# Patient Record
Sex: Male | Born: 1935 | Race: White | Hispanic: No | Smoking: Never smoker
Health system: Southern US, Community
[De-identification: ages and names within clinical notes are randomized; demographics above are authoritative.]

## PROBLEM LIST (undated history)

## (undated) DIAGNOSIS — G629 Polyneuropathy, unspecified: Secondary | ICD-10-CM

## (undated) DIAGNOSIS — IMO0002 Reserved for concepts with insufficient information to code with codable children: Secondary | ICD-10-CM

## (undated) DIAGNOSIS — E7800 Pure hypercholesterolemia, unspecified: Secondary | ICD-10-CM

## (undated) DIAGNOSIS — K219 Gastro-esophageal reflux disease without esophagitis: Secondary | ICD-10-CM

## (undated) DIAGNOSIS — K222 Esophageal obstruction: Secondary | ICD-10-CM

## (undated) DIAGNOSIS — N400 Enlarged prostate without lower urinary tract symptoms: Secondary | ICD-10-CM

## (undated) DIAGNOSIS — Z8719 Personal history of other diseases of the digestive system: Secondary | ICD-10-CM

## (undated) DIAGNOSIS — R42 Dizziness and giddiness: Secondary | ICD-10-CM

## (undated) DIAGNOSIS — C61 Malignant neoplasm of prostate: Secondary | ICD-10-CM

## (undated) DIAGNOSIS — M1990 Unspecified osteoarthritis, unspecified site: Secondary | ICD-10-CM

## (undated) DIAGNOSIS — M810 Age-related osteoporosis without current pathological fracture: Secondary | ICD-10-CM

## (undated) DIAGNOSIS — E559 Vitamin D deficiency, unspecified: Secondary | ICD-10-CM

## (undated) DIAGNOSIS — I482 Chronic atrial fibrillation, unspecified: Secondary | ICD-10-CM

## (undated) HISTORY — DX: Dizziness and giddiness: R42

## (undated) HISTORY — DX: Unspecified osteoarthritis, unspecified site: M19.90

## (undated) HISTORY — PX: OTHER SURGICAL HISTORY: SHX169

## (undated) HISTORY — PX: KNEE ARTHROCENTESIS: SUR44

## (undated) HISTORY — DX: Pure hypercholesterolemia, unspecified: E78.00

## (undated) HISTORY — DX: Reserved for concepts with insufficient information to code with codable children: IMO0002

## (undated) HISTORY — DX: Age-related osteoporosis without current pathological fracture: M81.0

## (undated) HISTORY — DX: Polyneuropathy, unspecified: G62.9

## (undated) HISTORY — DX: Benign prostatic hyperplasia without lower urinary tract symptoms: N40.0

## (undated) HISTORY — PX: TOTAL HIP ARTHROPLASTY: SHX124

## (undated) HISTORY — PX: INGUINAL HERNIA REPAIR: SUR1180

## (undated) HISTORY — DX: Vitamin D deficiency, unspecified: E55.9

## (undated) HISTORY — PX: CARDIAC CATHETERIZATION: SHX172

---

## 1997-12-25 ENCOUNTER — Inpatient Hospital Stay (HOSPITAL_COMMUNITY): Admission: RE | Admit: 1997-12-25 | Discharge: 1997-12-27 | Payer: Self-pay | Admitting: Orthopedic Surgery

## 1997-12-27 ENCOUNTER — Inpatient Hospital Stay (HOSPITAL_COMMUNITY)
Admission: RE | Admit: 1997-12-27 | Discharge: 1998-01-01 | Payer: Self-pay | Admitting: Physical Medicine and Rehabilitation

## 1998-11-26 ENCOUNTER — Ambulatory Visit (HOSPITAL_COMMUNITY): Admission: RE | Admit: 1998-11-26 | Discharge: 1998-11-26 | Payer: Self-pay | Admitting: Cardiology

## 1998-12-06 ENCOUNTER — Ambulatory Visit (HOSPITAL_BASED_OUTPATIENT_CLINIC_OR_DEPARTMENT_OTHER): Admission: RE | Admit: 1998-12-06 | Discharge: 1998-12-06 | Payer: Self-pay | Admitting: Otolaryngology

## 1999-03-30 ENCOUNTER — Ambulatory Visit (HOSPITAL_COMMUNITY): Admission: RE | Admit: 1999-03-30 | Discharge: 1999-03-30 | Payer: Self-pay | Admitting: *Deleted

## 2000-04-17 ENCOUNTER — Encounter: Admission: RE | Admit: 2000-04-17 | Discharge: 2000-04-17 | Payer: Self-pay | Admitting: *Deleted

## 2000-08-07 ENCOUNTER — Ambulatory Visit (HOSPITAL_COMMUNITY): Admission: RE | Admit: 2000-08-07 | Discharge: 2000-08-07 | Payer: Self-pay | Admitting: *Deleted

## 2000-08-07 ENCOUNTER — Encounter: Payer: Self-pay | Admitting: *Deleted

## 2001-04-09 ENCOUNTER — Encounter: Payer: Self-pay | Admitting: Orthopedic Surgery

## 2001-04-12 ENCOUNTER — Encounter: Payer: Self-pay | Admitting: Orthopedic Surgery

## 2001-04-12 ENCOUNTER — Inpatient Hospital Stay (HOSPITAL_COMMUNITY): Admission: RE | Admit: 2001-04-12 | Discharge: 2001-04-16 | Payer: Self-pay | Admitting: Orthopedic Surgery

## 2005-07-20 ENCOUNTER — Emergency Department (HOSPITAL_COMMUNITY): Admission: EM | Admit: 2005-07-20 | Discharge: 2005-07-20 | Payer: Self-pay | Admitting: Emergency Medicine

## 2005-11-19 ENCOUNTER — Ambulatory Visit (HOSPITAL_COMMUNITY): Admission: RE | Admit: 2005-11-19 | Discharge: 2005-11-19 | Payer: Self-pay | Admitting: *Deleted

## 2006-10-23 ENCOUNTER — Encounter: Admission: RE | Admit: 2006-10-23 | Discharge: 2006-10-23 | Payer: Self-pay | Admitting: Orthopaedic Surgery

## 2006-10-27 ENCOUNTER — Ambulatory Visit (HOSPITAL_BASED_OUTPATIENT_CLINIC_OR_DEPARTMENT_OTHER): Admission: RE | Admit: 2006-10-27 | Discharge: 2006-10-27 | Payer: Self-pay | Admitting: Orthopaedic Surgery

## 2008-05-05 HISTORY — PX: CHOLECYSTECTOMY: SHX55

## 2009-03-05 ENCOUNTER — Ambulatory Visit (HOSPITAL_COMMUNITY): Admission: RE | Admit: 2009-03-05 | Discharge: 2009-03-06 | Payer: Self-pay | Admitting: Urology

## 2009-03-05 ENCOUNTER — Encounter: Payer: Self-pay | Admitting: Urology

## 2009-12-09 ENCOUNTER — Inpatient Hospital Stay (HOSPITAL_COMMUNITY): Admission: EM | Admit: 2009-12-09 | Discharge: 2009-12-15 | Payer: Self-pay | Admitting: Emergency Medicine

## 2009-12-10 ENCOUNTER — Encounter (INDEPENDENT_AMBULATORY_CARE_PROVIDER_SITE_OTHER): Payer: Self-pay | Admitting: Cardiology

## 2009-12-12 ENCOUNTER — Encounter (INDEPENDENT_AMBULATORY_CARE_PROVIDER_SITE_OTHER): Payer: Self-pay | Admitting: Cardiology

## 2010-07-19 LAB — CBC
Hemoglobin: 11.3 g/dL — ABNORMAL LOW (ref 13.0–17.0)
Hemoglobin: 13.6 g/dL (ref 13.0–17.0)
Hemoglobin: 14.7 g/dL (ref 13.0–17.0)
MCH: 32.6 pg (ref 26.0–34.0)
MCH: 32.7 pg (ref 26.0–34.0)
MCHC: 33.2 g/dL (ref 30.0–36.0)
MCHC: 33.6 g/dL (ref 30.0–36.0)
MCHC: 33.8 g/dL (ref 30.0–36.0)
MCV: 97.7 fL (ref 78.0–100.0)
MCV: 98.1 fL (ref 78.0–100.0)
MCV: 98.8 fL (ref 78.0–100.0)
Platelets: 155 10*3/uL (ref 150–400)
Platelets: 188 10*3/uL (ref 150–400)
Platelets: 213 10*3/uL (ref 150–400)
RBC: 3.45 MIL/uL — ABNORMAL LOW (ref 4.22–5.81)
RBC: 3.71 MIL/uL — ABNORMAL LOW (ref 4.22–5.81)
RBC: 4.16 MIL/uL — ABNORMAL LOW (ref 4.22–5.81)
RBC: 4.33 MIL/uL (ref 4.22–5.81)
RDW: 14.1 % (ref 11.5–15.5)
RDW: 14.4 % (ref 11.5–15.5)
WBC: 11.2 10*3/uL — ABNORMAL HIGH (ref 4.0–10.5)
WBC: 16.1 10*3/uL — ABNORMAL HIGH (ref 4.0–10.5)
WBC: 19.4 10*3/uL — ABNORMAL HIGH (ref 4.0–10.5)
WBC: 23.8 10*3/uL — ABNORMAL HIGH (ref 4.0–10.5)

## 2010-07-19 LAB — POCT CARDIAC MARKERS
CKMB, poc: 2.3 ng/mL (ref 1.0–8.0)
Myoglobin, poc: 159 ng/mL (ref 12–200)
Troponin i, poc: <0.05 ng/mL (ref 0.00–0.09)

## 2010-07-19 LAB — DIFFERENTIAL
Basophils Absolute: 0 10*3/uL (ref 0.0–0.1)
Basophils Absolute: 0 10*3/uL (ref 0.0–0.1)
Basophils Relative: 0 % (ref 0–1)
Basophils Relative: 0 % (ref 0–1)
Basophils Relative: 0 % (ref 0–1)
Eosinophils Absolute: 0 10*3/uL (ref 0.0–0.7)
Eosinophils Absolute: 0.1 10*3/uL (ref 0.0–0.7)
Eosinophils Absolute: 0.1 10*3/uL (ref 0.0–0.7)
Eosinophils Relative: 0 % (ref 0–5)
Eosinophils Relative: 1 % (ref 0–5)
Eosinophils Relative: 1 % (ref 0–5)
Lymphocytes Relative: 12 % (ref 12–46)
Lymphocytes Relative: 13 % (ref 12–46)
Lymphocytes Relative: 7 % — ABNORMAL LOW (ref 12–46)
Lymphs Abs: 1.4 10*3/uL (ref 0.7–4.0)
Lymphs Abs: 1.7 10*3/uL (ref 0.7–4.0)
Monocytes Absolute: 1.4 10*3/uL — ABNORMAL HIGH (ref 0.1–1.0)
Monocytes Absolute: 1.5 10*3/uL — ABNORMAL HIGH (ref 0.1–1.0)
Monocytes Relative: 8 % (ref 3–12)
Monocytes Relative: 9 % (ref 3–12)
Neutro Abs: 16.4 10*3/uL — ABNORMAL HIGH (ref 1.7–7.7)
Neutro Abs: 20 10*3/uL — ABNORMAL HIGH (ref 1.7–7.7)
Neutro Abs: 8.7 10*3/uL — ABNORMAL HIGH (ref 1.7–7.7)
Neutrophils Relative %: 84 % — ABNORMAL HIGH (ref 43–77)
Neutrophils Relative %: 84 % — ABNORMAL HIGH (ref 43–77)

## 2010-07-19 LAB — BASIC METABOLIC PANEL
Chloride: 101 mEq/L (ref 96–112)
GFR calc non Af Amer: >60 mL/min (ref >60)
Glucose, Bld: 144 mg/dL — ABNORMAL HIGH (ref 70–99)
Potassium: 3.6 mEq/L (ref 3.5–5.1)
Sodium: 139 mEq/L (ref 135–145)

## 2010-07-19 LAB — COMPREHENSIVE METABOLIC PANEL
ALT: 34 U/L (ref 0–53)
ALT: 36 U/L (ref 0–53)
AST: 40 U/L — ABNORMAL HIGH (ref 0–37)
Albumin: 2.3 g/dL — ABNORMAL LOW (ref 3.5–5.2)
Albumin: 2.7 g/dL — ABNORMAL LOW (ref 3.5–5.2)
Alkaline Phosphatase: 66 U/L (ref 39–117)
Alkaline Phosphatase: 87 U/L (ref 39–117)
BUN: 13 mg/dL (ref 6–23)
BUN: 16 mg/dL (ref 6–23)
CO2: 27 mEq/L (ref 19–32)
Calcium: 8.2 mg/dL — ABNORMAL LOW (ref 8.4–10.5)
Calcium: 8.9 mg/dL (ref 8.4–10.5)
Chloride: 100 mEq/L (ref 96–112)
Chloride: 106 mEq/L (ref 96–112)
Creatinine, Ser: 1.13 mg/dL (ref 0.4–1.5)
Creatinine, Ser: 1.29 mg/dL (ref 0.4–1.5)
GFR calc Af Amer: >60 mL/min (ref >60)
GFR calc Af Amer: >60 mL/min (ref >60)
GFR calc non Af Amer: 53 mL/min — ABNORMAL LOW (ref >60)
GFR calc non Af Amer: >60 mL/min (ref >60)
Glucose, Bld: 107 mg/dL — ABNORMAL HIGH (ref 70–99)
Potassium: 3.3 mEq/L — ABNORMAL LOW (ref 3.5–5.1)
Potassium: 3.4 mEq/L — ABNORMAL LOW (ref 3.5–5.1)
Sodium: 133 mEq/L — ABNORMAL LOW (ref 135–145)
Sodium: 139 mEq/L (ref 135–145)
Sodium: 142 mEq/L (ref 135–145)
Total Bilirubin: 0.5 mg/dL (ref 0.3–1.2)
Total Bilirubin: 1.9 mg/dL — ABNORMAL HIGH (ref 0.3–1.2)
Total Protein: 7 g/dL (ref 6.0–8.3)

## 2010-07-19 LAB — CARDIAC PANEL(CRET KIN+CKTOT+MB+TROPI)
CK, MB: 4.2 ng/mL — ABNORMAL HIGH (ref 0.3–4.0)
Troponin I: 0.03 ng/mL (ref 0.00–0.06)
Troponin I: 0.03 ng/mL (ref 0.00–0.06)

## 2010-07-19 LAB — PROTIME-INR
INR: 1.48 (ref 0.00–1.49)
INR: 1.89 — ABNORMAL HIGH (ref 0.00–1.49)
INR: 2.56 — ABNORMAL HIGH (ref 0.00–1.49)
Prothrombin Time: 16.8 seconds — ABNORMAL HIGH (ref 11.6–15.2)
Prothrombin Time: 34.5 seconds — ABNORMAL HIGH (ref 11.6–15.2)

## 2010-07-19 LAB — HEPATIC FUNCTION PANEL
Albumin: 3.4 g/dL — ABNORMAL LOW (ref 3.5–5.2)
Bilirubin, Direct: 0.1 mg/dL (ref 0.0–0.3)
Total Bilirubin: 0.6 mg/dL (ref 0.3–1.2)

## 2010-07-19 LAB — CK TOTAL AND CKMB (NOT AT ARMC): CK, MB: 4.5 ng/mL — ABNORMAL HIGH (ref 0.3–4.0)

## 2010-07-19 LAB — HEMOGLOBIN A1C
Hgb A1c MFr Bld: 6.2 % — ABNORMAL HIGH (ref <5.7)
Mean Plasma Glucose: 131 mg/dL — ABNORMAL HIGH (ref <117)

## 2010-07-19 LAB — LIPASE, BLOOD: Lipase: 24 U/L (ref 11–59)

## 2010-08-07 LAB — APTT: aPTT: 26 seconds (ref 24–37)

## 2010-08-07 LAB — PROTIME-INR: INR: 1.13 (ref 0.00–1.49)

## 2010-08-08 LAB — CBC
HCT: 43.2 % (ref 39.0–52.0)
Hemoglobin: 14.7 g/dL (ref 13.0–17.0)
MCHC: 34 g/dL (ref 30.0–36.0)
RBC: 4.33 MIL/uL (ref 4.22–5.81)

## 2010-09-17 NOTE — Op Note (Signed)
[name], George [name]              ACCOUNT NO.:  [id number]   MEDICAL RECORD NO.:  [id number]          PATIENT TYPE:  AMB   LOCATION:  DSC                          FACILITY:  [facility]   PHYSICIAN:  Vanita Panda. Magnus Ivan, M.D.DATE OF BIRTH:  October 08, 1923   DATE OF PROCEDURE:  10/27/2006  DATE OF DISCHARGE:                               OPERATIVE REPORT   PREOPERATIVE DIAGNOSIS:  Recurrent right elbow olecranon bursitis with  prominent olecranon bone spurring.   POSTOPERATIVE DIAGNOSIS:  Recurrent right elbow olecranon bursitis with  prominent olecranon bone spurring.   PROCEDURE:  Right elbow olecranon bursectomy and excision of prominent  bone spur.   SURGEON:  Doneen Poisson, MD   ANESTHESIA:  1. Right arm Bier block.  2. IV sedation.   BLOOD LOSS:  Minimal.   COMPLICATIONS:  None.   INDICATIONS:  Briefly [name] is an 77 year old gentleman with  recurrent bursitis involving his right elbow.  He has had this drained  in the past and he still had a recurrence.  He has a prominent bone spur  in this area and he wished to proceed with operative intervention  because he did not want to continue to have this aspirated and it recur.  The risks, benefits surgery were explained and well understood.  He  agreed proceed with surgery.   PROCEDURE DESCRIPTION:  After informed consent obtained, appropriate  right arm was marked.  He was brought the operating room, placed supine  on operating table.  A tourniquet placed on his upper arm and Bier block  was obtained by anesthesia.  His arm was prepped and draped DuraPrep and  sterile drapes.  Prior to make an incision time-out was called to  identify the correct patient correct extremity.  I then pinched on the  arm and he did not feel this.  Incision made directly over the olecranon  bursa and bursal fluid was removed easily.  I then excised the bursa in  its entirety.  I did not dissect over to with the area of the ulnar  nerve.  I did remove prominent bone spur using a rongeur.  Once this was  done, the wound was copiously irrigated.  I closed the deep tissue with  interrupted 2-0 Vicryl suture followed by interrupted 3-0 nylon on the  skin.  Xeroform followed by well-padded sterile dressing was applied.  Prior to applying the dressing I did infiltrate the wound with 0.25%  Marcaine.  Again the sterile  dressing was applied.  The tourniquet was let down at 30 minutes of  tourniquet time total, the fingers did pink nicely.  There no  complications noted.  The patient was taken recovery room stable  condition.  Follow-up in office in 2 weeks and to remove the dressing in  2 to 3 days.      Vanita Panda. Magnus Ivan, M.D.  Electronically Signed     CYB/MEDQ  D:  10/27/2006  T:  10/28/2006  Job:  [id number]

## 2010-09-20 NOTE — H&P (Signed)
[facility]  Patient:    George [name], George [name] Visit Number: [id number] MRN: [id number]          Service Type: Attending:  Ollen Gross, M.D. Dictated by:   Ralene Bathe, P.A. Adm. Date:  04/12/01                           History and Physical  DATE OF BIRTH:  2023/12/08  CHIEF COMPLAINT:  Right hip pain.  HISTORY OF PRESENT ILLNESS:  Mr. Latin is a 77 year old male who is well-known to our practice.  He had previously undergone left total hip arthroplasty two to three years ago.  He has progressed well with that, however, has had increasing discomfort into his right hip.  He is having difficulty ambulating and is having rest pain as well as start up pain.  He wishes to proceed with right total hip arthroplasty at this point.  Physical examination shows significant decrease in range of motion, markedly antalgic gait, and about 3/4 inch right leg shortening when compared to left, with x-rays demonstrating significant degenerative changes with large inferior osteophytes and joint space narrowing.  At this time he wishes to proceed with total hip arthroplasty as indicated.  PAST MEDICAL HISTORY: 1. History of intermittent atrial fibrillation on chronic Coumadin.  He is    stopping five days preoperative. 2. History of gastrointestinal bleed.  PAST SURGICAL HISTORY: 1. Lumbar surgery. 2. Right knee arthroscopy. 3. Left total hip arthroplasty in 1999.  ALLERGIES:  ASPIRIN intolerance and NSAID intolerance due to GI complications.  MEDICATIONS: 1. Zantac 150 mg b.i.d. 2. Lipitor 10 mg every-other-day 3. Coumadin 5 mg q.d. 4. Tylenol p.r.n.  FAMILY HISTORY:  Significant for father with cancer.  SOCIAL HISTORY:  He does not drink nor smoke.  He lives alone.  He did stay in rehabilitation unit postoperatively after his last hip.  PRIMARY CARE PHYSICIAN:  Dr. Lilia Pro, M.D.  Dr. Johnsie Kindred wrote and gave medical clearance for the  procedure.  CARDIOLOGIST:  Aram Candela. Aleen Campi, M.D.  REVIEW OF SYSTEMS:  GENERAL:  The patient denies any recent fevers or chills, night sweats, bleeding tendencies.  CNS:  No blurred or double vision, seizure, or paralysis.  RESPIRATORY:  No shortness of breath, productive cough, hemoptysis.  CARDIOVASCULAR:  No chest pain, angina, orthopnea.  His last atrial fibrillation episode was around three months ago.  GI:  No nausea or vomiting, constipation, melena, or bloody stools.  GU:  No dysuria, hematuria.  MUSCULOSKELETAL:  As pertinent in present illness.  PHYSICAL EXAMINATION:  VITAL SIGNS:  Pulse 72, respirations 12, and blood pressure 152/72.  GENERAL:  Well-developed, well-nourished, 77 year old male who walks with antalgic gait.  HEENT:  Normocephalic.  Extraocular muscles are intact.  NECK:  Supple with no lymphadenopathy or carotid bruits.  LUNGS:  Clear to auscultation.  No rales or rhonchi.  HEART:  Regular rate and rhythm today on exam.  No murmurs appreciated. ABDOMEN:  Soft and nontender.  EXTREMITIES:  As in history of present illness.  Pulses neurovascularly intact in bilateral lower extremities and no pitting edema noted.  IMPRESSION: 1. End-stage osteoarthritis of right hip. 2. History of intermittent atrial fibrillation. 3. History of gastrointestinal bleed, remote.  PLAN:  He will stop his Coumadin five days preoperatively in preparation his surgery.  Dr. Lilia Pro, M.D. is his medical physician and will call her and alert her about his admission.  Will call her if needed.  Will  get a rehab consult on his as well postoperatively. Dictated by:   Ralene Bathe, P.A. Attending:  Ollen Gross, M.D. DD:  04/06/01 TD:  04/06/01 Job: [id number]

## 2010-09-20 NOTE — Discharge Summary (Signed)
[facility]  Patient:    George [name], George [name] Visit Number: [id number] MRN: [id number]          Service Type: SUR Location: 4W 0482 01 Attending Physician:  Loanne Drilling Dictated by:   Dorie Rank, P.A. Admit Date:  04/12/2001 Discharge Date: 04/16/2001                             Discharge Summary  ADMISSION DIAGNOSES: 1. Osteoarthritis to the right hip. 2. History of intermittent atrial fibrillation. 3. History of gastrointestinal bleed, remote.  DISCHARGE DIAGNOSES: 1. History of osteoarthritis, right hip, status post right total hip    arthroplasty. 2. Postoperative hemorrhagic anemia, stable. 3. History of intermittent atrial fibrillation. 4. History of remote gastrointestinal bleed.  PROCEDURE:  On April 12, 2001, the patient underwent a right total hip arthroplasty.  Surgeon was Dr. Ollen Gross, assistant Haze Rushing, P.A.-C. Anesthesia spinal with no complications.  CONSULTATION:  Physical medicine and rehabilitation.  HISTORY OF PRESENT ILLNESS:  George [name] is a pleasant, 77 year old male well-known to our practice who had previously undergone a left total hip arthroplasty two to three years ago.  He has progressed well with that, however, he has had increasing discomfort to his right hip.  He was having difficulty with ambulation and was noted in the office to be having pain at rest as well.  On exam, he had significant decrease in range of motion with marked antalgic gait with about 3/4-inch right leg shortening when compared to the left.  Radiographs in the office demonstrated significant degenerative changes with large inferior osteophytes and joint space narrowing.  It was felt that he would benefit from undergoing a total hip arthroplasty.  The risks and benefits, as well as the procedure, were discussed with the patient and he agreed to proceed.  HOSPITAL COURSE:  The patient had the above-stated surgery on April 12, 2001, without difficulty.  While in the operating room, a Hemovac drain was placed into the incision.  This was discontinued on postop day #1 without difficulty.  The wound was dressed in a sterile fashion while in the operating room.  It was clean, dry and intact on postop day #1.  On postop day #2, it was changed and daily thereafter.  It was found to be free of any erythema or drainage.  The patient resumed his Coumadin therapy for DVT prophylaxis as well as his chronic atrial fibrillation.  This was dosed and monitored by the pharmacy throughout his hospital stay.  Appropriate IV antibiotics were used postoperatively for infection control.  While in the operating room, a Foley catheter was placed into the bladder.  This was discontinued.  On postop day #2, he was voiding without difficulty throughout the remainder of his hospital stay.  Postoperatively, H&H were checked daily for three days and found to be stable.  BMET was checked on postop day #1 and found to be stable.  He was placed on a partial weightbearing status to the right lower extremity.  He progressed well with physical therapy and occupational therapy.  He was able to maintain this weightbearing status.  His home medications of Zantac and Lipitor were resumed.  He tolerated p.o. intake postoperatively.  He did have bowel movements prior to discharge.  He utilized knee high TED hose as well for DVT prophylaxis.  Rehabilitation consult was obtained for a possible transfer to rehabilitation, however, he progressed so well with physical  therapy, it was felt he would be stable for discharge home.  Initially, the patient utilized PCA and morphine.  However, on postop day #2, he was utilizing p.o. pain medications throughout the remainder of his hospital stay. On April 16, 2001, he was felt to be medically and orthopedically stable for discharge.  LABORATORY DATA AND X-RAY FINDINGS:  Preop H&H on April 09, 2001, was  14.6 and 42.8 respectively.  On April 14, 2001, hemoglobin 10.9 and hematocrit 32.1.  On April 15, 2001, hemoglobin 11.4, hematocrit 33.5.  Coagulation studies within normal limits on April 12, 2001.  On April 14, 2001, PT 15.6, INR 1.3.  On April 16, 2001, PT 18.5, INR 1.8.  UA on April 09, 2001, was negative other than trace ketones.  Blood type on December 6 and December 9, was O positive.  On April 09, 2001, preop chest x-ray showed no acute disease.  Two-view hip x-ray on the right showed 0 degenerative arthritis of the right hip. Cardiology with no tracings available in the chart at the time of this dictation.  CONDITION ON DISCHARGE:  Improved.  SPECIAL INSTRUCTIONS:  The patient was to have home health PT with Genevieve Norlander as well as Coumadin therapy.  He was to have his protime on Monday with [facility] who was to fax this to Dr. Doreen Beam office who will manage his Coumadin with Genevieve Norlander doing the draws.  Shower on postop day #5.  ACTIVITY:  He will have 50% partial weightbearing to the right lower extremity.  Total joint precautions.  DIET:  Regular diet.  WOUND CARE:  Daily dressing changes.  DISCHARGE MEDICATIONS: 1. Coumadin, per pharmacy. 2. Percocet. 3. Robaxin. 4. Resume home medications. 5. Over-the-counter stool softeners and Dulcolax as needed for constipation.  FOLLOWUP:  Follow up in two weeks from surgery with Dr. Lequita Halt, call for appointment. Dictated by:   Dorie Rank, P.A. Attending Physician:  Loanne Drilling DD:  05/12/01 TD:  05/12/01 Job: [id number]

## 2010-09-20 NOTE — Op Note (Signed)
George [name], George [name]              ACCOUNT NO.:  [id number]   MEDICAL RECORD NO.:  [id number]          PATIENT TYPE:  AMB   LOCATION:  ENDO                         FACILITY:  [facility]   PHYSICIAN:  Georgiana Spinner, M.D.    DATE OF BIRTH:  1924/05/03   DATE OF PROCEDURE:  11/19/2005  DATE OF DISCHARGE:                                 OPERATIVE REPORT   PROCEDURE:  Colonoscopy.   ENDOSCOPIST:  Georgiana Spinner, M.D.   INDICATIONS:  Colon cancer screening.   ANESTHESIA:  Fentanyl 50 mcg, Versed 4 mg.   PROCEDURE:  With the patient mildly sedated in the left lateral decubitus  position, the Olympus videoscopic colonoscope was inserted into the rectum  and passed under direct vision to the cecum, identified by ileocecal valve  and appendiceal orifice, both of which were photographed.  From this point,  the colonoscope was slowly withdrawn, taking circumferential views of  colonic mucosa, stopping to photograph diverticulosis seen in the sigmoid  colon, until we reached the rectum, which appeared normal on direct and  showed hemorrhoids on retroflexed view.  The endoscope was straightened and  withdrawn.  The patient's vital signs and pulse oximetry remained stable.  The patient tolerated the procedure well without apparent complications.   FINDINGS:  Internal hemorrhoids and diverticulosis of the sigmoid colon.  Otherwise, an unremarkable colonoscopic examination to cecum.   PLAN:  Consider repeat examination in 5-10 years.           ______________________________  Georgiana Spinner, M.D.     GMO/MEDQ  D:  11/19/2005  T:  11/19/2005  Job:  [id number]

## 2010-09-20 NOTE — Procedures (Signed)
[facility]  Patient:    George [name], George [name]                     MRN: [id number] Proc. Date: 08/07/00 Adm. Date:  [id number] Attending:  Sabino Gasser                           Procedure Report  PROCEDURE PERFORMED:  Endoscopy with dilation.  ENDOSCOPIST:  Sabino Gasser, M.D.  INDICATIONS FOR PROCEDURE:  Dysphagia.  ANESTHESIA:  Demerol 50 mg, Versed 3 mg.  DESCRIPTION OF PROCEDURE:  With the patient mildly sedated in the left lateral decubitus position, the Olympus video endoscope was inserted in the mouth and passed under direct vision through the esophagus, which appeared normal except for a distal stricture into a hiatal hernia sac, stomach, fundus, body, antrum and duodenal bulb visualized as was the second portion of the duodenum all of which appeared normal.  Photographs were taken.  From this point, the endoscope was slowly withdrawn taking circumferential views of the entire duodenal mucosa until the endoscope had been pulled back into the stomach and placed on retroflexion to view the stomach from below and a hiatal hernia was seen and photographed.  The endoscope was then straightened and a guide wire was passed.  The endoscope was removed taking circumferential views of the remaining gastric and esophageal mucosa.  Subsequently, over the guide wire with fluoroscopic control, Savary 14, 16, and 18 dilators were passed easily. ____________ The guide wire was removed.  The endoscope was reinserted and there was some bleeding seen as to be expected.  The endoscope was then withdrawn.  The patients vital signs and pulse oximeter remained stable.  The patient tolerated the procedure well without apparent complications.  FINDINGS:  Stricture of the distal esophagus above a hiatal hernia.  Await clinical  response to therapy.  Have patient follow up with me as an outpatient.  Clear liquids today, advance diet tomorrow.DD:  08/07/00 TD:  08/07/00 Job:  [id number]

## 2010-09-20 NOTE — Op Note (Signed)
[facility]  Patient:    JAYKO, VOORHEES Visit Number: [id number] MRN: [id number]          Service Type: SUR Location: 4W 6213 01 Attending Physician:  Loanne Drilling Dictated by:   Ollen Gross, M.D. Proc. Date: 04/12/01 Admit Date:  04/12/2001                             Operative Report  PREOPERATIVE DIAGNOSIS:  Osteoarthritis, right hip.  POSTOPERATIVE DIAGNOSIS:  Osteoarthritis, right hip.  PROCEDURE:  Right total hip arthroplasty.  SURGEON:  Ollen Gross, M.D.  ASSISTANT:  Marcie Bal. Troncale, P.A.-C.  ANESTHESIA:  Spinal.  ESTIMATED BLOOD LOSS:  300 cc.  DRAIN:  Hemovac x 1.  COMPLICATIONS:  None.  CONDITION:  Stable to recovery.  BRIEF CLINICAL NOTE:  Mr. Bufkin is a 77 year old male with severe osteoarthritis of the right hip with pain refractory to nonoperative management. He presents now for right total hip arthroplasty.  PROCEDURE IN DETAIL:  After successful administration of spinal anesthetic, the patient was placed in a left lateral decubitus position with the right side up and held with the hip positioner. Right lower extremity is isolated from its perineum-plastic drapes, and prepped and draped in the usual sterile fashion. Standard _______ incision was made. Skin cut with #10 blade through the subcutaneous tissue to the level of fascia lata which was incised in line with the skin incision. Short external rotators were isolated off the femur after the sciatic nerve was palpated and protected. Capsulectomy was performed, hip dislocated, and the center of the femoral hear marked. Trial prosthesis was placed such that the center of the trial head corresponded to the level of the center of his native femoral head. Osteotomy line was then marked on the femoral neck and osteotomy made with an oscillating saw.  Femur was retracted anteriorly, the anterior capsule was removed, and the acetabular exposure obtained. He  had a lot of osteophytes to remove and in reaming was initiated with a 51, coursing increments of 2 at the 55, and at the 56 mm Pinnacle acetabular shell was placed. It was transfixed with two dome screws. Trial 32-mm neutral +4 liner was placed.  Femoral preparation was initiated, first with the canal finder, and then the canal was irrigated. Broaching was started with size 1 coursing up to a size 3. The size 3 high offset stem trial was placed with a 32+0 head. Hip was reduced and he had outstanding stability, full extension, full external rotation, 70 degrees flexion, 40 degrees adduction, 90 degrees internal rotation, 90 degrees of flexion, and 90 degrees of internal rotation. In order to equalize his leg length better _______ plating, it was decided to use a 32+5 head. This trial was placed with the same stability parameters and leg lengths were more equalized.  Trial components were removed and a permanent apex hole eliminator was placed into the acetabular shell. Permanent 32-mm neutral +4 liner was placed into the shell. The canal was irrigated. The cement restrictor was placed at the appropriate level and then the canal prepared with pulsatile lavage. Cement was mixed and once ready for implantation was injected into the canal and pressurized. The size 3 Endurance Luster high offset stem was placed in approximately 20 degrees of anteversion. Once the cement was fully hardened, a permanent 32+5 head was placed, hip reduced with the same stability parameters. Wound was copiously irrigated with antibiotic solution and short  external rotators were reattached to the femur through drill holes. The fascia lata was closed over the Hemovac drain with interrupted #1 Vicryl, subcutaneous closed in interrupted #1 and interrupted 2-0 Vicryl, and subcuticular running 4-0 Monocryl. The incision was cleaned and dried and Steri-Strips and a bulky sterile dressing applied. Drain was hooked  to suction. The patient was awakened, transported to recovery in stable condition. Dictated by:   Ollen Gross, M.D. Attending Physician:  Loanne Drilling DD:  04/12/01 TD:  04/12/01 Job: [id number]

## 2011-02-19 LAB — PROTIME-INR: INR: 1

## 2011-02-19 LAB — POCT HEMOGLOBIN-HEMACUE: Hemoglobin: 14.5

## 2011-02-19 LAB — APTT: aPTT: 27

## 2011-05-09 DIAGNOSIS — I4891 Unspecified atrial fibrillation: Secondary | ICD-10-CM | POA: Diagnosis not present

## 2011-05-09 DIAGNOSIS — E785 Hyperlipidemia, unspecified: Secondary | ICD-10-CM | POA: Diagnosis not present

## 2011-05-09 DIAGNOSIS — Z7901 Long term (current) use of anticoagulants: Secondary | ICD-10-CM | POA: Diagnosis not present

## 2011-05-09 DIAGNOSIS — Z8546 Personal history of malignant neoplasm of prostate: Secondary | ICD-10-CM | POA: Diagnosis not present

## 2011-05-14 DIAGNOSIS — M76899 Other specified enthesopathies of unspecified lower limb, excluding foot: Secondary | ICD-10-CM | POA: Diagnosis not present

## 2011-05-14 DIAGNOSIS — M1710 Unilateral primary osteoarthritis, unspecified knee: Secondary | ICD-10-CM | POA: Diagnosis not present

## 2011-05-22 DIAGNOSIS — L821 Other seborrheic keratosis: Secondary | ICD-10-CM | POA: Diagnosis not present

## 2011-05-22 DIAGNOSIS — L570 Actinic keratosis: Secondary | ICD-10-CM | POA: Diagnosis not present

## 2011-05-22 DIAGNOSIS — L738 Other specified follicular disorders: Secondary | ICD-10-CM | POA: Diagnosis not present

## 2011-05-22 DIAGNOSIS — L219 Seborrheic dermatitis, unspecified: Secondary | ICD-10-CM | POA: Diagnosis not present

## 2011-06-06 DIAGNOSIS — Z8546 Personal history of malignant neoplasm of prostate: Secondary | ICD-10-CM | POA: Diagnosis not present

## 2011-06-06 DIAGNOSIS — I4891 Unspecified atrial fibrillation: Secondary | ICD-10-CM | POA: Diagnosis not present

## 2011-06-06 DIAGNOSIS — E785 Hyperlipidemia, unspecified: Secondary | ICD-10-CM | POA: Diagnosis not present

## 2011-06-06 DIAGNOSIS — Z7901 Long term (current) use of anticoagulants: Secondary | ICD-10-CM | POA: Diagnosis not present

## 2011-07-03 DIAGNOSIS — M76899 Other specified enthesopathies of unspecified lower limb, excluding foot: Secondary | ICD-10-CM | POA: Diagnosis not present

## 2011-07-07 DIAGNOSIS — E785 Hyperlipidemia, unspecified: Secondary | ICD-10-CM | POA: Diagnosis not present

## 2011-07-07 DIAGNOSIS — I4891 Unspecified atrial fibrillation: Secondary | ICD-10-CM | POA: Diagnosis not present

## 2011-07-07 DIAGNOSIS — Z7901 Long term (current) use of anticoagulants: Secondary | ICD-10-CM | POA: Diagnosis not present

## 2011-07-07 DIAGNOSIS — Z8546 Personal history of malignant neoplasm of prostate: Secondary | ICD-10-CM | POA: Diagnosis not present

## 2011-08-06 DIAGNOSIS — I4891 Unspecified atrial fibrillation: Secondary | ICD-10-CM | POA: Diagnosis not present

## 2011-08-06 DIAGNOSIS — E785 Hyperlipidemia, unspecified: Secondary | ICD-10-CM | POA: Diagnosis not present

## 2011-08-06 DIAGNOSIS — Z8546 Personal history of malignant neoplasm of prostate: Secondary | ICD-10-CM | POA: Diagnosis not present

## 2011-08-06 DIAGNOSIS — Z7901 Long term (current) use of anticoagulants: Secondary | ICD-10-CM | POA: Diagnosis not present

## 2011-08-19 DIAGNOSIS — M1710 Unilateral primary osteoarthritis, unspecified knee: Secondary | ICD-10-CM | POA: Diagnosis not present

## 2011-08-20 DIAGNOSIS — Z8546 Personal history of malignant neoplasm of prostate: Secondary | ICD-10-CM | POA: Diagnosis not present

## 2011-08-20 DIAGNOSIS — E785 Hyperlipidemia, unspecified: Secondary | ICD-10-CM | POA: Diagnosis not present

## 2011-08-20 DIAGNOSIS — Z7901 Long term (current) use of anticoagulants: Secondary | ICD-10-CM | POA: Diagnosis not present

## 2011-08-20 DIAGNOSIS — I4891 Unspecified atrial fibrillation: Secondary | ICD-10-CM | POA: Diagnosis not present

## 2011-09-09 DIAGNOSIS — R5383 Other fatigue: Secondary | ICD-10-CM | POA: Diagnosis not present

## 2011-09-09 DIAGNOSIS — I4891 Unspecified atrial fibrillation: Secondary | ICD-10-CM | POA: Diagnosis not present

## 2011-09-09 DIAGNOSIS — Z0000 Encounter for general adult medical examination without abnormal findings: Secondary | ICD-10-CM | POA: Diagnosis not present

## 2011-09-09 DIAGNOSIS — M159 Polyosteoarthritis, unspecified: Secondary | ICD-10-CM | POA: Diagnosis not present

## 2011-09-09 DIAGNOSIS — E119 Type 2 diabetes mellitus without complications: Secondary | ICD-10-CM | POA: Diagnosis not present

## 2011-09-09 DIAGNOSIS — E782 Mixed hyperlipidemia: Secondary | ICD-10-CM | POA: Diagnosis not present

## 2011-09-09 DIAGNOSIS — R5381 Other malaise: Secondary | ICD-10-CM | POA: Diagnosis not present

## 2011-09-16 DIAGNOSIS — G609 Hereditary and idiopathic neuropathy, unspecified: Secondary | ICD-10-CM | POA: Diagnosis not present

## 2011-09-16 DIAGNOSIS — J45901 Unspecified asthma with (acute) exacerbation: Secondary | ICD-10-CM | POA: Diagnosis not present

## 2011-09-16 DIAGNOSIS — H908 Mixed conductive and sensorineural hearing loss, unspecified: Secondary | ICD-10-CM | POA: Diagnosis not present

## 2011-09-16 DIAGNOSIS — E782 Mixed hyperlipidemia: Secondary | ICD-10-CM | POA: Diagnosis not present

## 2011-09-16 DIAGNOSIS — I4891 Unspecified atrial fibrillation: Secondary | ICD-10-CM | POA: Diagnosis not present

## 2011-09-16 DIAGNOSIS — M159 Polyosteoarthritis, unspecified: Secondary | ICD-10-CM | POA: Diagnosis not present

## 2011-09-16 DIAGNOSIS — J45909 Unspecified asthma, uncomplicated: Secondary | ICD-10-CM | POA: Diagnosis not present

## 2011-09-17 DIAGNOSIS — Z8546 Personal history of malignant neoplasm of prostate: Secondary | ICD-10-CM | POA: Diagnosis not present

## 2011-09-17 DIAGNOSIS — E785 Hyperlipidemia, unspecified: Secondary | ICD-10-CM | POA: Diagnosis not present

## 2011-09-17 DIAGNOSIS — I4891 Unspecified atrial fibrillation: Secondary | ICD-10-CM | POA: Diagnosis not present

## 2011-09-17 DIAGNOSIS — Z7901 Long term (current) use of anticoagulants: Secondary | ICD-10-CM | POA: Diagnosis not present

## 2011-09-19 DIAGNOSIS — E785 Hyperlipidemia, unspecified: Secondary | ICD-10-CM | POA: Diagnosis not present

## 2011-09-19 DIAGNOSIS — Z8546 Personal history of malignant neoplasm of prostate: Secondary | ICD-10-CM | POA: Diagnosis not present

## 2011-09-19 DIAGNOSIS — I4891 Unspecified atrial fibrillation: Secondary | ICD-10-CM | POA: Diagnosis not present

## 2011-09-19 DIAGNOSIS — Z7901 Long term (current) use of anticoagulants: Secondary | ICD-10-CM | POA: Diagnosis not present

## 2011-10-06 DIAGNOSIS — R972 Elevated prostate specific antigen [PSA]: Secondary | ICD-10-CM | POA: Diagnosis not present

## 2011-10-06 DIAGNOSIS — C61 Malignant neoplasm of prostate: Secondary | ICD-10-CM | POA: Diagnosis not present

## 2011-10-13 DIAGNOSIS — E785 Hyperlipidemia, unspecified: Secondary | ICD-10-CM | POA: Diagnosis not present

## 2011-10-13 DIAGNOSIS — I4891 Unspecified atrial fibrillation: Secondary | ICD-10-CM | POA: Diagnosis not present

## 2011-10-13 DIAGNOSIS — Z8546 Personal history of malignant neoplasm of prostate: Secondary | ICD-10-CM | POA: Diagnosis not present

## 2011-10-13 DIAGNOSIS — Z7901 Long term (current) use of anticoagulants: Secondary | ICD-10-CM | POA: Diagnosis not present

## 2011-10-20 DIAGNOSIS — I4891 Unspecified atrial fibrillation: Secondary | ICD-10-CM | POA: Diagnosis not present

## 2011-10-20 DIAGNOSIS — Z7901 Long term (current) use of anticoagulants: Secondary | ICD-10-CM | POA: Diagnosis not present

## 2011-10-20 DIAGNOSIS — E785 Hyperlipidemia, unspecified: Secondary | ICD-10-CM | POA: Diagnosis not present

## 2011-10-20 DIAGNOSIS — Z8546 Personal history of malignant neoplasm of prostate: Secondary | ICD-10-CM | POA: Diagnosis not present

## 2011-11-17 DIAGNOSIS — Z7901 Long term (current) use of anticoagulants: Secondary | ICD-10-CM | POA: Diagnosis not present

## 2011-11-17 DIAGNOSIS — I4891 Unspecified atrial fibrillation: Secondary | ICD-10-CM | POA: Diagnosis not present

## 2011-11-17 DIAGNOSIS — Z8546 Personal history of malignant neoplasm of prostate: Secondary | ICD-10-CM | POA: Diagnosis not present

## 2011-11-17 DIAGNOSIS — E785 Hyperlipidemia, unspecified: Secondary | ICD-10-CM | POA: Diagnosis not present

## 2011-11-21 DIAGNOSIS — M1710 Unilateral primary osteoarthritis, unspecified knee: Secondary | ICD-10-CM | POA: Diagnosis not present

## 2011-12-01 DIAGNOSIS — Z7901 Long term (current) use of anticoagulants: Secondary | ICD-10-CM | POA: Diagnosis not present

## 2011-12-01 DIAGNOSIS — E785 Hyperlipidemia, unspecified: Secondary | ICD-10-CM | POA: Diagnosis not present

## 2011-12-01 DIAGNOSIS — I4891 Unspecified atrial fibrillation: Secondary | ICD-10-CM | POA: Diagnosis not present

## 2011-12-01 DIAGNOSIS — Z8546 Personal history of malignant neoplasm of prostate: Secondary | ICD-10-CM | POA: Diagnosis not present

## 2011-12-10 DIAGNOSIS — H10409 Unspecified chronic conjunctivitis, unspecified eye: Secondary | ICD-10-CM | POA: Diagnosis not present

## 2011-12-10 DIAGNOSIS — H04129 Dry eye syndrome of unspecified lacrimal gland: Secondary | ICD-10-CM | POA: Diagnosis not present

## 2011-12-10 DIAGNOSIS — H40059 Ocular hypertension, unspecified eye: Secondary | ICD-10-CM | POA: Diagnosis not present

## 2011-12-10 DIAGNOSIS — Z961 Presence of intraocular lens: Secondary | ICD-10-CM | POA: Diagnosis not present

## 2011-12-29 DIAGNOSIS — E785 Hyperlipidemia, unspecified: Secondary | ICD-10-CM | POA: Diagnosis not present

## 2011-12-29 DIAGNOSIS — Z8546 Personal history of malignant neoplasm of prostate: Secondary | ICD-10-CM | POA: Diagnosis not present

## 2011-12-29 DIAGNOSIS — Z7901 Long term (current) use of anticoagulants: Secondary | ICD-10-CM | POA: Diagnosis not present

## 2011-12-29 DIAGNOSIS — I4891 Unspecified atrial fibrillation: Secondary | ICD-10-CM | POA: Diagnosis not present

## 2012-01-02 DIAGNOSIS — M1710 Unilateral primary osteoarthritis, unspecified knee: Secondary | ICD-10-CM | POA: Diagnosis not present

## 2012-01-09 DIAGNOSIS — M1710 Unilateral primary osteoarthritis, unspecified knee: Secondary | ICD-10-CM | POA: Diagnosis not present

## 2012-01-09 DIAGNOSIS — IMO0002 Reserved for concepts with insufficient information to code with codable children: Secondary | ICD-10-CM | POA: Diagnosis not present

## 2012-01-16 DIAGNOSIS — M1710 Unilateral primary osteoarthritis, unspecified knee: Secondary | ICD-10-CM | POA: Diagnosis not present

## 2012-01-26 DIAGNOSIS — E785 Hyperlipidemia, unspecified: Secondary | ICD-10-CM | POA: Diagnosis not present

## 2012-01-26 DIAGNOSIS — Z8546 Personal history of malignant neoplasm of prostate: Secondary | ICD-10-CM | POA: Diagnosis not present

## 2012-01-26 DIAGNOSIS — Z7901 Long term (current) use of anticoagulants: Secondary | ICD-10-CM | POA: Diagnosis not present

## 2012-01-26 DIAGNOSIS — I4891 Unspecified atrial fibrillation: Secondary | ICD-10-CM | POA: Diagnosis not present

## 2012-02-16 DIAGNOSIS — L821 Other seborrheic keratosis: Secondary | ICD-10-CM | POA: Diagnosis not present

## 2012-02-16 DIAGNOSIS — L570 Actinic keratosis: Secondary | ICD-10-CM | POA: Diagnosis not present

## 2012-02-16 DIAGNOSIS — Z85828 Personal history of other malignant neoplasm of skin: Secondary | ICD-10-CM | POA: Diagnosis not present

## 2012-02-16 DIAGNOSIS — C44319 Basal cell carcinoma of skin of other parts of face: Secondary | ICD-10-CM | POA: Diagnosis not present

## 2012-02-23 DIAGNOSIS — Z8546 Personal history of malignant neoplasm of prostate: Secondary | ICD-10-CM | POA: Diagnosis not present

## 2012-02-23 DIAGNOSIS — E785 Hyperlipidemia, unspecified: Secondary | ICD-10-CM | POA: Diagnosis not present

## 2012-02-23 DIAGNOSIS — Z7901 Long term (current) use of anticoagulants: Secondary | ICD-10-CM | POA: Diagnosis not present

## 2012-02-23 DIAGNOSIS — I4891 Unspecified atrial fibrillation: Secondary | ICD-10-CM | POA: Diagnosis not present

## 2012-03-02 DIAGNOSIS — M1710 Unilateral primary osteoarthritis, unspecified knee: Secondary | ICD-10-CM | POA: Diagnosis not present

## 2012-03-02 DIAGNOSIS — M169 Osteoarthritis of hip, unspecified: Secondary | ICD-10-CM | POA: Diagnosis not present

## 2012-03-08 DIAGNOSIS — Z7901 Long term (current) use of anticoagulants: Secondary | ICD-10-CM | POA: Diagnosis not present

## 2012-03-08 DIAGNOSIS — I4891 Unspecified atrial fibrillation: Secondary | ICD-10-CM | POA: Diagnosis not present

## 2012-03-08 DIAGNOSIS — E785 Hyperlipidemia, unspecified: Secondary | ICD-10-CM | POA: Diagnosis not present

## 2012-03-08 DIAGNOSIS — Z8546 Personal history of malignant neoplasm of prostate: Secondary | ICD-10-CM | POA: Diagnosis not present

## 2012-03-09 DIAGNOSIS — E782 Mixed hyperlipidemia: Secondary | ICD-10-CM | POA: Diagnosis not present

## 2012-03-09 DIAGNOSIS — M159 Polyosteoarthritis, unspecified: Secondary | ICD-10-CM | POA: Diagnosis not present

## 2012-03-09 DIAGNOSIS — R5383 Other fatigue: Secondary | ICD-10-CM | POA: Diagnosis not present

## 2012-03-09 DIAGNOSIS — R5381 Other malaise: Secondary | ICD-10-CM | POA: Diagnosis not present

## 2012-03-09 DIAGNOSIS — G609 Hereditary and idiopathic neuropathy, unspecified: Secondary | ICD-10-CM | POA: Diagnosis not present

## 2012-03-10 DIAGNOSIS — M1710 Unilateral primary osteoarthritis, unspecified knee: Secondary | ICD-10-CM | POA: Diagnosis not present

## 2012-03-16 DIAGNOSIS — M810 Age-related osteoporosis without current pathological fracture: Secondary | ICD-10-CM | POA: Diagnosis not present

## 2012-03-16 DIAGNOSIS — I4891 Unspecified atrial fibrillation: Secondary | ICD-10-CM | POA: Diagnosis not present

## 2012-03-16 DIAGNOSIS — G609 Hereditary and idiopathic neuropathy, unspecified: Secondary | ICD-10-CM | POA: Diagnosis not present

## 2012-03-16 DIAGNOSIS — E782 Mixed hyperlipidemia: Secondary | ICD-10-CM | POA: Diagnosis not present

## 2012-03-22 DIAGNOSIS — Z7901 Long term (current) use of anticoagulants: Secondary | ICD-10-CM | POA: Diagnosis not present

## 2012-03-22 DIAGNOSIS — Z8546 Personal history of malignant neoplasm of prostate: Secondary | ICD-10-CM | POA: Diagnosis not present

## 2012-03-22 DIAGNOSIS — E785 Hyperlipidemia, unspecified: Secondary | ICD-10-CM | POA: Diagnosis not present

## 2012-03-22 DIAGNOSIS — I4891 Unspecified atrial fibrillation: Secondary | ICD-10-CM | POA: Diagnosis not present

## 2012-03-22 DIAGNOSIS — K219 Gastro-esophageal reflux disease without esophagitis: Secondary | ICD-10-CM | POA: Diagnosis not present

## 2012-03-29 DIAGNOSIS — Z7901 Long term (current) use of anticoagulants: Secondary | ICD-10-CM | POA: Diagnosis not present

## 2012-03-29 DIAGNOSIS — I4891 Unspecified atrial fibrillation: Secondary | ICD-10-CM | POA: Diagnosis not present

## 2012-03-29 DIAGNOSIS — Z8546 Personal history of malignant neoplasm of prostate: Secondary | ICD-10-CM | POA: Diagnosis not present

## 2012-03-29 DIAGNOSIS — E785 Hyperlipidemia, unspecified: Secondary | ICD-10-CM | POA: Diagnosis not present

## 2012-04-05 DIAGNOSIS — I4891 Unspecified atrial fibrillation: Secondary | ICD-10-CM | POA: Diagnosis not present

## 2012-04-05 DIAGNOSIS — E785 Hyperlipidemia, unspecified: Secondary | ICD-10-CM | POA: Diagnosis not present

## 2012-04-05 DIAGNOSIS — Z7901 Long term (current) use of anticoagulants: Secondary | ICD-10-CM | POA: Diagnosis not present

## 2012-04-05 DIAGNOSIS — Z8546 Personal history of malignant neoplasm of prostate: Secondary | ICD-10-CM | POA: Diagnosis not present

## 2012-04-12 DIAGNOSIS — C61 Malignant neoplasm of prostate: Secondary | ICD-10-CM | POA: Diagnosis not present

## 2012-04-16 DIAGNOSIS — N401 Enlarged prostate with lower urinary tract symptoms: Secondary | ICD-10-CM | POA: Diagnosis not present

## 2012-04-16 DIAGNOSIS — C61 Malignant neoplasm of prostate: Secondary | ICD-10-CM | POA: Diagnosis not present

## 2012-04-16 DIAGNOSIS — N320 Bladder-neck obstruction: Secondary | ICD-10-CM | POA: Diagnosis not present

## 2012-04-29 DIAGNOSIS — J069 Acute upper respiratory infection, unspecified: Secondary | ICD-10-CM | POA: Diagnosis not present

## 2012-05-20 ENCOUNTER — Other Ambulatory Visit: Payer: Self-pay

## 2012-05-20 DIAGNOSIS — D692 Other nonthrombocytopenic purpura: Secondary | ICD-10-CM | POA: Diagnosis not present

## 2012-05-20 DIAGNOSIS — Z85828 Personal history of other malignant neoplasm of skin: Secondary | ICD-10-CM | POA: Diagnosis not present

## 2012-05-20 DIAGNOSIS — L821 Other seborrheic keratosis: Secondary | ICD-10-CM | POA: Diagnosis not present

## 2012-05-20 DIAGNOSIS — L570 Actinic keratosis: Secondary | ICD-10-CM | POA: Diagnosis not present

## 2012-05-20 DIAGNOSIS — D485 Neoplasm of uncertain behavior of skin: Secondary | ICD-10-CM | POA: Diagnosis not present

## 2012-06-11 DIAGNOSIS — M1710 Unilateral primary osteoarthritis, unspecified knee: Secondary | ICD-10-CM | POA: Diagnosis not present

## 2012-08-06 DIAGNOSIS — M1710 Unilateral primary osteoarthritis, unspecified knee: Secondary | ICD-10-CM | POA: Diagnosis not present

## 2012-08-13 DIAGNOSIS — M1710 Unilateral primary osteoarthritis, unspecified knee: Secondary | ICD-10-CM | POA: Diagnosis not present

## 2012-08-19 DIAGNOSIS — M1710 Unilateral primary osteoarthritis, unspecified knee: Secondary | ICD-10-CM | POA: Diagnosis not present

## 2012-08-27 DIAGNOSIS — M47812 Spondylosis without myelopathy or radiculopathy, cervical region: Secondary | ICD-10-CM | POA: Diagnosis not present

## 2012-08-27 DIAGNOSIS — H903 Sensorineural hearing loss, bilateral: Secondary | ICD-10-CM | POA: Diagnosis not present

## 2012-09-13 DIAGNOSIS — I4891 Unspecified atrial fibrillation: Secondary | ICD-10-CM | POA: Diagnosis not present

## 2012-09-13 DIAGNOSIS — M810 Age-related osteoporosis without current pathological fracture: Secondary | ICD-10-CM | POA: Diagnosis not present

## 2012-09-13 DIAGNOSIS — M109 Gout, unspecified: Secondary | ICD-10-CM | POA: Diagnosis not present

## 2012-09-13 DIAGNOSIS — G609 Hereditary and idiopathic neuropathy, unspecified: Secondary | ICD-10-CM | POA: Diagnosis not present

## 2012-09-13 DIAGNOSIS — E782 Mixed hyperlipidemia: Secondary | ICD-10-CM | POA: Diagnosis not present

## 2012-09-20 DIAGNOSIS — M810 Age-related osteoporosis without current pathological fracture: Secondary | ICD-10-CM | POA: Diagnosis not present

## 2012-09-20 DIAGNOSIS — Z23 Encounter for immunization: Secondary | ICD-10-CM | POA: Diagnosis not present

## 2012-09-20 DIAGNOSIS — E782 Mixed hyperlipidemia: Secondary | ICD-10-CM | POA: Diagnosis not present

## 2012-09-20 DIAGNOSIS — M109 Gout, unspecified: Secondary | ICD-10-CM | POA: Diagnosis not present

## 2012-09-20 DIAGNOSIS — H908 Mixed conductive and sensorineural hearing loss, unspecified: Secondary | ICD-10-CM | POA: Diagnosis not present

## 2012-09-20 DIAGNOSIS — M159 Polyosteoarthritis, unspecified: Secondary | ICD-10-CM | POA: Diagnosis not present

## 2012-09-20 DIAGNOSIS — I4891 Unspecified atrial fibrillation: Secondary | ICD-10-CM | POA: Diagnosis not present

## 2012-09-23 ENCOUNTER — Other Ambulatory Visit: Payer: Self-pay

## 2012-09-23 DIAGNOSIS — D0420 Carcinoma in situ of skin of unspecified ear and external auricular canal: Secondary | ICD-10-CM | POA: Diagnosis not present

## 2012-09-23 DIAGNOSIS — Z85828 Personal history of other malignant neoplasm of skin: Secondary | ICD-10-CM | POA: Diagnosis not present

## 2012-09-23 DIAGNOSIS — L570 Actinic keratosis: Secondary | ICD-10-CM | POA: Diagnosis not present

## 2012-09-23 DIAGNOSIS — L219 Seborrheic dermatitis, unspecified: Secondary | ICD-10-CM | POA: Diagnosis not present

## 2012-09-23 DIAGNOSIS — L821 Other seborrheic keratosis: Secondary | ICD-10-CM | POA: Diagnosis not present

## 2012-09-23 DIAGNOSIS — D0439 Carcinoma in situ of skin of other parts of face: Secondary | ICD-10-CM | POA: Diagnosis not present

## 2012-09-24 DIAGNOSIS — E785 Hyperlipidemia, unspecified: Secondary | ICD-10-CM | POA: Diagnosis not present

## 2012-09-24 DIAGNOSIS — Z7901 Long term (current) use of anticoagulants: Secondary | ICD-10-CM | POA: Diagnosis not present

## 2012-09-24 DIAGNOSIS — Z8546 Personal history of malignant neoplasm of prostate: Secondary | ICD-10-CM | POA: Diagnosis not present

## 2012-09-24 DIAGNOSIS — K219 Gastro-esophageal reflux disease without esophagitis: Secondary | ICD-10-CM | POA: Diagnosis not present

## 2012-09-24 DIAGNOSIS — I4891 Unspecified atrial fibrillation: Secondary | ICD-10-CM | POA: Diagnosis not present

## 2012-10-13 DIAGNOSIS — C61 Malignant neoplasm of prostate: Secondary | ICD-10-CM | POA: Diagnosis not present

## 2012-10-20 DIAGNOSIS — C61 Malignant neoplasm of prostate: Secondary | ICD-10-CM | POA: Diagnosis not present

## 2012-10-20 DIAGNOSIS — N320 Bladder-neck obstruction: Secondary | ICD-10-CM | POA: Diagnosis not present

## 2012-10-20 DIAGNOSIS — N401 Enlarged prostate with lower urinary tract symptoms: Secondary | ICD-10-CM | POA: Diagnosis not present

## 2012-11-01 DIAGNOSIS — M159 Polyosteoarthritis, unspecified: Secondary | ICD-10-CM | POA: Diagnosis not present

## 2012-11-01 DIAGNOSIS — M25469 Effusion, unspecified knee: Secondary | ICD-10-CM | POA: Diagnosis not present

## 2012-11-02 DIAGNOSIS — M159 Polyosteoarthritis, unspecified: Secondary | ICD-10-CM | POA: Diagnosis not present

## 2012-11-02 DIAGNOSIS — M1A00X0 Idiopathic chronic gout, unspecified site, without tophus (tophi): Secondary | ICD-10-CM | POA: Diagnosis not present

## 2012-11-02 DIAGNOSIS — M25569 Pain in unspecified knee: Secondary | ICD-10-CM | POA: Diagnosis not present

## 2012-11-03 DIAGNOSIS — M19049 Primary osteoarthritis, unspecified hand: Secondary | ICD-10-CM | POA: Diagnosis not present

## 2012-11-17 DIAGNOSIS — M6530 Trigger finger, unspecified finger: Secondary | ICD-10-CM | POA: Diagnosis not present

## 2012-11-17 DIAGNOSIS — M19049 Primary osteoarthritis, unspecified hand: Secondary | ICD-10-CM | POA: Diagnosis not present

## 2013-03-11 DIAGNOSIS — M1710 Unilateral primary osteoarthritis, unspecified knee: Secondary | ICD-10-CM | POA: Diagnosis not present

## 2013-03-28 DIAGNOSIS — E782 Mixed hyperlipidemia: Secondary | ICD-10-CM | POA: Diagnosis not present

## 2013-03-28 DIAGNOSIS — M810 Age-related osteoporosis without current pathological fracture: Secondary | ICD-10-CM | POA: Diagnosis not present

## 2013-03-28 DIAGNOSIS — M109 Gout, unspecified: Secondary | ICD-10-CM | POA: Diagnosis not present

## 2013-03-28 DIAGNOSIS — G609 Hereditary and idiopathic neuropathy, unspecified: Secondary | ICD-10-CM | POA: Diagnosis not present

## 2013-04-13 DIAGNOSIS — M79609 Pain in unspecified limb: Secondary | ICD-10-CM | POA: Diagnosis not present

## 2013-04-14 DIAGNOSIS — M1710 Unilateral primary osteoarthritis, unspecified knee: Secondary | ICD-10-CM | POA: Diagnosis not present

## 2013-04-18 DIAGNOSIS — E782 Mixed hyperlipidemia: Secondary | ICD-10-CM | POA: Diagnosis not present

## 2013-04-18 DIAGNOSIS — I4891 Unspecified atrial fibrillation: Secondary | ICD-10-CM | POA: Diagnosis not present

## 2013-04-18 DIAGNOSIS — R1310 Dysphagia, unspecified: Secondary | ICD-10-CM | POA: Diagnosis not present

## 2013-04-18 DIAGNOSIS — M159 Polyosteoarthritis, unspecified: Secondary | ICD-10-CM | POA: Diagnosis not present

## 2013-04-19 ENCOUNTER — Other Ambulatory Visit: Payer: Self-pay | Admitting: Internal Medicine

## 2013-04-19 DIAGNOSIS — R1310 Dysphagia, unspecified: Secondary | ICD-10-CM

## 2013-04-21 ENCOUNTER — Ambulatory Visit
Admission: RE | Admit: 2013-04-21 | Discharge: 2013-04-21 | Disposition: A | Discharge status: Home / Self Care | Payer: Medicare Other | Source: Ambulatory Visit | Attending: Internal Medicine | Admitting: Internal Medicine

## 2013-04-21 DIAGNOSIS — R1310 Dysphagia, unspecified: Secondary | ICD-10-CM

## 2013-04-22 DIAGNOSIS — M1710 Unilateral primary osteoarthritis, unspecified knee: Secondary | ICD-10-CM | POA: Diagnosis not present

## 2013-04-25 DIAGNOSIS — M1710 Unilateral primary osteoarthritis, unspecified knee: Secondary | ICD-10-CM | POA: Diagnosis not present

## 2013-05-24 DIAGNOSIS — M1710 Unilateral primary osteoarthritis, unspecified knee: Secondary | ICD-10-CM | POA: Diagnosis not present

## 2013-06-13 DIAGNOSIS — J40 Bronchitis, not specified as acute or chronic: Secondary | ICD-10-CM | POA: Diagnosis not present

## 2013-06-13 DIAGNOSIS — J069 Acute upper respiratory infection, unspecified: Secondary | ICD-10-CM | POA: Diagnosis not present

## 2013-06-13 DIAGNOSIS — I4891 Unspecified atrial fibrillation: Secondary | ICD-10-CM | POA: Diagnosis not present

## 2013-06-13 DIAGNOSIS — J988 Other specified respiratory disorders: Secondary | ICD-10-CM | POA: Diagnosis not present

## 2013-06-15 ENCOUNTER — Other Ambulatory Visit: Payer: Self-pay

## 2013-06-15 DIAGNOSIS — L570 Actinic keratosis: Secondary | ICD-10-CM | POA: Diagnosis not present

## 2013-06-15 DIAGNOSIS — L738 Other specified follicular disorders: Secondary | ICD-10-CM | POA: Diagnosis not present

## 2013-06-15 DIAGNOSIS — C44111 Basal cell carcinoma of skin of unspecified eyelid, including canthus: Secondary | ICD-10-CM | POA: Diagnosis not present

## 2013-06-15 DIAGNOSIS — B351 Tinea unguium: Secondary | ICD-10-CM | POA: Diagnosis not present

## 2013-06-15 DIAGNOSIS — L578 Other skin changes due to chronic exposure to nonionizing radiation: Secondary | ICD-10-CM | POA: Diagnosis not present

## 2013-06-15 DIAGNOSIS — L821 Other seborrheic keratosis: Secondary | ICD-10-CM | POA: Diagnosis not present

## 2013-06-15 DIAGNOSIS — Z85828 Personal history of other malignant neoplasm of skin: Secondary | ICD-10-CM | POA: Diagnosis not present

## 2013-06-27 DIAGNOSIS — T17908A Unspecified foreign body in respiratory tract, part unspecified causing other injury, initial encounter: Secondary | ICD-10-CM | POA: Diagnosis not present

## 2013-06-27 DIAGNOSIS — K222 Esophageal obstruction: Secondary | ICD-10-CM | POA: Diagnosis not present

## 2013-06-27 DIAGNOSIS — R1310 Dysphagia, unspecified: Secondary | ICD-10-CM | POA: Diagnosis not present

## 2013-06-27 DIAGNOSIS — I4891 Unspecified atrial fibrillation: Secondary | ICD-10-CM | POA: Diagnosis not present

## 2013-07-06 DIAGNOSIS — M1710 Unilateral primary osteoarthritis, unspecified knee: Secondary | ICD-10-CM | POA: Diagnosis not present

## 2013-07-18 DIAGNOSIS — R1310 Dysphagia, unspecified: Secondary | ICD-10-CM | POA: Diagnosis not present

## 2013-07-18 DIAGNOSIS — Z85828 Personal history of other malignant neoplasm of skin: Secondary | ICD-10-CM | POA: Diagnosis not present

## 2013-07-18 DIAGNOSIS — C44111 Basal cell carcinoma of skin of unspecified eyelid, including canthus: Secondary | ICD-10-CM | POA: Diagnosis not present

## 2013-07-19 ENCOUNTER — Encounter (HOSPITAL_COMMUNITY): Payer: Self-pay | Admitting: Pharmacy Technician

## 2013-07-26 ENCOUNTER — Encounter (HOSPITAL_COMMUNITY): Payer: Self-pay | Admitting: *Deleted

## 2013-08-02 ENCOUNTER — Ambulatory Visit (HOSPITAL_COMMUNITY): Payer: Medicare Other

## 2013-08-08 ENCOUNTER — Other Ambulatory Visit: Payer: Self-pay | Admitting: Gastroenterology

## 2013-08-08 NOTE — Addendum Note (Signed)
Addended by: Abu Heavin on: 08/08/2013 10:35 AM   Modules accepted: Orders  

## 2013-08-09 ENCOUNTER — Other Ambulatory Visit: Payer: Self-pay | Admitting: Gastroenterology

## 2013-08-10 ENCOUNTER — Encounter (HOSPITAL_COMMUNITY)
Admission: RE | Disposition: A | Discharge status: Home / Self Care | Payer: Self-pay | Source: Ambulatory Visit | Attending: Gastroenterology

## 2013-08-10 ENCOUNTER — Ambulatory Visit (HOSPITAL_COMMUNITY)
Admission: RE | Admit: 2013-08-10 | Discharge: 2013-08-10 | Disposition: A | Discharge status: Home / Self Care | Payer: Medicare Other | Source: Ambulatory Visit | Attending: Gastroenterology | Admitting: Gastroenterology

## 2013-08-10 ENCOUNTER — Encounter (HOSPITAL_COMMUNITY): Payer: Medicare Other | Admitting: Certified Registered Nurse Anesthetist

## 2013-08-10 ENCOUNTER — Encounter (HOSPITAL_COMMUNITY): Payer: Self-pay | Admitting: Certified Registered Nurse Anesthetist

## 2013-08-10 ENCOUNTER — Ambulatory Visit (HOSPITAL_COMMUNITY): Payer: Medicare Other | Admitting: Certified Registered Nurse Anesthetist

## 2013-08-10 DIAGNOSIS — K224 Dyskinesia of esophagus: Secondary | ICD-10-CM | POA: Diagnosis not present

## 2013-08-10 DIAGNOSIS — R1310 Dysphagia, unspecified: Secondary | ICD-10-CM | POA: Insufficient documentation

## 2013-08-10 DIAGNOSIS — Q409 Congenital malformation of upper alimentary tract, unspecified: Secondary | ICD-10-CM | POA: Insufficient documentation

## 2013-08-10 DIAGNOSIS — R002 Palpitations: Secondary | ICD-10-CM | POA: Diagnosis not present

## 2013-08-10 DIAGNOSIS — K222 Esophageal obstruction: Secondary | ICD-10-CM | POA: Diagnosis not present

## 2013-08-10 DIAGNOSIS — K219 Gastro-esophageal reflux disease without esophagitis: Secondary | ICD-10-CM | POA: Insufficient documentation

## 2013-08-10 DIAGNOSIS — K449 Diaphragmatic hernia without obstruction or gangrene: Secondary | ICD-10-CM | POA: Insufficient documentation

## 2013-08-10 HISTORY — DX: Malignant neoplasm of prostate: C61

## 2013-08-10 HISTORY — PX: BALLOON DILATION: SHX5330

## 2013-08-10 HISTORY — PX: ESOPHAGOGASTRODUODENOSCOPY (EGD) WITH PROPOFOL: SHX5813

## 2013-08-10 HISTORY — PX: BOTOX INJECTION: SHX5754

## 2013-08-10 HISTORY — DX: Gastro-esophageal reflux disease without esophagitis: K21.9

## 2013-08-10 SURGERY — ESOPHAGOGASTRODUODENOSCOPY (EGD) WITH PROPOFOL
Anesthesia: Monitor Anesthesia Care

## 2013-08-10 MED ORDER — LACTATED RINGERS IV SOLN
INTRAVENOUS | Status: DC | PRN
  Administered 2013-08-10: 09:00:00 via INTRAVENOUS

## 2013-08-10 MED ORDER — PHENYLEPHRINE HCL 10 MG/ML IJ SOLN
INTRAMUSCULAR | Status: DC | PRN
  Administered 2013-08-10 (×2): 10 ug via INTRAVENOUS

## 2013-08-10 MED ORDER — BUTAMBEN-TETRACAINE-BENZOCAINE 2-2-14 % EX AERO
INHALATION_SPRAY | CUTANEOUS | Status: DC | PRN
  Administered 2013-08-10: 2 via TOPICAL

## 2013-08-10 MED ORDER — FENTANYL CITRATE 0.05 MG/ML IJ SOLN: 25.0000 ug | INTRAMUSCULAR | Status: DC | PRN

## 2013-08-10 MED ORDER — FENTANYL CITRATE 0.05 MG/ML IJ SOLN
INTRAMUSCULAR | Status: DC | PRN
  Administered 2013-08-10: 25 ug via INTRAVENOUS

## 2013-08-10 MED ORDER — PROPOFOL 10 MG/ML IV BOLUS
INTRAVENOUS | Status: AC
  Filled 2013-08-10: qty 20

## 2013-08-10 MED ORDER — SODIUM CHLORIDE 0.9 % IJ SOLN
100.0000 [IU] | Freq: Once | INTRAMUSCULAR | Status: DC
  Filled 2013-08-10: qty 100

## 2013-08-10 MED ORDER — LACTATED RINGERS IV SOLN: INTRAVENOUS | Status: DC

## 2013-08-10 MED ORDER — FENTANYL CITRATE 0.05 MG/ML IJ SOLN
INTRAMUSCULAR | Status: AC
  Filled 2013-08-10: qty 2

## 2013-08-10 MED ORDER — PROPOFOL 10 MG/ML IV BOLUS
INTRAVENOUS | Status: DC | PRN
  Administered 2013-08-10: 20 mg via INTRAVENOUS
  Administered 2013-08-10: 40 mg via INTRAVENOUS
  Administered 2013-08-10: 20 mg via INTRAVENOUS
  Administered 2013-08-10 (×2): 40 mg via INTRAVENOUS

## 2013-08-10 MED ORDER — APIXABAN 5 MG PO TABS: 5.0000 mg | ORAL_TABLET | Freq: Two times a day (BID) | ORAL | Status: AC

## 2013-08-10 MED ORDER — SODIUM CHLORIDE 0.9 % IV SOLN: INTRAVENOUS | Status: DC

## 2013-08-10 MED ORDER — PHENYLEPHRINE 40 MCG/ML (10ML) SYRINGE FOR IV PUSH (FOR BLOOD PRESSURE SUPPORT)
PREFILLED_SYRINGE | INTRAVENOUS | Status: AC
  Filled 2013-08-10: qty 10

## 2013-08-10 SURGICAL SUPPLY — 14 items

## 2013-08-10 NOTE — Anesthesia Preprocedure Evaluation (Addendum)
Anesthesia Evaluation  Patient identified by MRN, date of birth, ID band Patient awake    Reviewed: Allergy & Precautions, H&P , NPO status , Patient's Chart, lab work & pertinent test results  Airway Mallampati: II TM Distance: >3 FB Neck ROM: full    Dental no notable dental hx. (+) Teeth Intact, Dental Advisory Given   Pulmonary neg pulmonary ROS,  breath sounds clear to auscultation  Pulmonary exam normal       Cardiovascular Exercise Tolerance: Good negative cardio ROS  Rhythm:regular Rate:Normal  palpitations   Neuro/Psych negative neurological ROS  negative psych ROS   GI/Hepatic negative GI ROS, Neg liver ROS, GERD-  Medicated and Controlled,  Endo/Other  negative endocrine ROS  Renal/GU negative Renal ROS  negative genitourinary   Musculoskeletal   Abdominal   Peds  Hematology negative hematology ROS (+)   Anesthesia Other Findings   Reproductive/Obstetrics negative OB ROS                          Anesthesia Physical Anesthesia Plan  ASA: III  Anesthesia Plan: MAC   Post-op Pain Management:    Induction:   Airway Management Planned:   Additional Equipment:   Intra-op Plan:   Post-operative Plan:   Informed Consent: I have reviewed the patients History and Physical, chart, labs and discussed the procedure including the risks, benefits and alternatives for the proposed anesthesia with the patient or authorized representative who has indicated his/her understanding and acceptance.   Dental Advisory Given  Plan Discussed with: CRNA and Surgeon  Anesthesia Plan Comments:         Anesthesia Quick Evaluation

## 2013-08-10 NOTE — Anesthesia Postprocedure Evaluation (Signed)
  Anesthesia Post-op Note  Patient: George [name]  Procedure(s) Performed: Procedure(s) (LRB): ESOPHAGOGASTRODUODENOSCOPY (EGD) WITH PROPOFOL (N/A) BALLOON DILATION (N/A) BOTOX INJECTION (N/A)  Patient Location: PACU  Anesthesia Type: MAC  Level of Consciousness: awake and alert   Airway and Oxygen Therapy: Patient Spontanous Breathing  Post-op Pain: mild  Post-op Assessment: Post-op Vital signs reviewed, Patient's Cardiovascular Status Stable, Respiratory Function Stable, Patent Airway and No signs of Nausea or vomiting  Last Vitals:  Filed Vitals:   08/10/13 1040  BP: 144/78  Pulse:   Temp:   Resp: 20    Post-op Vital Signs: stable   Complications: No apparent anesthesia complications

## 2013-08-10 NOTE — Discharge Instructions (Signed)
Endoscopy Care After Please read the instructions outlined below and refer to this sheet in the next few weeks. These discharge instructions provide you with general information on caring for yourself after you leave the hospital. Your doctor may also give you specific instructions. While your treatment has been planned according to the most current medical practices available, unavoidable complications occasionally occur. If you have any problems or questions after discharge, please call Dr. Paulita Fujita [facility]) at [phone].  HOME CARE INSTRUCTIONS Activity  You may resume your regular activity but move at a slower pace for the next 24 hours.   Take frequent rest periods for the next 24 hours.   Walking will help expel (get rid of) the air and reduce the bloated feeling in your abdomen.   No driving for 24 hours (because of the anesthesia (medicine) used during the test).   You may shower.   Do not sign any important legal documents or operate any machinery for 24 hours (because of the anesthesia used during the test).  Nutrition  Drink plenty of fluids.   Soft diet today.  Begin with a light meal and progress to your normal diet.   Avoid alcoholic beverages for 24 hours or as instructed by your caregiver.  Medications Start back Eliquis on Friday, April 10th 2015. You may resume your normal medications unless your caregiver tells you otherwise. What you can expect today  You may experience abdominal discomfort such as a feeling of fullness or "gas" pains.   You may experience a sore throat for 2 to 3 days. This is normal. Gargling with salt water may help this.    SEEK IMMEDIATE MEDICAL CARE IF:  You have excessive nausea (feeling sick to your stomach) and/or vomiting.   You have severe abdominal pain and distention (swelling).   You have trouble swallowing.   You have a temperature over 100 F (37.8 C).   You have rectal bleeding or vomiting of blood.   Document Released: 12/04/2003 Document Revised: 01/01/2011 Document Reviewed: 06/16/2007 [facility] Patient Information 2012 [place].

## 2013-08-10 NOTE — H&P (Signed)
Patient interval history reviewed.  Patient examined again.  There has been no change from documented H/P dated 07/18/13 (scanned into chart from our office) except as documented above.  Assessment:  1.  Dysphagia.  Suspect presbyesophagus versus achalasia.  Multiple other differential considerations can't be entirely excluded.  Plan:  1.  Endoscopy with possible botox injection or possible esophageal dilatation. 2.  Risks (bleeding, infection, bowel perforation that could require surgery, sedation-related changes in cardiopulmonary systems), benefits (identification and possible treatment of source of symptoms, exclusion of certain causes of symptoms), and alternatives (watchful waiting, radiographic imaging studies, empiric medical treatment) of upper endoscopy with possible botox, possible esophageal dilatation (EGD +/- botox, dil) were explained to patient/family in detail and patient wishes to proceed.

## 2013-08-10 NOTE — Transfer of Care (Signed)
Immediate Anesthesia Transfer of Care Note  Patient: George [name]  Procedure(s) Performed: Procedure(s) with comments: ESOPHAGOGASTRODUODENOSCOPY (EGD) WITH PROPOFOL (N/A) BALLOON DILATION (N/A) BOTOX INJECTION (N/A) - + or -      balloon/botox  Patient Location: PACU and Endoscopy Unit  Anesthesia Type:MAC  Level of Consciousness: awake, oriented, lethargic and responds to stimulation  Airway & Oxygen Therapy: Patient Spontanous Breathing and Patient connected to nasal cannula oxygen  Post-op Assessment: Report given to PACU RN, Post -op Vital signs reviewed and stable and Patient moving all extremities  Post vital signs: Reviewed and stable  Complications: No apparent anesthesia complications

## 2013-08-10 NOTE — Op Note (Signed)
[facility] [place], [address]   ENDOSCOPY PROCEDURE REPORT  PATIENT: George [name], George [name].  MR#: [id number] BIRTHDATE: 22-Jun-1923 , 89  yrs. old GENDER: Male ENDOSCOPIST: Arta Silence, MD REFERRED BY:  Merrilee Seashore, M.D. PROCEDURE DATE:  08/10/2013 PROCEDURE:  Savary dilation of esophagus ASA CLASS:     Class III INDICATIONS:  dysphagia, abnormal barium esophagram. MEDICATIONS: MAC sedation, administered by CRNA TOPICAL ANESTHETIC: Cetacaine Spray  DESCRIPTION OF PROCEDURE: After the risks benefits and alternatives of the procedure were thoroughly explained, informed consent was obtained.  The pediatric and adult diagnostic endoscopes were sequentially introduced through the mouth and advanced to the second portion of the duodenum. Without limitations.  The instrument was slowly withdrawn as the mucosa was fully examined.     Findings:  Prominent cricopharyngeal bar.  Tortuous esophagus. Medium-sized hiatal hernia.  Schatzki's ring seen, estimated diameter about 43mm, dilated to 16.5 mm with TTS balloon dilator with some expected bloody show post dilatation.   Remainder of exam to the second portion of the duodenum was normal.             The scope was then withdrawn from the patient and the procedure completed.  ENDOSCOPIC IMPRESSION:     As above. Esophageal ring highly likely accounts for patient's intermittent dysphagia.  RECOMMENDATIONS:     1.  Watch for potential complications of procedure. 2.  Soft diet today. 3.  Chew food slowly and carefully indefinitely. 4.  Restart Eliquis on Friday, 08/12/13. 5.  Await clinical response to esophageal dilatation. 6.  Follow-up with [facility] on as-needed basis.  eSigned:  Arta Silence, MD 08/10/2013 9:59 AM   CC:

## 2013-08-11 ENCOUNTER — Encounter (HOSPITAL_COMMUNITY): Payer: Self-pay | Admitting: Gastroenterology

## 2013-09-13 DIAGNOSIS — M25469 Effusion, unspecified knee: Secondary | ICD-10-CM | POA: Diagnosis not present

## 2013-09-13 DIAGNOSIS — M1710 Unilateral primary osteoarthritis, unspecified knee: Secondary | ICD-10-CM | POA: Diagnosis not present

## 2013-09-13 DIAGNOSIS — M25569 Pain in unspecified knee: Secondary | ICD-10-CM | POA: Diagnosis not present

## 2013-09-16 DIAGNOSIS — M25469 Effusion, unspecified knee: Secondary | ICD-10-CM | POA: Diagnosis not present

## 2013-09-16 DIAGNOSIS — M1710 Unilateral primary osteoarthritis, unspecified knee: Secondary | ICD-10-CM | POA: Diagnosis not present

## 2013-09-16 DIAGNOSIS — M25569 Pain in unspecified knee: Secondary | ICD-10-CM | POA: Diagnosis not present

## 2013-09-20 DIAGNOSIS — I4891 Unspecified atrial fibrillation: Secondary | ICD-10-CM | POA: Diagnosis not present

## 2013-10-10 DIAGNOSIS — M1710 Unilateral primary osteoarthritis, unspecified knee: Secondary | ICD-10-CM | POA: Diagnosis not present

## 2013-10-17 DIAGNOSIS — C61 Malignant neoplasm of prostate: Secondary | ICD-10-CM | POA: Diagnosis not present

## 2013-10-24 DIAGNOSIS — N401 Enlarged prostate with lower urinary tract symptoms: Secondary | ICD-10-CM | POA: Diagnosis not present

## 2013-10-24 DIAGNOSIS — C61 Malignant neoplasm of prostate: Secondary | ICD-10-CM | POA: Diagnosis not present

## 2013-10-26 DIAGNOSIS — M1710 Unilateral primary osteoarthritis, unspecified knee: Secondary | ICD-10-CM | POA: Diagnosis not present

## 2013-11-02 DIAGNOSIS — M1710 Unilateral primary osteoarthritis, unspecified knee: Secondary | ICD-10-CM | POA: Diagnosis not present

## 2013-11-09 DIAGNOSIS — M1710 Unilateral primary osteoarthritis, unspecified knee: Secondary | ICD-10-CM | POA: Diagnosis not present

## 2013-11-14 DIAGNOSIS — M109 Gout, unspecified: Secondary | ICD-10-CM | POA: Diagnosis not present

## 2013-11-14 DIAGNOSIS — E782 Mixed hyperlipidemia: Secondary | ICD-10-CM | POA: Diagnosis not present

## 2013-11-14 DIAGNOSIS — Z1331 Encounter for screening for depression: Secondary | ICD-10-CM | POA: Diagnosis not present

## 2013-11-14 DIAGNOSIS — I4891 Unspecified atrial fibrillation: Secondary | ICD-10-CM | POA: Diagnosis not present

## 2013-11-14 DIAGNOSIS — Z0000 Encounter for general adult medical examination without abnormal findings: Secondary | ICD-10-CM | POA: Diagnosis not present

## 2013-11-16 DIAGNOSIS — M1710 Unilateral primary osteoarthritis, unspecified knee: Secondary | ICD-10-CM | POA: Diagnosis not present

## 2013-11-21 DIAGNOSIS — M159 Polyosteoarthritis, unspecified: Secondary | ICD-10-CM | POA: Diagnosis not present

## 2013-11-21 DIAGNOSIS — E782 Mixed hyperlipidemia: Secondary | ICD-10-CM | POA: Diagnosis not present

## 2013-11-21 DIAGNOSIS — M109 Gout, unspecified: Secondary | ICD-10-CM | POA: Diagnosis not present

## 2013-11-21 DIAGNOSIS — I4891 Unspecified atrial fibrillation: Secondary | ICD-10-CM | POA: Diagnosis not present

## 2013-12-06 DIAGNOSIS — M1710 Unilateral primary osteoarthritis, unspecified knee: Secondary | ICD-10-CM | POA: Diagnosis not present

## 2014-02-06 DIAGNOSIS — J22 Unspecified acute lower respiratory infection: Secondary | ICD-10-CM | POA: Diagnosis not present

## 2014-02-06 DIAGNOSIS — R5383 Other fatigue: Secondary | ICD-10-CM | POA: Diagnosis not present

## 2014-02-06 DIAGNOSIS — R0781 Pleurodynia: Secondary | ICD-10-CM | POA: Diagnosis not present

## 2014-02-06 DIAGNOSIS — R05 Cough: Secondary | ICD-10-CM | POA: Diagnosis not present

## 2014-02-08 DIAGNOSIS — M1711 Unilateral primary osteoarthritis, right knee: Secondary | ICD-10-CM | POA: Diagnosis not present

## 2014-02-08 DIAGNOSIS — M1712 Unilateral primary osteoarthritis, left knee: Secondary | ICD-10-CM | POA: Diagnosis not present

## 2014-02-14 DIAGNOSIS — R0781 Pleurodynia: Secondary | ICD-10-CM | POA: Diagnosis not present

## 2014-02-14 DIAGNOSIS — J22 Unspecified acute lower respiratory infection: Secondary | ICD-10-CM | POA: Diagnosis not present

## 2014-02-14 DIAGNOSIS — M25469 Effusion, unspecified knee: Secondary | ICD-10-CM | POA: Diagnosis not present

## 2014-03-22 DIAGNOSIS — Z23 Encounter for immunization: Secondary | ICD-10-CM | POA: Diagnosis not present

## 2014-04-27 DIAGNOSIS — M1712 Unilateral primary osteoarthritis, left knee: Secondary | ICD-10-CM | POA: Diagnosis not present

## 2014-04-27 DIAGNOSIS — M1711 Unilateral primary osteoarthritis, right knee: Secondary | ICD-10-CM | POA: Diagnosis not present

## 2014-05-15 DIAGNOSIS — E782 Mixed hyperlipidemia: Secondary | ICD-10-CM | POA: Diagnosis not present

## 2014-05-15 DIAGNOSIS — I4891 Unspecified atrial fibrillation: Secondary | ICD-10-CM | POA: Diagnosis not present

## 2014-05-22 DIAGNOSIS — E782 Mixed hyperlipidemia: Secondary | ICD-10-CM | POA: Diagnosis not present

## 2014-05-22 DIAGNOSIS — G609 Hereditary and idiopathic neuropathy, unspecified: Secondary | ICD-10-CM | POA: Diagnosis not present

## 2014-05-22 DIAGNOSIS — I481 Persistent atrial fibrillation: Secondary | ICD-10-CM | POA: Diagnosis not present

## 2014-05-22 DIAGNOSIS — M150 Primary generalized (osteo)arthritis: Secondary | ICD-10-CM | POA: Diagnosis not present

## 2014-05-24 DIAGNOSIS — H01025 Squamous blepharitis left lower eyelid: Secondary | ICD-10-CM | POA: Diagnosis not present

## 2014-05-24 DIAGNOSIS — Z961 Presence of intraocular lens: Secondary | ICD-10-CM | POA: Diagnosis not present

## 2014-05-24 DIAGNOSIS — H35372 Puckering of macula, left eye: Secondary | ICD-10-CM | POA: Diagnosis not present

## 2014-05-24 DIAGNOSIS — H01021 Squamous blepharitis right upper eyelid: Secondary | ICD-10-CM | POA: Diagnosis not present

## 2014-05-24 DIAGNOSIS — H01022 Squamous blepharitis right lower eyelid: Secondary | ICD-10-CM | POA: Diagnosis not present

## 2014-05-24 DIAGNOSIS — H40053 Ocular hypertension, bilateral: Secondary | ICD-10-CM | POA: Diagnosis not present

## 2014-05-24 DIAGNOSIS — H01024 Squamous blepharitis left upper eyelid: Secondary | ICD-10-CM | POA: Diagnosis not present

## 2014-05-30 ENCOUNTER — Other Ambulatory Visit: Payer: Self-pay

## 2014-05-30 DIAGNOSIS — L814 Other melanin hyperpigmentation: Secondary | ICD-10-CM | POA: Diagnosis not present

## 2014-05-30 DIAGNOSIS — L570 Actinic keratosis: Secondary | ICD-10-CM | POA: Diagnosis not present

## 2014-05-30 DIAGNOSIS — L853 Xerosis cutis: Secondary | ICD-10-CM | POA: Diagnosis not present

## 2014-05-30 DIAGNOSIS — Z85828 Personal history of other malignant neoplasm of skin: Secondary | ICD-10-CM | POA: Diagnosis not present

## 2014-05-30 DIAGNOSIS — L821 Other seborrheic keratosis: Secondary | ICD-10-CM | POA: Diagnosis not present

## 2014-05-30 DIAGNOSIS — D485 Neoplasm of uncertain behavior of skin: Secondary | ICD-10-CM | POA: Diagnosis not present

## 2014-05-30 DIAGNOSIS — C4401 Basal cell carcinoma of skin of lip: Secondary | ICD-10-CM | POA: Diagnosis not present

## 2014-06-15 DIAGNOSIS — M1712 Unilateral primary osteoarthritis, left knee: Secondary | ICD-10-CM | POA: Diagnosis not present

## 2014-06-15 DIAGNOSIS — M1711 Unilateral primary osteoarthritis, right knee: Secondary | ICD-10-CM | POA: Diagnosis not present

## 2014-07-12 DIAGNOSIS — Z85828 Personal history of other malignant neoplasm of skin: Secondary | ICD-10-CM | POA: Diagnosis not present

## 2014-07-12 DIAGNOSIS — C4401 Basal cell carcinoma of skin of lip: Secondary | ICD-10-CM | POA: Diagnosis not present

## 2014-09-06 DIAGNOSIS — M1711 Unilateral primary osteoarthritis, right knee: Secondary | ICD-10-CM | POA: Diagnosis not present

## 2014-09-06 DIAGNOSIS — M1712 Unilateral primary osteoarthritis, left knee: Secondary | ICD-10-CM | POA: Diagnosis not present

## 2014-09-18 DIAGNOSIS — Z7901 Long term (current) use of anticoagulants: Secondary | ICD-10-CM | POA: Diagnosis not present

## 2014-09-18 DIAGNOSIS — K219 Gastro-esophageal reflux disease without esophagitis: Secondary | ICD-10-CM | POA: Diagnosis not present

## 2014-09-18 DIAGNOSIS — Z8546 Personal history of malignant neoplasm of prostate: Secondary | ICD-10-CM | POA: Diagnosis not present

## 2014-09-18 DIAGNOSIS — E785 Hyperlipidemia, unspecified: Secondary | ICD-10-CM | POA: Diagnosis not present

## 2014-09-18 DIAGNOSIS — I482 Chronic atrial fibrillation: Secondary | ICD-10-CM | POA: Diagnosis not present

## 2014-09-28 DIAGNOSIS — M1711 Unilateral primary osteoarthritis, right knee: Secondary | ICD-10-CM | POA: Diagnosis not present

## 2014-09-28 DIAGNOSIS — M1712 Unilateral primary osteoarthritis, left knee: Secondary | ICD-10-CM | POA: Diagnosis not present

## 2014-10-05 DIAGNOSIS — M170 Bilateral primary osteoarthritis of knee: Secondary | ICD-10-CM | POA: Diagnosis not present

## 2014-10-18 DIAGNOSIS — M170 Bilateral primary osteoarthritis of knee: Secondary | ICD-10-CM | POA: Diagnosis not present

## 2014-10-27 DIAGNOSIS — C61 Malignant neoplasm of prostate: Secondary | ICD-10-CM | POA: Diagnosis not present

## 2014-10-27 DIAGNOSIS — N402 Nodular prostate without lower urinary tract symptoms: Secondary | ICD-10-CM | POA: Diagnosis not present

## 2014-10-27 DIAGNOSIS — N320 Bladder-neck obstruction: Secondary | ICD-10-CM | POA: Diagnosis not present

## 2014-11-28 DIAGNOSIS — Z0000 Encounter for general adult medical examination without abnormal findings: Secondary | ICD-10-CM | POA: Diagnosis not present

## 2014-11-28 DIAGNOSIS — E782 Mixed hyperlipidemia: Secondary | ICD-10-CM | POA: Diagnosis not present

## 2014-12-04 DIAGNOSIS — M1712 Unilateral primary osteoarthritis, left knee: Secondary | ICD-10-CM | POA: Diagnosis not present

## 2014-12-04 DIAGNOSIS — M1711 Unilateral primary osteoarthritis, right knee: Secondary | ICD-10-CM | POA: Diagnosis not present

## 2014-12-04 DIAGNOSIS — M170 Bilateral primary osteoarthritis of knee: Secondary | ICD-10-CM | POA: Diagnosis not present

## 2014-12-05 DIAGNOSIS — I481 Persistent atrial fibrillation: Secondary | ICD-10-CM | POA: Diagnosis not present

## 2014-12-05 DIAGNOSIS — E782 Mixed hyperlipidemia: Secondary | ICD-10-CM | POA: Diagnosis not present

## 2014-12-05 DIAGNOSIS — G609 Hereditary and idiopathic neuropathy, unspecified: Secondary | ICD-10-CM | POA: Diagnosis not present

## 2014-12-05 DIAGNOSIS — M1A0790 Idiopathic chronic gout, unspecified ankle and foot, without tophus (tophi): Secondary | ICD-10-CM | POA: Diagnosis not present

## 2014-12-14 ENCOUNTER — Emergency Department (HOSPITAL_COMMUNITY): Payer: Medicare Other

## 2014-12-14 ENCOUNTER — Encounter (HOSPITAL_COMMUNITY): Payer: Self-pay | Admitting: Emergency Medicine

## 2014-12-14 ENCOUNTER — Observation Stay (HOSPITAL_COMMUNITY)
Admission: EM | Admit: 2014-12-14 | Discharge: 2014-12-15 | Disposition: A | Discharge status: Home / Self Care | Payer: Medicare Other | Attending: Internal Medicine | Admitting: Internal Medicine

## 2014-12-14 DIAGNOSIS — G629 Polyneuropathy, unspecified: Secondary | ICD-10-CM | POA: Diagnosis not present

## 2014-12-14 DIAGNOSIS — Z8546 Personal history of malignant neoplasm of prostate: Secondary | ICD-10-CM | POA: Diagnosis not present

## 2014-12-14 DIAGNOSIS — R079 Chest pain, unspecified: Secondary | ICD-10-CM | POA: Diagnosis not present

## 2014-12-14 DIAGNOSIS — R0602 Shortness of breath: Secondary | ICD-10-CM | POA: Diagnosis not present

## 2014-12-14 DIAGNOSIS — I480 Paroxysmal atrial fibrillation: Secondary | ICD-10-CM | POA: Diagnosis not present

## 2014-12-14 DIAGNOSIS — Z79899 Other long term (current) drug therapy: Secondary | ICD-10-CM | POA: Diagnosis not present

## 2014-12-14 DIAGNOSIS — E785 Hyperlipidemia, unspecified: Secondary | ICD-10-CM | POA: Diagnosis not present

## 2014-12-14 DIAGNOSIS — K219 Gastro-esophageal reflux disease without esophagitis: Secondary | ICD-10-CM | POA: Diagnosis not present

## 2014-12-14 DIAGNOSIS — K222 Esophageal obstruction: Secondary | ICD-10-CM | POA: Diagnosis not present

## 2014-12-14 DIAGNOSIS — M1990 Unspecified osteoarthritis, unspecified site: Secondary | ICD-10-CM | POA: Insufficient documentation

## 2014-12-14 DIAGNOSIS — M810 Age-related osteoporosis without current pathological fracture: Secondary | ICD-10-CM | POA: Diagnosis not present

## 2014-12-14 DIAGNOSIS — Z8719 Personal history of other diseases of the digestive system: Secondary | ICD-10-CM

## 2014-12-14 DIAGNOSIS — I482 Chronic atrial fibrillation, unspecified: Secondary | ICD-10-CM | POA: Diagnosis present

## 2014-12-14 DIAGNOSIS — E559 Vitamin D deficiency, unspecified: Secondary | ICD-10-CM | POA: Insufficient documentation

## 2014-12-14 DIAGNOSIS — E780 Pure hypercholesterolemia: Secondary | ICD-10-CM | POA: Insufficient documentation

## 2014-12-14 DIAGNOSIS — N400 Enlarged prostate without lower urinary tract symptoms: Secondary | ICD-10-CM | POA: Diagnosis not present

## 2014-12-14 HISTORY — DX: Personal history of other diseases of the digestive system: Z87.19

## 2014-12-14 HISTORY — DX: Chronic atrial fibrillation, unspecified: I48.20

## 2014-12-14 HISTORY — DX: Esophageal obstruction: K22.2

## 2014-12-14 LAB — BASIC METABOLIC PANEL
Anion gap: 8 (ref 5–15)
BUN: 20 mg/dL (ref 6–20)
CHLORIDE: 109 mmol/L (ref 101–111)
CO2: 22 mmol/L (ref 22–32)
Calcium: 8.7 mg/dL — ABNORMAL LOW (ref 8.9–10.3)
Creatinine, Ser: 0.9 mg/dL (ref 0.61–1.24)
GFR calc non Af Amer: >60 mL/min (ref >60)
Glucose, Bld: 110 mg/dL — ABNORMAL HIGH (ref 65–99)
POTASSIUM: 3.9 mmol/L (ref 3.5–5.1)
Sodium: 139 mmol/L (ref 135–145)

## 2014-12-14 LAB — CBC
HCT: 40.8 % (ref 39.0–52.0)
Hemoglobin: 13.6 g/dL (ref 13.0–17.0)
MCH: 33.1 pg (ref 26.0–34.0)
MCHC: 33.3 g/dL (ref 30.0–36.0)
MCV: 99.3 fL (ref 78.0–100.0)
Platelets: 154 10*3/uL (ref 150–400)
RBC: 4.11 MIL/uL — AB (ref 4.22–5.81)
RDW: 15.2 % (ref 11.5–15.5)
WBC: 8.5 10*3/uL (ref 4.0–10.5)

## 2014-12-14 LAB — I-STAT TROPONIN, ED: Troponin i, poc: 0.03 ng/mL (ref 0.00–0.08)

## 2014-12-14 LAB — TROPONIN I

## 2014-12-14 MED ORDER — APIXABAN 5 MG PO TABS
5.0000 mg | ORAL_TABLET | Freq: Two times a day (BID) | ORAL | Status: DC
  Administered 2014-12-14 – 2014-12-15 (×3): 5 mg via ORAL
  Filled 2014-12-14 (×4): qty 1

## 2014-12-14 MED ORDER — PANTOPRAZOLE SODIUM 40 MG PO TBEC
40.0000 mg | DELAYED_RELEASE_TABLET | Freq: Every day | ORAL | Status: DC
Start: 2014-12-14 — End: 2014-12-15
  Administered 2014-12-14 – 2014-12-15 (×2): 40 mg via ORAL
  Filled 2014-12-14 (×2): qty 1

## 2014-12-14 MED ORDER — METOPROLOL TARTRATE 25 MG PO TABS
25.0000 mg | ORAL_TABLET | Freq: Two times a day (BID) | ORAL | Status: DC
  Administered 2014-12-14 (×2): 25 mg via ORAL
  Filled 2014-12-14 (×2): qty 1

## 2014-12-14 MED ORDER — ACETAMINOPHEN 325 MG PO TABS: 650.0000 mg | ORAL_TABLET | ORAL | Status: DC | PRN

## 2014-12-14 MED ORDER — ASPIRIN EC 81 MG PO TBEC
81.0000 mg | DELAYED_RELEASE_TABLET | Freq: Every day | ORAL | Status: DC
  Administered 2014-12-15: 81 mg via ORAL
  Filled 2014-12-14: qty 1

## 2014-12-14 MED ORDER — MORPHINE SULFATE 2 MG/ML IJ SOLN
2.0000 mg | INTRAMUSCULAR | Status: DC | PRN
Start: 2014-12-14 — End: 2014-12-15

## 2014-12-14 MED ORDER — ONDANSETRON HCL 4 MG/2ML IJ SOLN: 4.0000 mg | Freq: Four times a day (QID) | INTRAMUSCULAR | Status: DC | PRN

## 2014-12-14 MED ORDER — ROSUVASTATIN CALCIUM 10 MG PO TABS
10.0000 mg | ORAL_TABLET | ORAL | Status: DC
  Administered 2014-12-14: 10 mg via ORAL
  Filled 2014-12-14: qty 1

## 2014-12-14 MED ORDER — NITROGLYCERIN 0.4 MG SL SUBL: 0.4000 mg | SUBLINGUAL_TABLET | SUBLINGUAL | Status: DC | PRN

## 2014-12-14 MED ORDER — GI COCKTAIL ~~LOC~~
30.0000 mL | Freq: Four times a day (QID) | ORAL | Status: DC | PRN
  Administered 2014-12-14 (×2): 30 mL via ORAL
  Filled 2014-12-14 (×2): qty 30

## 2014-12-14 NOTE — H&P (Signed)
[facility] History and Physical                                                                                    George [name], is a 81 y.o. male  MRN: [id number]   DOB - 05/28/1923  Admit Date - 12/14/2014  Outpatient Primary MD for the patient is Merrilee Seashore, MD  Referring MD: Rogene Houston / ER  With History of -  Past Medical History  Diagnosis Date  . Dysrhythmia     palpitations  . GERD (gastroesophageal reflux disease)   . Arthritis   . Neuropathy     feet numb all the time  . Shortness of breath     exertion  . Dysphagia   . Hypercholesteremia   . Osteoarthritis   . A-fib   . Osteoporosis   . BPH (benign prostatic hypertrophy)   . Peripheral neuropathy   . Vitamin D deficiency   . Bronchitis   . Respiratory infection   . Knee effusion     left  . Trigger finger of both hands   . DJD (degenerative joint disease)   . Arthritis   . Vertigo   . Cancer 5-6- yrs ago    prostate, watching psa levels, no tx done  . Prostate cancer       Past Surgical History  Procedure Laterality Date  . Joint replacement Bilateral yrs ago    hips  . Cholecystectomy  2010  . Fluid removed from knees      done every month or so by dr Wynelle Link  . Skin cancer removed from face  1 week ago    area healing well, some areas reved in past also  . Esophagogastroduodenoscopy (egd) with propofol N/A 08/10/2013    Procedure: ESOPHAGOGASTRODUODENOSCOPY (EGD) WITH PROPOFOL;  Surgeon: Arta Silence, MD;  Location: [facility]COPY;  Service: Endoscopy;  Laterality: N/A;  . Balloon dilation N/A 08/10/2013    Procedure: BALLOON DILATION;  Surgeon: Arta Silence, MD;  Location: [facility];  Service: Endoscopy;  Laterality: N/A;  . Botox injection N/A 08/10/2013    Procedure: BOTOX INJECTION;  Surgeon: Arta Silence, MD;  Location: [facility];  Service: Endoscopy;  Laterality: N/A;  + or -      balloon/botox  . Cardiac catheterization    . Inguinal hernia repair      in for    Chief Complaint  Patient presents with  . Chest Pain     HPI This is a 81 year old male patient with history of chronic atrial fibrillation on eliquis, known esophageal ring status post dilatation 2015, peripheral neuropathy with gait disturbance, remote history of GI bleeding, left hip arthroplasty who presented with bilateral anterior chest pain that awakened him from sleep. Patient reports that at 3 AM he was awakened with bilateral upper chest discomfort that was somewhat similar to indigestion and gas but not typical of his previous GI symptoms that were related to his known esophageal ring. He reports the chest pain did not radiate and was associated with shortness of breath but no other symptoms. The pain lasted for more than 15-20 minutes so he contacted EMS. The pain seemed worse when patient  was supine and better after sitting upright. Pain resolved after he was given aspirin and one sublingual nitroglycerin. He does report that since his esophageal dilatation in April 2015 that he has experienced some sensation of food sticking in his throat again but not as severe as prior to the dilatation. He reports that his activity is limited by his underlying neuropathy and gait disturbance and has not noticed any dyspnea on exertion or exertional chest pain. Patient had an abnormal stress test greater than 10 years ago prompting a cardiac catheterization which was negative for obstructive coronary disease. Patient underwent cholecystectomy in 2011 for gangrenous cholecystitis.  In the ER patient was afebrile and hemodynamically stable with telemetry revealing atrial fibrillation, room air saturations 97%. EKG without any acute ischemic changes. Troponin was negative times one collection. Laboratory data unremarkable except for mildly elevated glucose of 110 and slightly decreased calcium of 8.7. Chest x-ray unremarkable   Review of Systems   In addition to the HPI above,  No Fever-chills, myalgias  or other constitutional symptoms No Headache, changes with Vision or hearing, new weakness, tingling, numbness in any extremity, No problems swallowing food or Liquids, indigestion/reflux No Cough or Shortness of Breath, palpitations, orthopnea or DOE No Abdominal pain, N/V; no melena or hematochezia, no dark tarry stools, Bowel movements are regular, No dysuria, hematuria or flank pain No new skin rashes, lesions, masses or bruises, No new joints pains-aches No recent weight gain or loss No polyuria, polydypsia or polyphagia,  *A full 10 point Review of Systems was done, except as stated above, all other Review of Systems were negative.  Social History Social History  Substance Use Topics  . Smoking status: Never Smoker   . Smokeless tobacco: Never Used  . Alcohol Use: No    Resides at: Private residence  Lives with: Alone and still drives  Ambulatory status: With a cane   Family History Family History  Problem Relation Age of Onset  . Cancer - Other Father      Prior to Admission medications   Medication Sig Start Date End Date Taking? Authorizing Provider  apixaban (ELIQUIS) 5 MG TABS tablet Take 1 tablet (5 mg total) by mouth 2 (two) times daily. 08/12/13  Yes Arta Silence, MD  rosuvastatin (CRESTOR) 10 MG tablet Take 10 mg by mouth every other day.    Yes Historical Provider, MD    Allergies  Allergen Reactions  . Aspirin     bleeding    Physical Exam  Vitals  Blood pressure 131/80, pulse 66, temperature 97.8 F (36.6 C), temperature source Oral, resp. rate 17, SpO2 97 %.   General:  In no acute distress, appears healthy and well nourished  Psych:  Normal affect, Denies Suicidal or Homicidal ideations, Awake Alert, Oriented X 3. Speech and thought patterns are clear and appropriate, no apparent short term memory deficits  Neuro:   No focal neurological deficits, CN II through XII intact, Strength 5/5 all 4 extremities, Sensation intact all 4  extremities.  ENT:  Ears and Eyes appear Normal, Conjunctivae clear, PER. Moist oral mucosa without erythema or exudates.  Neck:  Supple, No lymphadenopathy appreciated  Respiratory:  Symmetrical chest wall movement, Good air movement bilaterally, CTAB. Room Air  Cardiac:  RRR, No Murmurs, no LE edema noted, no JVD, No carotid bruits, peripheral pulses palpable at 2+  Abdomen:  Positive bowel sounds, Soft, Non tender, Non distended,  No masses appreciated, no obvious hepatosplenomegaly  Skin:  No Cyanosis, Normal Skin Turgor,  No Skin Rash or Bruise.  Extremities: Symmetrical without obvious trauma or injury,  no effusions.  Data Review  CBC  Recent Labs Lab 12/14/14 0922  WBC 8.5  HGB 13.6  HCT 40.8  PLT 154  MCV 99.3  MCH 33.1  MCHC 33.3  RDW 15.2    Chemistries   Recent Labs Lab 12/14/14 0922  NA 139  K 3.9  CL 109  CO2 22  GLUCOSE 110*  BUN 20  CREATININE 0.90  CALCIUM 8.7*    CrCl cannot be calculated (Unknown ideal weight.).  No results for input(s): TSH, T4TOTAL, T3FREE, THYROIDAB in the last 72 hours.  Invalid input(s): FREET3  Coagulation profile No results for input(s): INR, PROTIME in the last 168 hours.  No results for input(s): DDIMER in the last 72 hours.  Cardiac Enzymes No results for input(s): CKMB, TROPONINI, MYOGLOBIN in the last 168 hours.  Invalid input(s): CK  Invalid input(s): POCBNP  Urinalysis No results found for: COLORURINE, APPEARANCEUR, LABSPEC, PHURINE, GLUCOSEU, HGBUR, BILIRUBINUR, KETONESUR, PROTEINUR, UROBILINOGEN, NITRITE, LEUKOCYTESUR  Imaging results:   Dg Chest 2 View  12/14/2014   CLINICAL DATA:  Chest pain and shortness of breath.  EXAM: CHEST  2 VIEW  COMPARISON:  12/09/2009 and 10/23/2006  FINDINGS: The heart size and mediastinal contours are within normal limits. Both lungs are clear. The visualized skeletal structures are unremarkable.  IMPRESSION: No active cardiopulmonary disease.   Electronically  Signed   By: Lorriane Shire M.D.   On: 12/14/2014 09:36     EKG: (Independently reviewed) atrial fibrillation with left axis deviation, rate controlled 73 bpm, QTC 444 ms, no acute ischemic changes   Assessment & Plan  Principal Problem:   Chest pain -Admit to telemetry -Heart score of 5-chest pain not typical since was worse when supine appears somewhat related to GI -Primary cardiologist Dr. Wynonia Lawman -Cycle troponin -Echocardiogram -If above workup negative consider having patient follow up with Dr. Wynonia Lawman to determine if outpatient stress test is indicated -If enzymes positive for patient continues with episodic chest pain will need formal cardiology consultation for possible cardiac catheterization versus stress test -When necessary sublingual nitroglycerin -Currently chest pain resolved -Low-dose aspirin (see below) -Begin beta blocker -monitor for bradycardia  Active Problems:   Esophageal ring -Not on chronic proton pump inhibitor or H2 blockers -Above pain could be related to recurrent esophageal issues -If cardiac workup negative needs referral back to Dr. Paulita Fujita    Chronic atrial fibrillation -Currently rate controlled -Continue eliquis    HLD  -Continue Crestor    History of GI bleed -Denies symptoms of upper or lower GI bleeding -Reports GI bleed precipitated by aspirin in the past therefore we'll utilize aspirin 81 mg with daily Protonix    Peripheral neuropathy w/gait disturbance -Does not require any medications for chronic pain syndrome related to neuropathy -Does report difficulty walking secondary to hip pain and the neuropathy and utilizes a cane at baseline    DVT Prophylaxis: Eliquis  Family Communication:   No family at bedside  Code Status:  Full code  Condition:  Stable  Discharge disposition: Anticipate discharge to home within 24 hours if ischemic workup negative  Time spent in minutes : 60      ELLIS,ALLISON L. ANP on 12/14/2014 at  11:14 AM  Between 7am to 7pm - Pager - [phone]  After 7pm go to [email or web address] - password [email or web address]  And look for the night coverage person covering me after hours  [facility]

## 2014-12-14 NOTE — ED Notes (Signed)
Admitting NP in to assess 

## 2014-12-14 NOTE — Consult Note (Signed)
Cardiology Consult Note  Admit date: 12/14/2014 Name: George [name] 81 y.o.  male DOB:  1923/06/18 MRN:  [id number]  Today's date:  12/14/2014  Referring Physician:    [facility]  Primary Physician:    Dr. Ashby Dawes  Reason for Consultation:    Chest pain  IMPRESSIONS: 1.  Chest pain occurring at rest which is atypical for myocardial ischemia. 2.  Chronic atrial fibrillation currently rate controlled 3.  Long-term use of anticoagulates without complication 4.  History of esophageal stricture with dilation 5.  BPH and prostate cancer 6.  Severe peripheral neuropathy with gait disturbance  RECOMMENDATION: At this point the pain was atypical for myocardial ischemia.  I would cycle cardiac enzymes and if they're negative I think he can go home if he has no further pain.  He is almost 81 years old and unless he has recurrent chest pain at all really see a role for stress testing here.  His EKG is unremarkable.  He may benefit from a GI evaluation as this could have been esophageal spasm.  HISTORY: This 81 year old male is seen for evaluation of chest pain.  The patient is a widower and currently lives in alone at home.  He gets along fairly well but is limited with severe arthritis as well as low back pain and peripheral neuropathy.  He has a gait disturbance with an unsteady gait.  He has chronic atrial fibrillation which is rate controlled and he is asymptomatic without prior history of angina.  He was awakened with midsternal pain that he describes as congestion that was higher than what he normally has.  He got up and walked around and noticed some resolution of the symptoms.  He had no shortness of breath or sweating with the discomfort.  He leaned back in a chair after he had walked around and noted some recurrence of the pain after lying back down and called EMS.  He was transported here with an unremarkable EKG.  His initial troponin was 0.03 but the remainder have been  normal.  He denies PND, orthopnea, syncope, or claudication.  Past Medical History  Diagnosis Date  . GERD (gastroesophageal reflux disease)   . Peripheral neuropathy   . Hypercholesteremia   . Osteoarthritis   . Osteoporosis   . BPH (benign prostatic hypertrophy)   . Peripheral neuropathy   . Vitamin D deficiency   . Trigger finger of both hands   . Vertigo   . Prostate cancer     treated with observation  . Esophageal ring   . History of GI bleed   . Chronic atrial fibrillation     [id number] score 3       Past Surgical History  Procedure Laterality Date  . Total hip arthroplasty Bilateral yrs ago    hips  . Cholecystectomy  2010  . Skin cancer removed from face  1 week ago    area healing well, some areas reved in past also  . Esophagogastroduodenoscopy (egd) with propofol N/A 08/10/2013    Procedure: ESOPHAGOGASTRODUODENOSCOPY (EGD) WITH PROPOFOL;  Surgeon: Arta Silence, MD;  Location: [facility];  Service: Endoscopy;  Laterality: N/A;  . Balloon dilation N/A 08/10/2013    Procedure: BALLOON DILATION;  Surgeon: Arta Silence, MD;  Location: [facility];  Service: Endoscopy;  Laterality: N/A;  . Botox injection N/A 08/10/2013    Procedure: BOTOX INJECTION;  Surgeon: Arta Silence, MD;  Location: [facility];  Service: Endoscopy;  Laterality: N/A;  + or -  balloon/botox  . Cardiac catheterization    . Inguinal hernia repair    . Knee arthrocentesis      Lumbar laminectomy   Elbow surgery  Allergies:  is allergic to aspirin.   Medications: Prior to Admission medications   Medication Sig Start Date End Date Taking? Authorizing Provider  apixaban (ELIQUIS) 5 MG TABS tablet Take 1 tablet (5 mg total) by mouth 2 (two) times daily. 08/12/13  Yes Arta Silence, MD  rosuvastatin (CRESTOR) 10 MG tablet Take 10 mg by mouth every other day.    Yes Historical Provider, MD   Family History: Family Status  Relation Status Death Age  . Mother Deceased 41    Cancer  .  Father Deceased 38    Dementia  . Brother Alive   . Brother Alive   . Sister Deceased     cancer  . Sister Alive    Social History:   reports that he has never smoked. He has never used smokeless tobacco. He reports that he does not drink alcohol or use illicit drugs.   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  Review of Systems: He has had prior cataract surgery, normally no shortness of breath or chest pain.  He has mild  BPH-type symptoms.  Has prostate cancer.  Prior history of lumbar disc disease.  Severe peripheral neuropathy with gait disturbance in past.  He also has significant arthritis of his knees and requires repeated aspirations of fluid and injections.  Other than as noted above the remainder of the review of systems is unremarkable.  Physical Exam: BP 131/80 mmHg  Pulse 66  Temp(Src) 97.8 F (36.6 C) (Oral)  Resp 17  SpO2 97%  General appearance: Pleasant elderly male in no acute distress.  Younger than stated age Head: Normocephalic, without obvious abnormality, atraumatic, Balding male hair pattern Eyes: conjunctivae/corneas clear. PERRL, EOM's intact. Fundi: Not examined  Neck: no adenopathy, no carotid bruit, no JVD and supple, symmetrical, trachea midline Lungs: clear to auscultation bilaterally Heart: Irregular rate and rhythm, normal S1 and S2, no S3 or S4, no murmur Abdomen: soft, non-tender; bowel sounds normal; no masses,  no organomegaly Rectal: deferred Extremities: extremities normal, atraumatic, no cyanosis or edema Pulses: 2+ and symmetric Skin: Skin color, texture, turgor normal. No rashes or lesions Lymph nodes: Cervical, supraclavicular, and axillary nodes normal. Neurologic: Grossly normal  Labs: CBC  Recent Labs  12/14/14 0922  WBC 8.5  RBC 4.11*  HGB 13.6  HCT 40.8  PLT 154  MCV 99.3  MCH 33.1  MCHC 33.3  RDW 15.2   CMP   Recent Labs  12/14/14 0922  NA 139  K 3.9  CL 109  CO2 22  GLUCOSE  110*  BUN 20  CREATININE 0.90  CALCIUM 8.7*  GFRNONAA >60  GFRAA >60   Troponin (Point of Care Test)  Recent Labs  12/14/14 0943  TROPIPOC 0.03     Radiology: No active cardiopulmonary disease  EKG: Atrial fibrillation with controlled response, left axis deviation, RSR prime in V1  Signed:  W. Doristine Church MD [facility]   Cardiology Consultant  12/14/2014, 12:37 PM

## 2014-12-14 NOTE — ED Notes (Addendum)
Patient presents to ED via [facility] with c/o chest pain that started at 2am.  Patient states had a hard pain and when pain went away, he felt the pressure.  States he got up and walked around and pain/pressure went away.  Patient states pain would come back when he laid down.  Patient denies any pain/pressure at this time.  EMS gave ASA 324mg  po and NTG SL x1 en route.

## 2014-12-14 NOTE — ED Provider Notes (Signed)
CSN: [id number]     Arrival date & time 12/14/14  0848 History   First MD Initiated Contact with Patient 12/14/14 [phone]     Chief Complaint  Patient presents with  . Chest Pain     (Consider location/radiation/quality/duration/timing/severity/associated sxs/prior Treatment) Patient is a 81 y.o. male presenting with chest pain. The history is provided by the patient.  Chest Pain Associated symptoms: shortness of breath   Associated symptoms: no abdominal pain, no back pain, no fever, no headache, no nausea and not vomiting    patient states that he had onset of bilateral upper chest pain at 3 in the morning. It would be worse when he would lay down and better when he would stand up but wasn't resolved until 8:00 in the morning when EMS gave him aspirin and 1 sublingual nitroglycerin. Associated with some mild shortness of breath but no severe shortness of breath. Patient is now pain-free. Patient has long-standing history of atrial fibrillation does not have any history of known coronary artery disease. Had cardiac cath 10 years ago. Followed by Dr. Wynonia Lawman from cardiology. Primary care doctors is [employer]. Patient is on blood thinners for the atrial fibrillation. The pain when present was 8 out of 10 at its worse. Pain was nonradiating.  Past Medical History  Diagnosis Date  . Dysrhythmia     palpitations  . GERD (gastroesophageal reflux disease)   . Arthritis   . Neuropathy     feet numb all the time  . Shortness of breath     exertion  . Dysphagia   . Hypercholesteremia   . Osteoarthritis   . A-fib   . Osteoporosis   . BPH (benign prostatic hypertrophy)   . Peripheral neuropathy   . Vitamin D deficiency   . Bronchitis   . Respiratory infection   . Knee effusion     left  . Trigger finger of both hands   . DJD (degenerative joint disease)   . Arthritis   . Vertigo   . Cancer 5-6- yrs ago    prostate, watching psa levels, no tx done  . Prostate cancer     Past Surgical History  Procedure Laterality Date  . Joint replacement Bilateral yrs ago    hips  . Cholecystectomy  2010  . Fluid removed from knees      done every month or so by dr Wynelle Link  . Skin cancer removed from face  1 week ago    area healing well, some areas reved in past also  . Esophagogastroduodenoscopy (egd) with propofol N/A 08/10/2013    Procedure: ESOPHAGOGASTRODUODENOSCOPY (EGD) WITH PROPOFOL;  Surgeon: Arta Silence, MD;  Location: [facility];  Service: Endoscopy;  Laterality: N/A;  . Balloon dilation N/A 08/10/2013    Procedure: BALLOON DILATION;  Surgeon: Arta Silence, MD;  Location: [facility];  Service: Endoscopy;  Laterality: N/A;  . Botox injection N/A 08/10/2013    Procedure: BOTOX INJECTION;  Surgeon: Arta Silence, MD;  Location: [facility];  Service: Endoscopy;  Laterality: N/A;  + or -      balloon/botox  . Cardiac catheterization    . Inguinal hernia repair     Family History  Problem Relation Age of Onset  . Cancer - Other Father    Social History  Substance Use Topics  . Smoking status: Never Smoker   . Smokeless tobacco: Never Used  . Alcohol Use: No    Review of Systems  Constitutional: Negative for fever.  HENT: Negative  for congestion.   Eyes: Negative for visual disturbance.  Respiratory: Positive for shortness of breath.   Cardiovascular: Positive for chest pain. Negative for leg swelling.  Gastrointestinal: Negative for nausea, vomiting and abdominal pain.  Genitourinary: Negative for dysuria.  Musculoskeletal: Negative for back pain.  Skin: Negative for rash.  Neurological: Negative for headaches.  Hematological: Does not bruise/bleed easily.  Psychiatric/Behavioral: Negative for confusion.      Allergies  Aspirin  Home Medications   Prior to Admission medications   Medication Sig Start Date End Date Taking? Authorizing Provider  apixaban (ELIQUIS) 5 MG TABS tablet Take 1 tablet (5 mg total) by mouth 2 (two) times  daily. 08/12/13  Yes Arta Silence, MD  rosuvastatin (CRESTOR) 10 MG tablet Take 10 mg by mouth every other day.    Yes Historical Provider, MD   BP 128/87 mmHg  Pulse 72  Temp(Src) 97.8 F (36.6 C) (Oral)  Resp 19  SpO2 97% Physical Exam  Constitutional: He is oriented to person, place, and time. He appears well-developed and well-nourished. No distress.  HENT:  Head: Normocephalic and atraumatic.  Mouth/Throat: Oropharynx is clear and moist.  Eyes: Conjunctivae and EOM are normal. Pupils are equal, round, and reactive to light.  Neck: Normal range of motion. Neck supple.  Cardiovascular: Normal rate, regular rhythm and normal heart sounds.   No murmur heard. Pulmonary/Chest: Effort normal and breath sounds normal. No respiratory distress.  Abdominal: Soft. Bowel sounds are normal. There is no tenderness.  Musculoskeletal: Normal range of motion. He exhibits no edema.  Neurological: He is alert and oriented to person, place, and time. No cranial nerve deficit. He exhibits normal muscle tone. Coordination normal.  Skin: Skin is warm. No rash noted.  Nursing note and vitals reviewed.   ED Course  Procedures (including critical care time) Labs Review Labs Reviewed  CBC - Abnormal; Notable for the following:    RBC 4.11 (*)    All other components within normal limits  BASIC METABOLIC PANEL - Abnormal; Notable for the following:    Glucose, Bld 110 (*)    Calcium 8.7 (*)    All other components within normal limits  I-STAT TROPOININ, ED   Results for orders placed or performed during the hospital encounter of 12/14/14  CBC  Result Value Ref Range   WBC 8.5 4.0 - 10.5 K/uL   RBC 4.11 (L) 4.22 - 5.81 MIL/uL   Hemoglobin 13.6 13.0 - 17.0 g/dL   HCT 40.8 39.0 - 52.0 %   MCV 99.3 78.0 - 100.0 fL   MCH 33.1 26.0 - 34.0 pg   MCHC 33.3 30.0 - 36.0 g/dL   RDW 15.2 11.5 - 15.5 %   Platelets 154 150 - 400 K/uL  Basic metabolic panel  Result Value Ref Range   Sodium 139 135 -  145 mmol/L   Potassium 3.9 3.5 - 5.1 mmol/L   Chloride 109 101 - 111 mmol/L   CO2 22 22 - 32 mmol/L   Glucose, Bld 110 (H) 65 - 99 mg/dL   BUN 20 6 - 20 mg/dL   Creatinine, Ser 0.90 0.61 - 1.24 mg/dL   Calcium 8.7 (L) 8.9 - 10.3 mg/dL   GFR calc non Af Amer >60 >60 mL/min   GFR calc Af Amer >60 >60 mL/min   Anion gap 8 5 - 15  I-stat troponin, ED (not at [facility])  Result Value Ref Range   Troponin i, poc 0.03 0.00 - 0.08 ng/mL  Comment 3             Imaging Review Dg Chest 2 View  12/14/2014   CLINICAL DATA:  Chest pain and shortness of breath.  EXAM: CHEST  2 VIEW  COMPARISON:  12/09/2009 and 10/23/2006  FINDINGS: The heart size and mediastinal contours are within normal limits. Both lungs are clear. The visualized skeletal structures are unremarkable.  IMPRESSION: No active cardiopulmonary disease.   Electronically Signed   By: Lorriane Shire M.D.   On: 12/14/2014 09:36     EKG Interpretation   Date/Time:  Thursday December 14 2014 08:49:54 EDT Ventricular Rate:  73 PR Interval:    QRS Duration: 107 QT Interval:  403 QTC Calculation: 444 R Axis:   -59 Text Interpretation:  Atrial fibrillation Left axis deviation RSR' in V1  or V2, right VCD or RVH No significant change since last tracing Confirmed  by Blanche Scovell  MD, Pietra Zuluaga [phone]) on 12/14/2014 8:57:05 AM      MDM   Final diagnoses:  Chest pain, unspecified chest pain type    Patient with onset of chest pain bilateral chest at 3 in the morning. Resolved about 8 in the morning once EMS was there an aspirin and sublingual nitroglycerin. Chest pains now resolved. Patient with a history of atrial fibrillation but no cardiac history. Had a cardiac cath which was approximately 10 years ago. Cardiologist is Dr. Wynonia Lawman. Primary care doctors [facility].  Patient's initial workup is negative EKG shows rate-controlled atrial fibrillation, first troponin negative, chest x-ray negative, labs without significant  abnormalities.  Patient will require admission for rule out.    Fredia Sorrow, MD 12/14/14 1047

## 2014-12-15 ENCOUNTER — Observation Stay (HOSPITAL_BASED_OUTPATIENT_CLINIC_OR_DEPARTMENT_OTHER): Payer: Medicare Other

## 2014-12-15 DIAGNOSIS — I482 Chronic atrial fibrillation: Secondary | ICD-10-CM | POA: Diagnosis not present

## 2014-12-15 DIAGNOSIS — R079 Chest pain, unspecified: Secondary | ICD-10-CM

## 2014-12-15 DIAGNOSIS — K222 Esophageal obstruction: Secondary | ICD-10-CM

## 2014-12-15 DIAGNOSIS — K219 Gastro-esophageal reflux disease without esophagitis: Secondary | ICD-10-CM

## 2014-12-15 DIAGNOSIS — R0789 Other chest pain: Secondary | ICD-10-CM | POA: Diagnosis not present

## 2014-12-15 DIAGNOSIS — Z8719 Personal history of other diseases of the digestive system: Secondary | ICD-10-CM

## 2014-12-15 LAB — TROPONIN I
Troponin I: <0.03 ng/mL (ref <0.031)
Troponin I: <0.03 ng/mL (ref <0.031)

## 2014-12-15 MED ORDER — NITROGLYCERIN 0.4 MG SL SUBL: 0.4000 mg | SUBLINGUAL_TABLET | SUBLINGUAL | Status: AC | PRN

## 2014-12-15 MED ORDER — PANTOPRAZOLE SODIUM 40 MG PO TBEC: 40.0000 mg | DELAYED_RELEASE_TABLET | Freq: Every day | ORAL | Status: AC

## 2014-12-15 NOTE — Discharge Summary (Signed)
Physician Discharge Summary   Patient ID: [name] MRN: [id number] DOB/AGE: 1923/09/01 81 y.o.  Admit date: 12/14/2014 Discharge date: 12/15/2014  Primary Care Physician:  Merrilee Seashore, MD  Discharge Diagnoses:    . Chronic atrial fibrillation . atypical Chest pain . HLD (hyperlipidemia)  Consults:  Cardiology, Dr. Wynonia Lawman   Recommendations for Outpatient Follow-up:  Per cardiology, likely atypical chest pain and recommended outpatient GI workup. Patient has previously followed with Dr. Paulita Fujita in 2015. Patient reports that he will need new referral for GI, recommended to follow-up with PCP.  TESTS THAT NEED FOLLOW-UP BMET, CBC   DIET heart healthy diet    Allergies:   Allergies  Allergen Reactions  . Aspirin     bleeding     Discharge Medications:   Medication List    TAKE these medications        apixaban 5 MG Tabs tablet  Commonly known as:  ELIQUIS  Take 1 tablet (5 mg total) by mouth 2 (two) times daily.     nitroGLYCERIN 0.4 MG SL tablet  Commonly known as:  NITROSTAT  Place 1 tablet (0.4 mg total) under the tongue every 5 (five) minutes as needed for chest pain.     pantoprazole 40 MG tablet  Commonly known as:  PROTONIX  Take 1 tablet (40 mg total) by mouth daily.     rosuvastatin 10 MG tablet  Commonly known as:  CRESTOR  Take 10 mg by mouth every other day.         Brief H and P: For complete details please refer to admission H and P, but in brief 81 year old male with chronic atrial fibrillation on her liquids, known esophageal ring status post dilatation in 2015, peripheral neuropathy, remote history of GI bleeding follows with Dr. Tilley/cardiology. Gives history of heartburns related to reflux and takes over-the-counter antacids up to 1-2 times per week. Woke up on the early morning of admission approximately 2:30 AM and noticed midsternal burning/pressure type of chest discomfort rated at 7/10 in intensity, nonradiating, got  better after ambulating but returned after lying back in bed, was unable to burp, not associated with dyspnea or diaphoresis. This was worse than his usual reflux pain. He called EMS and received a dose of aspirin and sublingual nitroglycerin with prompt relief and resolution of chest pain which has not recurred since. The chest pain lasted approximately 4 hours  Hospital Course:  Atypical chest pain with history of recent heartburn and gastritis Patient was not on chronic proton pump inhibitors or H2 blockers. His atypical chest pain could be related to recurrent esophageal issues. Patient was admitted and ruled out for acute ACS. Patient was seen by cardiology, Dr. Wynonia Lawman and was cleared for discharge home. Patient was recommended to follow outpatient with gastroenterology. He was placed on Protonix daily.  Chronic atrial fibrillation Currently rate controlled, continue eliquis  Hyperlipidemia Continue statin  History of GI bleed Currently stable, and denied any upper or lower GI bleeding, patient is on apixaban  Peripheral neuropathy Currently stable   Day of Discharge BP 104/60 mmHg  Pulse 45  Temp(Src) 98.7 F (37.1 C) (Oral)  Resp 16  Ht 5\' 9"  (1.753 m)  Wt 76.34 kg (168 lb 4.8 oz)  BMI 24.84 kg/m2  SpO2 95%  Physical Exam: General: Alert and awake oriented x3 not in any acute distress. HEENT: anicteric sclera, pupils reactive to light and accommodation CVS: S1-S2 clear no murmur rubs or gallops Chest: clear to auscultation bilaterally, no wheezing  rales or rhonchi Abdomen: soft nontender, nondistended, normal bowel sounds Extremities: no cyanosis, clubbing or edema noted bilaterally Neuro: Cranial nerves II-XII intact, no focal neurological deficits   The results of significant diagnostics from this hospitalization (including imaging, microbiology, ancillary and laboratory) are listed below for reference.    LAB RESULTS: Basic Metabolic Panel:  Recent Labs Lab  12/14/14 0922  NA 139  K 3.9  CL 109  CO2 22  GLUCOSE 110*  BUN 20  CREATININE 0.90  CALCIUM 8.7*   Liver Function Tests: No results for input(s): AST, ALT, ALKPHOS, BILITOT, PROT, ALBUMIN in the last 168 hours. No results for input(s): LIPASE, AMYLASE in the last 168 hours. No results for input(s): AMMONIA in the last 168 hours. CBC:  Recent Labs Lab 12/14/14 0922  WBC 8.5  HGB 13.6  HCT 40.8  MCV 99.3  PLT 154   Cardiac Enzymes:  Recent Labs Lab 12/15/14 0017 12/15/14 0346  TROPONINI <0.03 <0.03   BNP: Invalid input(s): POCBNP CBG: No results for input(s): GLUCAP in the last 168 hours.  Significant Diagnostic Studies:  Dg Chest 2 View  12/14/2014   CLINICAL DATA:  Chest pain and shortness of breath.  EXAM: CHEST  2 VIEW  COMPARISON:  12/09/2009 and 10/23/2006  FINDINGS: The heart size and mediastinal contours are within normal limits. Both lungs are clear. The visualized skeletal structures are unremarkable.  IMPRESSION: No active cardiopulmonary disease.   Electronically Signed   By: Lorriane Shire M.D.   On: 12/14/2014 09:36    2D ECHO:   Disposition and Follow-up:     Discharge Instructions    Diet - low sodium heart healthy    Complete by:  As directed      Increase activity slowly    Complete by:  As directed             DISPOSITION: Home   DISCHARGE FOLLOW-UP Follow-up Information    Follow up with Ezzard Standing, MD. Schedule an appointment as soon as possible for a visit in 2 weeks.   Specialty:  Cardiology   Why:  for hospital follow-up   Contact information:   [place] [address] [phone]       Follow up with [facility], MD. Go on 12/26/2014.   Specialty:  Internal Medicine   Why:  for hospital follow-up @11 :45am, will see Santiago Glad, [place]    Contact information:   [place] [address] [phone]        Time spent on Discharge: 25 minutes  Signed:   Hadasa Gasner M.D. [facility] 12/15/2014, 1:54 PM Pager: [phone]

## 2014-12-15 NOTE — Progress Notes (Signed)
  Echocardiogram 2D Echocardiogram has been performed.  Darlina Sicilian M 12/15/2014, 11:36 AM

## 2014-12-15 NOTE — Progress Notes (Signed)
Subjective:  No recurrent chest pain overnight.  Enzymes are negative.  Objective:  Vital Signs in the last 24 hours: BP 103/64 mmHg  Pulse 70  Temp(Src) 97.9 F (36.6 C) (Oral)  Resp 18  Ht 5\' 9"  (1.753 m)  Wt 76.34 kg (168 lb 4.8 oz)  BMI 24.84 kg/m2  SpO2 96%  Physical Exam: Pleasant male in no acute distress  Lungs:  Clear Cardiac: Irregular with no recurrent chest pain overnight.  All enzymes are negative.  rhythm, normal S1 and S2, no S3 Abdomen:  Soft, nontender, no masses Extremities:  No edema present  Intake/Output from previous day: 08/11 0701 - 08/12 0700 In: -  Out: 300 [Urine:300]  Weight Filed Weights   12/14/14 1705 12/15/14 0529  Weight: 76.522 kg (168 lb 11.2 oz) 76.34 kg (168 lb 4.8 oz)   Lab Results: Basic Metabolic Panel:  Recent Labs  12/14/14 0922  NA 139  K 3.9  CL 109  CO2 22  GLUCOSE 110*  BUN 20  CREATININE 0.90   CBC:  Recent Labs  12/14/14 0922  WBC 8.5  HGB 13.6  HCT 40.8  MCV 99.3  PLT 154   Cardiac Enzymes: Troponin (Point of Care Test)  Recent Labs  12/14/14 0943  TROPIPOC 0.03   Cardiac Panel (last 3 results)  Recent Labs  12/14/14 1553 12/15/14 0017 12/15/14 0346  TROPONINI <0.03 <0.03 <0.03   Telemetry: Atrial fibrillation with controlled response  Assessment/Plan:  1.  Atypical chest pain that does not sound cardiac in origin with negative troponins and an EKG that is unchanged 2.  Chronic atrial fibrillation currently rate controlled 3.  Long-term use of anticoagulates  Recommendations:  No further cardiac workup is necessary at his age of 85.  I think he can go home and I would be happy to see him in the office in a couple of weeks.  May be useful getting GI to see him since he has a history of esophageal stricture.  This certainly could be done as an outpatient.     Kerry Hough  MD [facility] Card[facility]  12/15/2014, 8:22 AM

## 2014-12-20 DIAGNOSIS — R1032 Left lower quadrant pain: Secondary | ICD-10-CM | POA: Diagnosis not present

## 2015-01-17 DIAGNOSIS — I481 Persistent atrial fibrillation: Secondary | ICD-10-CM | POA: Diagnosis not present

## 2015-01-17 DIAGNOSIS — G609 Hereditary and idiopathic neuropathy, unspecified: Secondary | ICD-10-CM | POA: Diagnosis not present

## 2015-01-17 DIAGNOSIS — K5901 Slow transit constipation: Secondary | ICD-10-CM | POA: Diagnosis not present

## 2015-01-17 DIAGNOSIS — E782 Mixed hyperlipidemia: Secondary | ICD-10-CM | POA: Diagnosis not present

## 2015-01-17 DIAGNOSIS — Z23 Encounter for immunization: Secondary | ICD-10-CM | POA: Diagnosis not present

## 2015-02-16 DIAGNOSIS — M1711 Unilateral primary osteoarthritis, right knee: Secondary | ICD-10-CM | POA: Diagnosis not present

## 2015-02-16 DIAGNOSIS — M170 Bilateral primary osteoarthritis of knee: Secondary | ICD-10-CM | POA: Diagnosis not present

## 2015-02-16 DIAGNOSIS — M1712 Unilateral primary osteoarthritis, left knee: Secondary | ICD-10-CM | POA: Diagnosis not present

## 2015-04-04 DIAGNOSIS — M170 Bilateral primary osteoarthritis of knee: Secondary | ICD-10-CM | POA: Diagnosis not present

## 2015-04-11 DIAGNOSIS — M170 Bilateral primary osteoarthritis of knee: Secondary | ICD-10-CM | POA: Diagnosis not present

## 2015-04-18 DIAGNOSIS — M170 Bilateral primary osteoarthritis of knee: Secondary | ICD-10-CM | POA: Diagnosis not present

## 2015-05-16 DIAGNOSIS — M25462 Effusion, left knee: Secondary | ICD-10-CM | POA: Diagnosis not present

## 2015-05-16 DIAGNOSIS — M170 Bilateral primary osteoarthritis of knee: Secondary | ICD-10-CM | POA: Diagnosis not present

## 2015-05-16 DIAGNOSIS — M25461 Effusion, right knee: Secondary | ICD-10-CM | POA: Diagnosis not present

## 2015-05-28 DIAGNOSIS — H35372 Puckering of macula, left eye: Secondary | ICD-10-CM | POA: Diagnosis not present

## 2015-05-28 DIAGNOSIS — H01021 Squamous blepharitis right upper eyelid: Secondary | ICD-10-CM | POA: Diagnosis not present

## 2015-05-28 DIAGNOSIS — H01024 Squamous blepharitis left upper eyelid: Secondary | ICD-10-CM | POA: Diagnosis not present

## 2015-05-28 DIAGNOSIS — H40053 Ocular hypertension, bilateral: Secondary | ICD-10-CM | POA: Diagnosis not present

## 2015-05-28 DIAGNOSIS — H01025 Squamous blepharitis left lower eyelid: Secondary | ICD-10-CM | POA: Diagnosis not present

## 2015-05-28 DIAGNOSIS — Z961 Presence of intraocular lens: Secondary | ICD-10-CM | POA: Diagnosis not present

## 2015-05-28 DIAGNOSIS — H01022 Squamous blepharitis right lower eyelid: Secondary | ICD-10-CM | POA: Diagnosis not present

## 2015-06-01 DIAGNOSIS — L821 Other seborrheic keratosis: Secondary | ICD-10-CM | POA: Diagnosis not present

## 2015-06-01 DIAGNOSIS — L814 Other melanin hyperpigmentation: Secondary | ICD-10-CM | POA: Diagnosis not present

## 2015-06-01 DIAGNOSIS — L853 Xerosis cutis: Secondary | ICD-10-CM | POA: Diagnosis not present

## 2015-06-01 DIAGNOSIS — Z85828 Personal history of other malignant neoplasm of skin: Secondary | ICD-10-CM | POA: Diagnosis not present

## 2015-06-01 DIAGNOSIS — L570 Actinic keratosis: Secondary | ICD-10-CM | POA: Diagnosis not present

## 2015-06-04 DIAGNOSIS — M25561 Pain in right knee: Secondary | ICD-10-CM | POA: Diagnosis not present

## 2015-06-04 DIAGNOSIS — M25562 Pain in left knee: Secondary | ICD-10-CM | POA: Diagnosis not present

## 2015-06-04 DIAGNOSIS — M170 Bilateral primary osteoarthritis of knee: Secondary | ICD-10-CM | POA: Diagnosis not present

## 2015-06-12 DIAGNOSIS — R2689 Other abnormalities of gait and mobility: Secondary | ICD-10-CM | POA: Diagnosis not present

## 2015-06-12 DIAGNOSIS — M170 Bilateral primary osteoarthritis of knee: Secondary | ICD-10-CM | POA: Diagnosis not present

## 2015-06-12 DIAGNOSIS — M25561 Pain in right knee: Secondary | ICD-10-CM | POA: Diagnosis not present

## 2015-06-12 DIAGNOSIS — M1712 Unilateral primary osteoarthritis, left knee: Secondary | ICD-10-CM | POA: Diagnosis not present

## 2015-06-12 DIAGNOSIS — M25562 Pain in left knee: Secondary | ICD-10-CM | POA: Diagnosis not present

## 2015-06-13 DIAGNOSIS — I481 Persistent atrial fibrillation: Secondary | ICD-10-CM | POA: Diagnosis not present

## 2015-06-13 DIAGNOSIS — E782 Mixed hyperlipidemia: Secondary | ICD-10-CM | POA: Diagnosis not present

## 2015-06-19 DIAGNOSIS — M25562 Pain in left knee: Secondary | ICD-10-CM | POA: Diagnosis not present

## 2015-06-19 DIAGNOSIS — M170 Bilateral primary osteoarthritis of knee: Secondary | ICD-10-CM | POA: Diagnosis not present

## 2015-06-19 DIAGNOSIS — R2689 Other abnormalities of gait and mobility: Secondary | ICD-10-CM | POA: Diagnosis not present

## 2015-06-19 DIAGNOSIS — M25561 Pain in right knee: Secondary | ICD-10-CM | POA: Diagnosis not present

## 2015-06-19 DIAGNOSIS — M1711 Unilateral primary osteoarthritis, right knee: Secondary | ICD-10-CM | POA: Diagnosis not present

## 2015-06-21 DIAGNOSIS — E782 Mixed hyperlipidemia: Secondary | ICD-10-CM | POA: Diagnosis not present

## 2015-06-21 DIAGNOSIS — G609 Hereditary and idiopathic neuropathy, unspecified: Secondary | ICD-10-CM | POA: Diagnosis not present

## 2015-06-21 DIAGNOSIS — I481 Persistent atrial fibrillation: Secondary | ICD-10-CM | POA: Diagnosis not present

## 2015-06-21 DIAGNOSIS — M1A0790 Idiopathic chronic gout, unspecified ankle and foot, without tophus (tophi): Secondary | ICD-10-CM | POA: Diagnosis not present

## 2015-06-26 DIAGNOSIS — M25561 Pain in right knee: Secondary | ICD-10-CM | POA: Diagnosis not present

## 2015-06-26 DIAGNOSIS — M170 Bilateral primary osteoarthritis of knee: Secondary | ICD-10-CM | POA: Diagnosis not present

## 2015-06-26 DIAGNOSIS — M1712 Unilateral primary osteoarthritis, left knee: Secondary | ICD-10-CM | POA: Diagnosis not present

## 2015-06-26 DIAGNOSIS — R2689 Other abnormalities of gait and mobility: Secondary | ICD-10-CM | POA: Diagnosis not present

## 2015-06-26 DIAGNOSIS — M25562 Pain in left knee: Secondary | ICD-10-CM | POA: Diagnosis not present

## 2015-06-28 DIAGNOSIS — M1711 Unilateral primary osteoarthritis, right knee: Secondary | ICD-10-CM | POA: Diagnosis not present

## 2015-06-28 DIAGNOSIS — M25561 Pain in right knee: Secondary | ICD-10-CM | POA: Diagnosis not present

## 2015-06-28 DIAGNOSIS — R2689 Other abnormalities of gait and mobility: Secondary | ICD-10-CM | POA: Diagnosis not present

## 2015-06-28 DIAGNOSIS — M25562 Pain in left knee: Secondary | ICD-10-CM | POA: Diagnosis not present

## 2015-06-28 DIAGNOSIS — M170 Bilateral primary osteoarthritis of knee: Secondary | ICD-10-CM | POA: Diagnosis not present

## 2015-07-03 DIAGNOSIS — M170 Bilateral primary osteoarthritis of knee: Secondary | ICD-10-CM | POA: Diagnosis not present

## 2015-07-03 DIAGNOSIS — R2689 Other abnormalities of gait and mobility: Secondary | ICD-10-CM | POA: Diagnosis not present

## 2015-07-03 DIAGNOSIS — M1712 Unilateral primary osteoarthritis, left knee: Secondary | ICD-10-CM | POA: Diagnosis not present

## 2015-07-03 DIAGNOSIS — M25561 Pain in right knee: Secondary | ICD-10-CM | POA: Diagnosis not present

## 2015-07-03 DIAGNOSIS — M25562 Pain in left knee: Secondary | ICD-10-CM | POA: Diagnosis not present

## 2015-07-05 DIAGNOSIS — M25561 Pain in right knee: Secondary | ICD-10-CM | POA: Diagnosis not present

## 2015-07-05 DIAGNOSIS — M1711 Unilateral primary osteoarthritis, right knee: Secondary | ICD-10-CM | POA: Diagnosis not present

## 2015-07-09 DIAGNOSIS — R2689 Other abnormalities of gait and mobility: Secondary | ICD-10-CM | POA: Diagnosis not present

## 2015-07-09 DIAGNOSIS — M25561 Pain in right knee: Secondary | ICD-10-CM | POA: Diagnosis not present

## 2015-07-09 DIAGNOSIS — M170 Bilateral primary osteoarthritis of knee: Secondary | ICD-10-CM | POA: Diagnosis not present

## 2015-07-09 DIAGNOSIS — M25562 Pain in left knee: Secondary | ICD-10-CM | POA: Diagnosis not present

## 2015-09-24 DIAGNOSIS — K219 Gastro-esophageal reflux disease without esophagitis: Secondary | ICD-10-CM | POA: Diagnosis not present

## 2015-09-24 DIAGNOSIS — E785 Hyperlipidemia, unspecified: Secondary | ICD-10-CM | POA: Diagnosis not present

## 2015-09-24 DIAGNOSIS — Z7901 Long term (current) use of anticoagulants: Secondary | ICD-10-CM | POA: Diagnosis not present

## 2015-09-24 DIAGNOSIS — Z8546 Personal history of malignant neoplasm of prostate: Secondary | ICD-10-CM | POA: Diagnosis not present

## 2015-09-24 DIAGNOSIS — I482 Chronic atrial fibrillation: Secondary | ICD-10-CM | POA: Diagnosis not present

## 2015-10-22 DIAGNOSIS — R262 Difficulty in walking, not elsewhere classified: Secondary | ICD-10-CM | POA: Diagnosis not present

## 2015-10-22 DIAGNOSIS — M25562 Pain in left knee: Secondary | ICD-10-CM | POA: Diagnosis not present

## 2015-10-22 DIAGNOSIS — M170 Bilateral primary osteoarthritis of knee: Secondary | ICD-10-CM | POA: Diagnosis not present

## 2015-10-22 DIAGNOSIS — M25561 Pain in right knee: Secondary | ICD-10-CM | POA: Diagnosis not present

## 2015-10-23 DIAGNOSIS — C61 Malignant neoplasm of prostate: Secondary | ICD-10-CM | POA: Diagnosis not present

## 2015-10-29 DIAGNOSIS — M25561 Pain in right knee: Secondary | ICD-10-CM | POA: Diagnosis not present

## 2015-10-29 DIAGNOSIS — N35010 Post-traumatic urethral stricture, male, meatal: Secondary | ICD-10-CM | POA: Diagnosis not present

## 2015-10-29 DIAGNOSIS — C61 Malignant neoplasm of prostate: Secondary | ICD-10-CM | POA: Diagnosis not present

## 2015-10-29 DIAGNOSIS — M1711 Unilateral primary osteoarthritis, right knee: Secondary | ICD-10-CM | POA: Diagnosis not present

## 2015-11-01 DIAGNOSIS — M1712 Unilateral primary osteoarthritis, left knee: Secondary | ICD-10-CM | POA: Diagnosis not present

## 2015-11-01 DIAGNOSIS — M25562 Pain in left knee: Secondary | ICD-10-CM | POA: Diagnosis not present

## 2015-11-07 DIAGNOSIS — M1711 Unilateral primary osteoarthritis, right knee: Secondary | ICD-10-CM | POA: Diagnosis not present

## 2015-11-07 DIAGNOSIS — M25561 Pain in right knee: Secondary | ICD-10-CM | POA: Diagnosis not present

## 2015-11-09 DIAGNOSIS — M1712 Unilateral primary osteoarthritis, left knee: Secondary | ICD-10-CM | POA: Diagnosis not present

## 2015-11-09 DIAGNOSIS — M25562 Pain in left knee: Secondary | ICD-10-CM | POA: Diagnosis not present

## 2015-11-14 DIAGNOSIS — M25562 Pain in left knee: Secondary | ICD-10-CM | POA: Diagnosis not present

## 2015-11-14 DIAGNOSIS — M170 Bilateral primary osteoarthritis of knee: Secondary | ICD-10-CM | POA: Diagnosis not present

## 2015-11-14 DIAGNOSIS — M25561 Pain in right knee: Secondary | ICD-10-CM | POA: Diagnosis not present

## 2015-12-14 DIAGNOSIS — B029 Zoster without complications: Secondary | ICD-10-CM | POA: Diagnosis not present

## 2015-12-18 DIAGNOSIS — B029 Zoster without complications: Secondary | ICD-10-CM | POA: Diagnosis not present

## 2015-12-28 ENCOUNTER — Other Ambulatory Visit: Payer: Self-pay

## 2016-01-10 DIAGNOSIS — G609 Hereditary and idiopathic neuropathy, unspecified: Secondary | ICD-10-CM | POA: Diagnosis not present

## 2016-01-10 DIAGNOSIS — Z0000 Encounter for general adult medical examination without abnormal findings: Secondary | ICD-10-CM | POA: Diagnosis not present

## 2016-01-10 DIAGNOSIS — E782 Mixed hyperlipidemia: Secondary | ICD-10-CM | POA: Diagnosis not present

## 2016-01-10 DIAGNOSIS — Z23 Encounter for immunization: Secondary | ICD-10-CM | POA: Diagnosis not present

## 2016-01-10 DIAGNOSIS — I481 Persistent atrial fibrillation: Secondary | ICD-10-CM | POA: Diagnosis not present

## 2016-01-10 DIAGNOSIS — M1A0790 Idiopathic chronic gout, unspecified ankle and foot, without tophus (tophi): Secondary | ICD-10-CM | POA: Diagnosis not present

## 2016-01-17 DIAGNOSIS — Z23 Encounter for immunization: Secondary | ICD-10-CM | POA: Diagnosis not present

## 2016-01-17 DIAGNOSIS — E782 Mixed hyperlipidemia: Secondary | ICD-10-CM | POA: Diagnosis not present

## 2016-01-17 DIAGNOSIS — M150 Primary generalized (osteo)arthritis: Secondary | ICD-10-CM | POA: Diagnosis not present

## 2016-01-17 DIAGNOSIS — I481 Persistent atrial fibrillation: Secondary | ICD-10-CM | POA: Diagnosis not present

## 2016-01-17 DIAGNOSIS — G609 Hereditary and idiopathic neuropathy, unspecified: Secondary | ICD-10-CM | POA: Diagnosis not present

## 2016-03-05 ENCOUNTER — Ambulatory Visit (INDEPENDENT_AMBULATORY_CARE_PROVIDER_SITE_OTHER): Payer: Medicare Other | Admitting: Orthopedic Surgery

## 2016-03-05 ENCOUNTER — Encounter (INDEPENDENT_AMBULATORY_CARE_PROVIDER_SITE_OTHER): Payer: Self-pay | Admitting: Orthopedic Surgery

## 2016-03-05 DIAGNOSIS — M170 Bilateral primary osteoarthritis of knee: Secondary | ICD-10-CM

## 2016-03-05 MED ORDER — LIDOCAINE HCL 1 % IJ SOLN
5.0000 mL | INTRAMUSCULAR | Status: AC | PRN
  Administered 2016-03-05: 5 mL

## 2016-03-05 MED ORDER — METHYLPREDNISOLONE ACETATE 40 MG/ML IJ SUSP
40.0000 mg | INTRAMUSCULAR | Status: AC | PRN
  Administered 2016-03-05: 40 mg via INTRA_ARTICULAR

## 2016-03-05 MED ORDER — BUPIVACAINE HCL 0.25 % IJ SOLN
4.0000 mL | INTRAMUSCULAR | Status: AC | PRN
  Administered 2016-03-05: 4 mL via INTRA_ARTICULAR

## 2016-03-05 NOTE — Progress Notes (Signed)
   Procedure Note  Patient: George [name]             Date of Birth: George [name], 1925           MRN: [id number]             Visit Date: 03/05/2016  Procedures: Visit Diagnoses: Primary osteoarthritis of both knees George [name] is a 82 year old patient with bilateral knee pain.  Since I've seen him he has done flex to [name] 2.  He feels like were doing the same thing for him he reports in general that his knees still are painful with activity but he is managing with a cane.  Denies much in the way of pain is waking him from sleep at night Patient returns today complaining of bilateral knee swelling and pain.  Requests knee aspriations and cortisone injections again today.  The knees were last aspirated here in our office on 05/16/15. He had third Euflexxa done here in our office 04/2015.  He then went to [name], had the gel injections series done twice there.  They did not help him. The last injection at [name] was one month ago.        Large Joint Inj Date/Time: 03/05/2016 9:02 AM Performed by: George [name] Authorized by: [name]   Consent Given by:  Patient Site marked: the procedure site was marked   Timeout: prior to procedure the correct patient, procedure, and site was verified   Indications:  Pain, joint swelling and diagnostic evaluation Location:  Knee Site:  R knee Prep: patient was prepped and draped in usual sterile fashion   Needle Size:  18 G Needle Length:  1.5 inches Approach:  Superolateral Ultrasound Guidance: No   Fluoroscopic Guidance: No   Arthrogram: No Medications:  5 mL lidocaine 1 %; 4 mL bupivacaine 0.25 %; 40 mg methylPREDNISolone acetate 40 MG/ML Aspiration Attempted: Yes   Patient tolerance:  Patient tolerated the procedure well with no immediate complications Large Joint Inj Date/Time: 03/05/2016 9:03 AM Performed by: [facility] [name] Authorized by: [name]   Consent Given by:  Patient Site marked: the procedure site was marked     Timeout: prior to procedure the correct patient, procedure, and site was verified   Indications:  Pain, joint swelling and diagnostic evaluation Location:  Knee Site:  L knee Prep: patient was prepped and draped in usual sterile fashion   Needle Size:  18 G Needle Length:  1.5 inches Approach:  Superolateral Ultrasound Guidance: No   Fluoroscopic Guidance: No   Arthrogram: No Medications:  4 mL bupivacaine 0.25 %; 5 mL lidocaine 1 %; 40 mg methylPREDNISolone acetate 40 MG/ML Aspiration Attempted: Yes   Patient tolerance:  Patient tolerated the procedure well with no immediate complications    All systems reviewed negative as they relate to the chief complaint  On exam he is well-developed well-nourished distress alert and oriented to by [name] some antalgic gait to the left no groin pain with internal/external rotation leg palpable pedal pulses mild effusion in each knee collateral crucial ligaments are stable extensor mechanism is intact no other masses lymph adenopathy or skin vision in the knee region on the left right-hand side  Impression is bilateral knee arthritis symptomatic with mild effusions plan we will aspirate and inject each knee today.  Risks and benefits discussed.  We could get him on a every 3 month schedule.  Alternating with cortisone and gel injection see him back as needed

## 2016-05-23 DIAGNOSIS — J111 Influenza due to unidentified influenza virus with other respiratory manifestations: Secondary | ICD-10-CM | POA: Diagnosis not present

## 2016-05-28 DIAGNOSIS — R05 Cough: Secondary | ICD-10-CM | POA: Diagnosis not present

## 2016-05-28 DIAGNOSIS — J111 Influenza due to unidentified influenza virus with other respiratory manifestations: Secondary | ICD-10-CM | POA: Diagnosis not present

## 2016-05-28 DIAGNOSIS — J22 Unspecified acute lower respiratory infection: Secondary | ICD-10-CM | POA: Diagnosis not present

## 2016-06-17 ENCOUNTER — Encounter (INDEPENDENT_AMBULATORY_CARE_PROVIDER_SITE_OTHER): Payer: Self-pay | Admitting: Orthopedic Surgery

## 2016-06-17 ENCOUNTER — Ambulatory Visit (INDEPENDENT_AMBULATORY_CARE_PROVIDER_SITE_OTHER): Payer: Medicare Other | Admitting: Orthopedic Surgery

## 2016-06-17 DIAGNOSIS — M170 Bilateral primary osteoarthritis of knee: Secondary | ICD-10-CM | POA: Diagnosis not present

## 2016-06-17 DIAGNOSIS — H35372 Puckering of macula, left eye: Secondary | ICD-10-CM | POA: Diagnosis not present

## 2016-06-17 DIAGNOSIS — H01022 Squamous blepharitis right lower eyelid: Secondary | ICD-10-CM | POA: Diagnosis not present

## 2016-06-17 DIAGNOSIS — Z961 Presence of intraocular lens: Secondary | ICD-10-CM | POA: Diagnosis not present

## 2016-06-17 DIAGNOSIS — H01025 Squamous blepharitis left lower eyelid: Secondary | ICD-10-CM | POA: Diagnosis not present

## 2016-06-17 DIAGNOSIS — H01021 Squamous blepharitis right upper eyelid: Secondary | ICD-10-CM | POA: Diagnosis not present

## 2016-06-17 DIAGNOSIS — H01024 Squamous blepharitis left upper eyelid: Secondary | ICD-10-CM | POA: Diagnosis not present

## 2016-06-17 DIAGNOSIS — H40053 Ocular hypertension, bilateral: Secondary | ICD-10-CM | POA: Diagnosis not present

## 2016-06-17 MED ORDER — LIDOCAINE HCL 1 % IJ SOLN
5.0000 mL | INTRAMUSCULAR | Status: AC | PRN
  Administered 2016-06-17: 5 mL

## 2016-06-17 MED ORDER — METHYLPREDNISOLONE ACETATE 40 MG/ML IJ SUSP
40.0000 mg | INTRAMUSCULAR | Status: AC | PRN
  Administered 2016-06-17: 40 mg via INTRA_ARTICULAR

## 2016-06-17 MED ORDER — BUPIVACAINE HCL 0.25 % IJ SOLN
4.0000 mL | INTRAMUSCULAR | Status: AC | PRN
  Administered 2016-06-17: 4 mL via INTRA_ARTICULAR

## 2016-06-17 NOTE — Progress Notes (Signed)
Office Visit Note   Patient: George [name]           Date of Birth: [birth year]           MRN: [id number] Visit Date: 06/17/2016 Requested by: Merrilee Seashore, MD [address] [place],  [address] PCP: Merrilee Seashore, MD  Subjective: Chief Complaint  Patient presents with  . Right Knee - Pain, Edema  . Left Knee - Pain, Edema    HPI George [name] 83 year old patient with bilateral knee arthritis.  He doesn't really want knee replacement but has endstage arthritis in his knees.  He will come in and get alternating cortisone and Synvisc injections.  He states that he is learning how to live with the pain.              Review of Systems All systems reviewed are negative as they relate to the chief complaint within the history of present illness.  Patient denies  fevers or chills.    Assessment & Plan: Visit Diagnoses:  1. Primary osteoarthritis of both knees     Plan: Impression is bilateral knee pain and arthritis with effusion in both knees.  Bilateral knee aspiration is performed today.  We'll see how he does with that.  Does not really want to consider knee replacement which is understandable at his age.  I will see him back as needed  Follow-Up Instructions: No Follow-up on file.   Orders:  No orders of the defined types were placed in this encounter.  No orders of the defined types were placed in this encounter.     Procedures: Large Joint Inj Date/Time: 06/17/2016 4:07 PM Performed by: Duskin Pel Authorized by: Maka Pel   Consent Given by:  Patient Site marked: the procedure site was marked   Timeout: prior to procedure the correct patient, procedure, and site was verified   Indications:  Pain, joint swelling and diagnostic evaluation Location:  Knee Site:  R knee Prep: patient was prepped and draped in usual sterile fashion   Needle Size:  18 G Needle Length:  1.5 inches Approach:  Superolateral Ultrasound  Guidance: No   Fluoroscopic Guidance: No   Arthrogram: No   Medications:  5 mL lidocaine 1 %; 4 mL bupivacaine 0.25 %; 40 mg methylPREDNISolone acetate 40 MG/ML Aspiration Attempted: Yes   Aspirate amount (mL):  20 Aspirate:  Blood-tinged Patient tolerance:  Patient tolerated the procedure well with no immediate complications Large Joint Inj Date/Time: 06/17/2016 4:07 PM Performed by: Lysaght Pel Authorized by: Weger Pel   Consent Given by:  Patient Site marked: the procedure site was marked   Timeout: prior to procedure the correct patient, procedure, and site was verified   Indications:  Pain, joint swelling and diagnostic evaluation Location:  Knee Site:  L knee Prep: patient was prepped and draped in usual sterile fashion   Needle Size:  18 G Needle Length:  1.5 inches Approach:  Superolateral Ultrasound Guidance: No   Fluoroscopic Guidance: No   Arthrogram: No   Medications:  5 mL lidocaine 1 %; 4 mL bupivacaine 0.25 %; 40 mg methylPREDNISolone acetate 40 MG/ML Aspiration Attempted: Yes   Aspirate amount (mL):  30 Aspirate:  Yellow Patient tolerance:  Patient tolerated the procedure well with no immediate complications     Clinical Data: No additional findings.  Objective: Vital Signs: There were no vitals taken for this visit.  Physical Exam   Constitutional: Patient appears well-developed HEENT:  Head: Normocephalic  Eyes:EOM are normal Neck: Normal range of motion Cardiovascular: Normal rate Pulmonary/chest: Effort normal Neurologic: Patient is alert Skin: Skin is warm Psychiatric: Patient has normal mood and affect    Ortho Exam examination of the knees demonstrate effusion bilaterally with intact extensor mechanism palpable pedal pulses stable collateral crucial ligaments range of motion is pretty easily past 90 but he does have a flexion contracture in both knees.  There is no groin pain with internal/external rotation of either leg.   He has medial and lateral joint line tenderness  Specialty Comments:  No specialty comments available.  Imaging: No results found.   PMFS History: Patient Active Problem List   Diagnosis Date Noted  . Primary osteoarthritis of both knees 06/17/2016  . Esophageal ring 12/14/2014  . Chronic atrial fibrillation ([facility] [place]) 12/14/2014  . Chest pain 12/14/2014  . HLD (hyperlipidemia) 12/14/2014  . History of GI bleed 12/14/2014  . Peripheral neuropathy w/gait disturbance 12/14/2014  . Pain in the chest   . Paroxysmal atrial fibrillation ([place])   . Gastroesophageal reflux disease without esophagitis    Past Medical History:  Diagnosis Date  . BPH (benign prostatic hypertrophy)   . Chronic atrial fibrillation ([facility])    CHA2DS2VASC score 3    . Esophageal ring   . GERD (gastroesophageal reflux disease)   . History of GI bleed   . Hypercholesteremia   . Osteoarthritis   . Osteoporosis   . Peripheral neuropathy ([place] [place])   . Peripheral neuropathy ([place])   . Prostate cancer ([place])    treated with observation  . Trigger finger of both hands   . Vertigo   . Vitamin D deficiency     Family History  Problem Relation Age of Onset  . Cancer - Other Father     Past Surgical History:  Procedure Laterality Date  . BALLOON DILATION N/A 08/10/2013   Procedure: BALLOON DILATION;  Surgeon: Arta Silence, MD;  Location: [facility];  Service: Endoscopy;  Laterality: N/A;  . BOTOX INJECTION N/A 08/10/2013   Procedure: BOTOX INJECTION;  Surgeon: Arta Silence, MD;  Location: [facility];  Service: Endoscopy;  Laterality: N/A;  + or -      balloon/botox  . CARDIAC CATHETERIZATION    . CHOLECYSTECTOMY  2010  . ESOPHAGOGASTRODUODENOSCOPY (EGD) WITH PROPOFOL N/A 08/10/2013   Procedure: ESOPHAGOGASTRODUODENOSCOPY (EGD) WITH PROPOFOL;  Surgeon: Arta Silence, MD;  Location: [facility];  Service: Endoscopy;  Laterality: N/A;  . INGUINAL HERNIA REPAIR    . KNEE ARTHROCENTESIS    . skin cancer removed from  face  1 week ago   area healing well, some areas reved in past also  . TOTAL HIP ARTHROPLASTY Bilateral yrs ago   hips   Social History   Occupational History  . Not on file.   Social History Main Topics  . Smoking status: Never Smoker  . Smokeless tobacco: Never Used  . Alcohol use No  . Drug use: No  . Sexual activity: Not on file

## 2016-07-08 DIAGNOSIS — Z85828 Personal history of other malignant neoplasm of skin: Secondary | ICD-10-CM | POA: Diagnosis not present

## 2016-07-08 DIAGNOSIS — D692 Other nonthrombocytopenic purpura: Secondary | ICD-10-CM | POA: Diagnosis not present

## 2016-07-08 DIAGNOSIS — L814 Other melanin hyperpigmentation: Secondary | ICD-10-CM | POA: Diagnosis not present

## 2016-07-08 DIAGNOSIS — L570 Actinic keratosis: Secondary | ICD-10-CM | POA: Diagnosis not present

## 2016-07-08 DIAGNOSIS — D171 Benign lipomatous neoplasm of skin and subcutaneous tissue of trunk: Secondary | ICD-10-CM | POA: Diagnosis not present

## 2016-07-08 DIAGNOSIS — L821 Other seborrheic keratosis: Secondary | ICD-10-CM | POA: Diagnosis not present

## 2016-07-08 DIAGNOSIS — L738 Other specified follicular disorders: Secondary | ICD-10-CM | POA: Diagnosis not present

## 2016-07-08 DIAGNOSIS — L853 Xerosis cutis: Secondary | ICD-10-CM | POA: Diagnosis not present

## 2016-07-09 DIAGNOSIS — H05231 Hemorrhage of right orbit: Secondary | ICD-10-CM | POA: Diagnosis not present

## 2016-08-20 ENCOUNTER — Ambulatory Visit (INDEPENDENT_AMBULATORY_CARE_PROVIDER_SITE_OTHER): Payer: Medicare Other | Admitting: Orthopedic Surgery

## 2016-08-20 ENCOUNTER — Encounter (INDEPENDENT_AMBULATORY_CARE_PROVIDER_SITE_OTHER): Payer: Self-pay | Admitting: Orthopedic Surgery

## 2016-08-20 DIAGNOSIS — M170 Bilateral primary osteoarthritis of knee: Secondary | ICD-10-CM

## 2016-08-22 DIAGNOSIS — M170 Bilateral primary osteoarthritis of knee: Secondary | ICD-10-CM | POA: Diagnosis not present

## 2016-08-22 MED ORDER — LIDOCAINE HCL 1 % IJ SOLN
5.0000 mL | INTRAMUSCULAR | Status: AC | PRN
  Administered 2016-08-22: 5 mL

## 2016-08-22 NOTE — Progress Notes (Signed)
Office Visit Note   Patient: George [name]           Date of Birth: Jun 17, 1923           MRN: [id number] Visit Date: 08/20/2016 Requested by: Merrilee Seashore, MD [address] [place], [place] [address] PCP: Merrilee Seashore, MD  Subjective: No chief complaint on file.   HPI: George [name] is a 83 year old active patient with bilateral knee arthritis.  Had aspiration and injection of both knees 06/17/2016.  Reports recurrent pain and swelling.  He ambulates with a cane.  Reports generally constant pain in both knees.              ROS: All systems reviewed are negative as they relate to the chief complaint within the history of present illness.  Patient denies  fevers or chills.   Assessment & Plan: Visit Diagnoses:  1. Primary osteoarthritis of both knees     Plan: All systems reviewed are negative as they relate to the chief complaint within the history of present illness.  Patient denies  fevers or chills. Impression is bilateral knee pain.  Plan is aspiration today of both knees.  We could see him back in about 2 or 3 months if his symptoms recur and at that time we could repeat the cortisone injections.  Is still early to do that today.  Follow-Up Instructions: No Follow-up on file.   Orders:  No orders of the defined types were placed in this encounter.  No orders of the defined types were placed in this encounter.     Procedures: Large Joint Inj Date/Time: 08/22/2016 5:49 PM Performed by: Couillard Pel Authorized by: Gladwell Pel   Consent Given by:  Patient Site marked: the procedure site was marked   Timeout: prior to procedure the correct patient, procedure, and site was verified   Indications:  Pain, joint swelling and diagnostic evaluation Location:  Knee Site:  R knee Prep: patient was prepped and draped in usual sterile fashion   Needle Size:  18 G Needle Length:  1.5 inches Approach:  Superolateral Ultrasound Guidance: No     Fluoroscopic Guidance: No   Arthrogram: No   Medications:  5 mL lidocaine 1 % Aspiration Attempted: Yes   Aspirate amount (mL):  40 Patient tolerance:  Patient tolerated the procedure well with no immediate complications Large Joint Inj Date/Time: 08/22/2016 5:50 PM Performed by: Larsson Pel Authorized by: Defeo Pel   Consent Given by:  Patient Site marked: the procedure site was marked   Timeout: prior to procedure the correct patient, procedure, and site was verified   Indications:  Pain, joint swelling and diagnostic evaluation Location:  Knee Site:  L knee Prep: patient was prepped and draped in usual sterile fashion   Needle Size:  18 G Needle Length:  1.5 inches Approach:  Superolateral Ultrasound Guidance: No   Fluoroscopic Guidance: No   Arthrogram: No   Medications:  5 mL lidocaine 1 % Aspiration Attempted: Yes   Aspirate amount (mL):  5 Patient tolerance:  Patient tolerated the procedure well with no immediate complications     Clinical Data: No additional findings.  Objective: Vital Signs: There were no vitals taken for this visit.   Constitutional: Patient appears well-developed HEENT:  Head: Normocephalic Eyes:EOM are normal Neck: Normal range of motion Cardiovascular: Normal rate Pulmonary/chest: Effort normal Neurologic: Patient is alert Skin: Skin is warm Psychiatric: Patient has normal mood and affect    Ortho Exam:  Orthopedic exam demonstrates mild effusion in both knees with 5-8 flexion contractures in both knees.  Extensor mechanism is intact.  No groin pain with internal/external rotation of the leg.  No other masses lymph adenopathy or skin changes noted in the leg or knee region.  Range of motion is past 90 in flexion bilaterally  Specialty Comments:  No specialty comments available.  Imaging: No results found.   PMFS History: Patient Active Problem List   Diagnosis Date Noted  . Primary osteoarthritis of both  knees 06/17/2016  . Esophageal ring 12/14/2014  . Chronic atrial fibrillation ([facility]) 12/14/2014  . Chest pain 12/14/2014  . HLD (hyperlipidemia) 12/14/2014  . History of GI bleed 12/14/2014  . Peripheral neuropathy w/gait disturbance 12/14/2014  . Pain in the chest   . Paroxysmal atrial fibrillation ([facility] [facility])   . Gastroesophageal reflux disease without esophagitis    Past Medical History:  Diagnosis Date  . BPH (benign prostatic hypertrophy)   . Chronic atrial fibrillation ([facility])    CHA2DS2VASC score 3    . Esophageal ring   . GERD (gastroesophageal reflux disease)   . History of GI bleed   . Hypercholesteremia   . Osteoarthritis   . Osteoporosis   . Peripheral neuropathy   . Peripheral neuropathy   . Prostate cancer ([place])    treated with observation  . Trigger finger of both hands   . Vertigo   . Vitamin D deficiency     Family History  Problem Relation Age of Onset  . Cancer - Other Father     Past Surgical History:  Procedure Laterality Date  . BALLOON DILATION N/A 08/10/2013   Procedure: BALLOON DILATION;  Surgeon: Arta Silence, MD;  [facility];  Service: Endoscopy;  Laterality: N/A;  . BOTOX INJECTION N/A 08/10/2013   Procedure: BOTOX INJECTION;  Surgeon: Arta Silence, MD;  [facility];  Service: Endoscopy;  Laterality: N/A;  + or -      balloon/botox  . CARDIAC CATHETERIZATION    . CHOLECYSTECTOMY  2010  . ESOPHAGOGASTRODUODENOSCOPY (EGD) WITH PROPOFOL N/A 08/10/2013   Procedure: ESOPHAGOGASTRODUODENOSCOPY (EGD) WITH PROPOFOL;  Surgeon: Arta Silence, MD;  [facility];  Service: Endoscopy;  Laterality: N/A;  . INGUINAL HERNIA REPAIR    . KNEE ARTHROCENTESIS    . skin cancer removed from face  1 week ago   area healing well, some areas reved in past also  . TOTAL HIP ARTHROPLASTY Bilateral yrs ago   hips   Social History   Occupational History  . Not on file.   Social History Main Topics  . Smoking status: Never Smoker  .  Smokeless tobacco: Never Used  . Alcohol use No  . Drug use: No  . Sexual activity: Not on file

## 2016-09-18 DIAGNOSIS — I481 Persistent atrial fibrillation: Secondary | ICD-10-CM | POA: Diagnosis not present

## 2016-09-18 DIAGNOSIS — E782 Mixed hyperlipidemia: Secondary | ICD-10-CM | POA: Diagnosis not present

## 2016-09-22 DIAGNOSIS — K219 Gastro-esophageal reflux disease without esophagitis: Secondary | ICD-10-CM | POA: Diagnosis not present

## 2016-09-22 DIAGNOSIS — E785 Hyperlipidemia, unspecified: Secondary | ICD-10-CM | POA: Diagnosis not present

## 2016-09-22 DIAGNOSIS — I482 Chronic atrial fibrillation: Secondary | ICD-10-CM | POA: Diagnosis not present

## 2016-09-22 DIAGNOSIS — Z8546 Personal history of malignant neoplasm of prostate: Secondary | ICD-10-CM | POA: Diagnosis not present

## 2016-09-22 DIAGNOSIS — Z7901 Long term (current) use of anticoagulants: Secondary | ICD-10-CM | POA: Diagnosis not present

## 2016-09-25 DIAGNOSIS — I481 Persistent atrial fibrillation: Secondary | ICD-10-CM | POA: Diagnosis not present

## 2016-09-25 DIAGNOSIS — E782 Mixed hyperlipidemia: Secondary | ICD-10-CM | POA: Diagnosis not present

## 2016-09-25 DIAGNOSIS — G609 Hereditary and idiopathic neuropathy, unspecified: Secondary | ICD-10-CM | POA: Diagnosis not present

## 2016-09-25 DIAGNOSIS — N401 Enlarged prostate with lower urinary tract symptoms: Secondary | ICD-10-CM | POA: Diagnosis not present

## 2016-10-22 ENCOUNTER — Ambulatory Visit (INDEPENDENT_AMBULATORY_CARE_PROVIDER_SITE_OTHER): Payer: Medicare Other | Admitting: Orthopedic Surgery

## 2016-10-22 ENCOUNTER — Encounter (INDEPENDENT_AMBULATORY_CARE_PROVIDER_SITE_OTHER): Payer: Self-pay | Admitting: Orthopedic Surgery

## 2016-10-22 DIAGNOSIS — M170 Bilateral primary osteoarthritis of knee: Secondary | ICD-10-CM

## 2016-10-22 MED ORDER — LIDOCAINE HCL 1 % IJ SOLN
5.0000 mL | INTRAMUSCULAR | Status: AC | PRN
  Administered 2016-10-22: 5 mL

## 2016-10-22 MED ORDER — BUPIVACAINE HCL 0.25 % IJ SOLN
4.0000 mL | INTRAMUSCULAR | Status: AC | PRN
Start: 2016-10-22 — End: 2016-10-22
  Administered 2016-10-22: 4 mL via INTRA_ARTICULAR

## 2016-10-22 MED ORDER — METHYLPREDNISOLONE ACETATE 40 MG/ML IJ SUSP
40.0000 mg | INTRAMUSCULAR | Status: AC | PRN
  Administered 2016-10-22: 40 mg via INTRA_ARTICULAR

## 2016-10-22 MED ORDER — BUPIVACAINE HCL 0.25 % IJ SOLN
4.0000 mL | INTRAMUSCULAR | Status: AC | PRN
  Administered 2016-10-22: 4 mL via INTRA_ARTICULAR

## 2016-10-22 NOTE — Progress Notes (Signed)
   Procedure Note  Patient: George [name]             Date of Birth: 02-18-1924           MRN: [id number]             Visit Date: 10/22/2016  Procedures: Visit Diagnoses: Primary osteoarthritis of both knees  Large Joint Inj Date/Time: 10/22/2016 11:00 AM Performed by: Wiederhold Pel Authorized by: Madrazo Pel   Consent Given by:  Patient Site marked: the procedure site was marked   Timeout: prior to procedure the correct patient, procedure, and site was verified   Indications:  Pain, joint swelling and diagnostic evaluation Location:  Knee Site:  R knee Prep: patient was prepped and draped in usual sterile fashion   Needle Size:  18 G Needle Length:  1.5 inches Approach:  Superolateral Ultrasound Guidance: No   Fluoroscopic Guidance: No   Arthrogram: No   Medications:  5 mL lidocaine 1 %; 4 mL bupivacaine 0.25 %; 40 mg methylPREDNISolone acetate 40 MG/ML Patient tolerance:  Patient tolerated the procedure well with no immediate complications Large Joint Inj Date/Time: 10/22/2016 11:00 AM Performed by: Chagnon Pel Authorized by: Ac[place] Pel   Consent Given by:  Patient Site marked: the procedure site was marked   Timeout: prior to procedure the correct patient, procedure, and site was verified   Indications:  Pain, joint swelling and diagnostic evaluation Location:  Knee Site:  L knee Prep: patient was prepped and draped in usual sterile fashion   Needle Size:  18 G Needle Length:  1.5 inches Approach:  Superolateral Ultrasound Guidance: No   Fluoroscopic Guidance: No   Arthrogram: No   Medications:  5 mL lidocaine 1 %; 4 mL bupivacaine 0.25 %; 40 mg methylPREDNISolone acetate 40 MG/ML Patient tolerance:  Patient tolerated the procedure well with no immediate complications

## 2017-01-06 ENCOUNTER — Telehealth (INDEPENDENT_AMBULATORY_CARE_PROVIDER_SITE_OTHER): Payer: Self-pay | Admitting: Orthopedic Surgery

## 2017-01-06 NOTE — Telephone Encounter (Signed)
Returned call to patient left message to call back to schedule appt. with Dr Marlou Sa

## 2017-01-14 ENCOUNTER — Ambulatory Visit (INDEPENDENT_AMBULATORY_CARE_PROVIDER_SITE_OTHER): Payer: Medicare Other | Admitting: Orthopedic Surgery

## 2017-01-14 ENCOUNTER — Encounter (INDEPENDENT_AMBULATORY_CARE_PROVIDER_SITE_OTHER): Payer: Self-pay | Admitting: Orthopedic Surgery

## 2017-01-14 DIAGNOSIS — M170 Bilateral primary osteoarthritis of knee: Secondary | ICD-10-CM

## 2017-01-15 NOTE — Progress Notes (Signed)
No auth needed for Synvisc One injections done in office on 01/14/17.  Buy and bill.

## 2017-01-17 DIAGNOSIS — M170 Bilateral primary osteoarthritis of knee: Secondary | ICD-10-CM

## 2017-01-17 MED ORDER — HYLAN G-F 20 48 MG/6ML IX SOSY
48.0000 mg | PREFILLED_SYRINGE | INTRA_ARTICULAR | Status: AC | PRN
  Administered 2017-01-17: 48 mg via INTRA_ARTICULAR

## 2017-01-17 MED ORDER — LIDOCAINE HCL 1 % IJ SOLN
5.0000 mL | INTRAMUSCULAR | Status: AC | PRN
  Administered 2017-01-17: 5 mL

## 2017-01-17 MED ORDER — METHYLPREDNISOLONE ACETATE 40 MG/ML IJ SUSP
40.0000 mg | INTRAMUSCULAR | Status: AC | PRN
  Administered 2017-01-17: 40 mg via INTRA_ARTICULAR

## 2017-01-17 NOTE — Progress Notes (Signed)
   Procedure Note  Patient: George [name]             Date of Birth: 29-Mar-1924           MRN: [id number]             Visit Date: 01/14/2017  Procedures: Visit Diagnoses: Primary osteoarthritis of both knees  Large Joint Inj Date/Time: 01/17/2017 12:52 PM Performed by: Hebb Pel Authorized by: Tlatelpa Pel   Consent Given by:  Patient Site marked: the procedure site was marked   Timeout: prior to procedure the correct patient, procedure, and site was verified   Indications:  Pain, joint swelling and diagnostic evaluation Location:  Knee Site:  R knee Prep: patient was prepped and draped in usual sterile fashion   Needle Size:  18 G Needle Length:  1.5 inches Approach:  Superolateral Ultrasound Guidance: No   Fluoroscopic Guidance: No   Arthrogram: No   Medications:  5 mL lidocaine 1 %; 48 mg Hylan 48 MG/6ML Patient tolerance:  Patient tolerated the procedure well with no immediate complications Large Joint Inj Date/Time: 01/17/2017 12:53 PM Performed by: Kost Pel Authorized by: Hulsebus Pel   Consent Given by:  Patient Site marked: the procedure site was marked   Timeout: prior to procedure the correct patient, procedure, and site was verified   Indications:  Pain, joint swelling and diagnostic evaluation Location:  Knee Site:  L knee Prep: patient was prepped and draped in usual sterile fashion   Needle Size:  18 G Needle Length:  1.5 inches Approach:  Superolateral Ultrasound Guidance: No   Fluoroscopic Guidance: No   Arthrogram: No   Medications:  5 mL lidocaine 1 %; 40 mg methylPREDNISolone acetate 40 MG/ML; 48 mg Hylan 48 MG/6ML Patient tolerance:  Patient tolerated the procedure well with no immediate complications

## 2017-01-27 DIAGNOSIS — J069 Acute upper respiratory infection, unspecified: Secondary | ICD-10-CM | POA: Diagnosis not present

## 2017-02-17 DIAGNOSIS — R05 Cough: Secondary | ICD-10-CM | POA: Diagnosis not present

## 2017-02-17 DIAGNOSIS — J22 Unspecified acute lower respiratory infection: Secondary | ICD-10-CM | POA: Diagnosis not present

## 2017-03-03 DIAGNOSIS — Z23 Encounter for immunization: Secondary | ICD-10-CM | POA: Diagnosis not present

## 2017-03-06 DIAGNOSIS — G609 Hereditary and idiopathic neuropathy, unspecified: Secondary | ICD-10-CM | POA: Diagnosis not present

## 2017-03-06 DIAGNOSIS — E782 Mixed hyperlipidemia: Secondary | ICD-10-CM | POA: Diagnosis not present

## 2017-03-06 DIAGNOSIS — I481 Persistent atrial fibrillation: Secondary | ICD-10-CM | POA: Diagnosis not present

## 2017-03-12 DIAGNOSIS — Z0000 Encounter for general adult medical examination without abnormal findings: Secondary | ICD-10-CM | POA: Diagnosis not present

## 2017-03-12 DIAGNOSIS — M150 Primary generalized (osteo)arthritis: Secondary | ICD-10-CM | POA: Diagnosis not present

## 2017-03-12 DIAGNOSIS — M1A0790 Idiopathic chronic gout, unspecified ankle and foot, without tophus (tophi): Secondary | ICD-10-CM | POA: Diagnosis not present

## 2017-03-12 DIAGNOSIS — N401 Enlarged prostate with lower urinary tract symptoms: Secondary | ICD-10-CM | POA: Diagnosis not present

## 2017-03-12 DIAGNOSIS — G609 Hereditary and idiopathic neuropathy, unspecified: Secondary | ICD-10-CM | POA: Diagnosis not present

## 2017-03-12 DIAGNOSIS — E782 Mixed hyperlipidemia: Secondary | ICD-10-CM | POA: Diagnosis not present

## 2017-03-12 DIAGNOSIS — I481 Persistent atrial fibrillation: Secondary | ICD-10-CM | POA: Diagnosis not present

## 2017-06-01 ENCOUNTER — Ambulatory Visit (INDEPENDENT_AMBULATORY_CARE_PROVIDER_SITE_OTHER): Payer: Medicare Other | Admitting: Orthopedic Surgery

## 2017-06-01 DIAGNOSIS — M170 Bilateral primary osteoarthritis of knee: Secondary | ICD-10-CM

## 2017-06-02 ENCOUNTER — Encounter (INDEPENDENT_AMBULATORY_CARE_PROVIDER_SITE_OTHER): Payer: Self-pay | Admitting: Orthopedic Surgery

## 2017-06-02 DIAGNOSIS — M170 Bilateral primary osteoarthritis of knee: Secondary | ICD-10-CM | POA: Diagnosis not present

## 2017-06-02 MED ORDER — LIDOCAINE HCL 1 % IJ SOLN
5.0000 mL | INTRAMUSCULAR | Status: AC | PRN
  Administered 2017-06-02: 5 mL

## 2017-06-02 MED ORDER — TRIAMCINOLONE ACETONIDE 40 MG/ML IJ SUSP
40.0000 mg | INTRAMUSCULAR | Status: AC | PRN
  Administered 2017-06-02: 40 mg via INTRA_ARTICULAR

## 2017-06-02 MED ORDER — BUPIVACAINE HCL 0.25 % IJ SOLN
4.0000 mL | INTRAMUSCULAR | Status: AC | PRN
  Administered 2017-06-02: 4 mL via INTRA_ARTICULAR

## 2017-06-02 NOTE — Progress Notes (Signed)
   Procedure Note  Patient: George [name]             Date of Birth: 1923-06-12           MRN: [id number]             Visit Date: 06/01/2017  Procedures: Visit Diagnoses: Primary osteoarthritis of both knees  Large Joint Inj: bilateral knee on 06/02/2017 9:05 AM Indications: diagnostic evaluation, joint swelling and pain Details: 18 G 1.5 in needle, superolateral approach  Arthrogram: No  Medications (Right): 5 mL lidocaine 1 %; 40 mg triamcinolone acetonide 40 MG/ML; 4 mL bupivacaine 0.25 % Aspirate (Right): 10 mL yellow Medications (Left): 5 mL lidocaine 1 %; 40 mg triamcinolone acetonide 40 MG/ML; 4 mL bupivacaine 0.25 % Aspirate (Left): 20 mL yellow Outcome: tolerated well, no immediate complications Procedure, treatment alternatives, risks and benefits explained, specific risks discussed. Consent was given by the patient. Immediately prior to procedure a time out was called to verify the correct patient, procedure, equipment, support staff and site/side marked as required. Patient was prepped and draped in the usual sterile fashion.

## 2017-06-19 DIAGNOSIS — H40053 Ocular hypertension, bilateral: Secondary | ICD-10-CM | POA: Diagnosis not present

## 2017-06-19 DIAGNOSIS — H05231 Hemorrhage of right orbit: Secondary | ICD-10-CM | POA: Diagnosis not present

## 2017-06-19 DIAGNOSIS — H01024 Squamous blepharitis left upper eyelid: Secondary | ICD-10-CM | POA: Diagnosis not present

## 2017-06-19 DIAGNOSIS — H01025 Squamous blepharitis left lower eyelid: Secondary | ICD-10-CM | POA: Diagnosis not present

## 2017-06-19 DIAGNOSIS — H01021 Squamous blepharitis right upper eyelid: Secondary | ICD-10-CM | POA: Diagnosis not present

## 2017-06-19 DIAGNOSIS — H01022 Squamous blepharitis right lower eyelid: Secondary | ICD-10-CM | POA: Diagnosis not present

## 2017-06-19 DIAGNOSIS — Z961 Presence of intraocular lens: Secondary | ICD-10-CM | POA: Diagnosis not present

## 2017-06-19 DIAGNOSIS — H35372 Puckering of macula, left eye: Secondary | ICD-10-CM | POA: Diagnosis not present

## 2017-07-07 DIAGNOSIS — D170 Benign lipomatous neoplasm of skin and subcutaneous tissue of head, face and neck: Secondary | ICD-10-CM | POA: Diagnosis not present

## 2017-08-17 DIAGNOSIS — R05 Cough: Secondary | ICD-10-CM | POA: Diagnosis not present

## 2017-08-17 DIAGNOSIS — R112 Nausea with vomiting, unspecified: Secondary | ICD-10-CM | POA: Diagnosis not present

## 2017-08-17 DIAGNOSIS — M1A0790 Idiopathic chronic gout, unspecified ankle and foot, without tophus (tophi): Secondary | ICD-10-CM | POA: Diagnosis not present

## 2017-08-17 DIAGNOSIS — R5383 Other fatigue: Secondary | ICD-10-CM | POA: Diagnosis not present

## 2017-08-17 DIAGNOSIS — J411 Mucopurulent chronic bronchitis: Secondary | ICD-10-CM | POA: Diagnosis not present

## 2017-08-20 ENCOUNTER — Encounter (INDEPENDENT_AMBULATORY_CARE_PROVIDER_SITE_OTHER): Payer: Self-pay | Admitting: Orthopedic Surgery

## 2017-08-20 ENCOUNTER — Ambulatory Visit (INDEPENDENT_AMBULATORY_CARE_PROVIDER_SITE_OTHER): Payer: Medicare Other | Admitting: Orthopedic Surgery

## 2017-08-20 ENCOUNTER — Ambulatory Visit (INDEPENDENT_AMBULATORY_CARE_PROVIDER_SITE_OTHER): Payer: Medicare Other

## 2017-08-20 ENCOUNTER — Telehealth (INDEPENDENT_AMBULATORY_CARE_PROVIDER_SITE_OTHER): Payer: Self-pay | Admitting: Orthopedic Surgery

## 2017-08-20 DIAGNOSIS — M25522 Pain in left elbow: Secondary | ICD-10-CM | POA: Diagnosis not present

## 2017-08-20 NOTE — Telephone Encounter (Signed)
Pt fell injured left elbow having pain in area and would like to be seen asap

## 2017-08-20 NOTE — Telephone Encounter (Signed)
IC patient and s/w him. Before I could call him back, he had called and spoke to someone else to make an appt with Dr Ninfa Linden b/c front desk had advised him Dr Marlou Sa did not have any openings until 05/02. That was correct he did not, but I did work patient in to see Dr Marlou Sa and cx appt with Dr Ninfa Linden.

## 2017-08-22 ENCOUNTER — Encounter (INDEPENDENT_AMBULATORY_CARE_PROVIDER_SITE_OTHER): Payer: Self-pay | Admitting: Orthopedic Surgery

## 2017-08-22 NOTE — Progress Notes (Signed)
Office Visit Note   Patient: George [name]           Date of Birth: 1924-04-03           MRN: [id number] Visit Date: 08/20/2017 Requested by: Merrilee Seashore, [address] [place], [place] [address] PCP: Merrilee Seashore, MD  Subjective: Chief Complaint  Patient presents with  . Left Elbow - Injury    HPI: Client is a patient with left elbow pain.  Injured his left elbow 08/17/2017 after a fall on his elbow patient is right-hand dominant.  He does report some swelling and bruising.              ROS: All systems reviewed are negative as they relate to the chief complaint within the history of present illness.  Patient denies  fevers or chills.   Assessment & Plan: Visit Diagnoses:  1. Pain in left elbow     Plan: Impression is left elbow pain with some medial bruising.  No evidence of fracture.  It could be that he had a subluxation of the elbow.  Nonetheless his range of motion is good today and there is no fracture on radiographs.  Range of motion activity as tolerated.  Follow-up as needed  Follow-Up Instructions: Return if symptoms worsen or fail to improve.   Orders:  Orders Placed This Encounter  Procedures  . XR Elbow Complete Left (3+View)   No orders of the defined types were placed in this encounter.     Procedures: No procedures performed   Clinical Data: No additional findings.  Objective: Vital Signs: There were no vitals taken for this visit.  Physical Exam:   Constitutional: Patient appears well-developed HEENT:  Head: Normocephalic Eyes:EOM are normal Neck: Normal range of motion Cardiovascular: Normal rate Pulmonary/chest: Effort normal Neurologic: Patient is alert Skin: Skin is warm Psychiatric: Patient has normal mood and affect    Ortho Exam: Orthopedic exam demonstrates pretty reasonable flexion extension.  His biceps tendon is palpable and functional.  Triceps tendon is palpable and functional.  Strength  is reasonable.  No coarse grinding or crepitus with active or passive range of motion of that left arm.  No discrete tenderness on the medial or lateral condyle.  Specialty Comments:  No specialty comments available.  Imaging: No results found.   PMFS History: Patient Active Problem List   Diagnosis Date Noted  . Primary osteoarthritis of both knees 06/17/2016  . Esophageal ring 12/14/2014  . Chronic atrial fibrillation ([facility]) 12/14/2014  . Chest pain 12/14/2014  . HLD (hyperlipidemia) 12/14/2014  . History of GI bleed 12/14/2014  . Peripheral neuropathy w/gait disturbance 12/14/2014  . Pain in the chest   . Paroxysmal atrial fibrillation ([facility])   . Gastroesophageal reflux disease without esophagitis    Past Medical History:  Diagnosis Date  . BPH (benign prostatic hypertrophy)   . Chronic atrial fibrillation ([facility])    CHA2DS2VASC score 3    . Esophageal ring   . GERD (gastroesophageal reflux disease)   . History of GI bleed   . Hypercholesteremia   . Osteoarthritis   . Osteoporosis   . Peripheral neuropathy   . Peripheral neuropathy   . Prostate cancer ([facility])    treated with observation  . Trigger finger of both hands   . Vertigo   . Vitamin D deficiency     Family History  Problem Relation Age of Onset  . Cancer - Other Father     Past Surgical History:  Procedure Laterality Date  . BALLOON DILATION N/A 08/10/2013   Procedure: BALLOON DILATION;  Surgeon: Arta Silence, MD;  [facility];  Service: Endoscopy;  Laterality: N/A;  . BOTOX INJECTION N/A 08/10/2013   Procedure: BOTOX INJECTION;  Surgeon: Arta Silence, MD;  [facility];  Service: Endoscopy;  Laterality: N/A;  + or -      balloon/botox  . CARDIAC CATHETERIZATION    . CHOLECYSTECTOMY  2010  . ESOPHAGOGASTRODUODENOSCOPY (EGD) WITH PROPOFOL N/A 08/10/2013   Procedure: ESOPHAGOGASTRODUODENOSCOPY (EGD) WITH PROPOFOL;  Surgeon: Arta Silence, MD;  [facility];  Service: Endoscopy;   Laterality: N/A;  . INGUINAL HERNIA REPAIR    . KNEE ARTHROCENTESIS    . skin cancer removed from face  1 week ago   area healing well, some areas reved in past also  . TOTAL HIP ARTHROPLASTY Bilateral yrs ago   hips   Social History   Occupational History  . Not on file  Tobacco Use  . Smoking status: Never Smoker  . Smokeless tobacco: Never Used  Substance and Sexual Activity  . Alcohol use: No  . Drug use: No  . Sexual activity: Not on file

## 2017-08-24 ENCOUNTER — Telehealth (INDEPENDENT_AMBULATORY_CARE_PROVIDER_SITE_OTHER): Payer: Self-pay | Admitting: Orthopedic Surgery

## 2017-08-24 DIAGNOSIS — M25522 Pain in left elbow: Secondary | ICD-10-CM

## 2017-08-24 NOTE — Telephone Encounter (Signed)
Patient was seen last Thursday 4/18 and was told to call back today if his elbow wasn't any better. Please call him back to let him know the next steps. # [phone]

## 2017-08-24 NOTE — Telephone Encounter (Signed)
Ct scan elbow - r/o occult fracture pls calal thx

## 2017-08-24 NOTE — Telephone Encounter (Signed)
Please advise next step.  Thanks!

## 2017-08-24 NOTE — Telephone Encounter (Signed)
IC advised per Dr Marlou Sa

## 2017-08-26 ENCOUNTER — Ambulatory Visit
Admission: RE | Admit: 2017-08-26 | Discharge: 2017-08-26 | Disposition: A | Discharge status: Home / Self Care | Payer: Medicare Other | Source: Ambulatory Visit | Attending: Orthopedic Surgery | Admitting: Orthopedic Surgery

## 2017-08-26 ENCOUNTER — Telehealth (INDEPENDENT_AMBULATORY_CARE_PROVIDER_SITE_OTHER): Payer: Self-pay | Admitting: Orthopedic Surgery

## 2017-08-26 DIAGNOSIS — M25522 Pain in left elbow: Secondary | ICD-10-CM

## 2017-08-26 DIAGNOSIS — M25422 Effusion, left elbow: Secondary | ICD-10-CM | POA: Diagnosis not present

## 2017-08-26 NOTE — Telephone Encounter (Signed)
Patient left voicemail in regards to his referral to a specialist and would like a call back. CB # [phone]

## 2017-08-26 NOTE — Telephone Encounter (Signed)
Patient would like to know the status of his CT being scheduled. Can you advise?

## 2017-08-26 NOTE — Telephone Encounter (Signed)
Pt is scheduled for today 08/26/17 at 1:50pm with [facility]

## 2017-08-27 ENCOUNTER — Encounter (INDEPENDENT_AMBULATORY_CARE_PROVIDER_SITE_OTHER): Payer: Self-pay | Admitting: Radiology

## 2017-08-27 ENCOUNTER — Ambulatory Visit (INDEPENDENT_AMBULATORY_CARE_PROVIDER_SITE_OTHER): Payer: Medicare Other | Admitting: Orthopaedic Surgery

## 2017-08-27 ENCOUNTER — Telehealth (INDEPENDENT_AMBULATORY_CARE_PROVIDER_SITE_OTHER): Payer: Self-pay | Admitting: Radiology

## 2017-08-27 ENCOUNTER — Encounter (INDEPENDENT_AMBULATORY_CARE_PROVIDER_SITE_OTHER): Payer: Self-pay

## 2017-08-27 ENCOUNTER — Ambulatory Visit (INDEPENDENT_AMBULATORY_CARE_PROVIDER_SITE_OTHER): Payer: Medicare Other | Admitting: Orthopedic Surgery

## 2017-08-27 DIAGNOSIS — M25522 Pain in left elbow: Secondary | ICD-10-CM

## 2017-08-27 NOTE — Telephone Encounter (Signed)
IC patient LMVM to call me back, need to discuss CT findings with him.

## 2017-08-27 NOTE — Telephone Encounter (Signed)
I spoke with patient and he will come this AM to get the sling, and will make the appt,.

## 2017-08-27 NOTE — Progress Notes (Signed)
Please call patient with results. Thanks - has occult fracture - needs sling immobilization for 10 days then rom ok pls clal thx rov 2 weeks - no pressing with left arm

## 2017-08-27 NOTE — Telephone Encounter (Signed)
Patient came in and was fitted in sling. George [name] advised him to wear for 10 days, and not to wtb/put pressure on that arm.

## 2017-08-27 NOTE — Telephone Encounter (Signed)
-----   Message from Tumminello Pel, MD sent at 08/27/2017  8:42 AM EDT ----- Please call patient with results. Thanks - has occult fracture - needs sling immobilization for 10 days then rom ok pls clal thx rov 2 weeks - no pressing with left arm

## 2017-08-28 ENCOUNTER — Telehealth (INDEPENDENT_AMBULATORY_CARE_PROVIDER_SITE_OTHER): Payer: Self-pay

## 2017-08-28 NOTE — Telephone Encounter (Signed)
Patient returned call,  Advised him of message per Wendy May.  Patient understands.

## 2017-08-28 NOTE — Progress Notes (Signed)
Sling given to patient in office today.

## 2017-08-28 NOTE — Telephone Encounter (Signed)
IC and LMVM home and advised.  NA several times to cell phone.

## 2017-08-28 NOTE — Telephone Encounter (Signed)
IC patient and got NA, need to advise him to wear sling x 10 days, then ok for ROM and use of arm.  No pushing down/weight bearing on that arm.  Followup with Dr Marlou Sa in 2 weeks.  Will call him back.

## 2017-08-28 NOTE — Telephone Encounter (Signed)
Patient wanted to know where there any restrictions for his left elbow?  I advised patient of message per Dr. Marlou Sa concerning his left elbow.  Cb# is [phone].  Please advise.  Thank you.

## 2017-09-10 ENCOUNTER — Encounter (INDEPENDENT_AMBULATORY_CARE_PROVIDER_SITE_OTHER): Payer: Self-pay | Admitting: Orthopedic Surgery

## 2017-09-10 ENCOUNTER — Ambulatory Visit (INDEPENDENT_AMBULATORY_CARE_PROVIDER_SITE_OTHER): Payer: Medicare Other | Admitting: Orthopedic Surgery

## 2017-09-10 DIAGNOSIS — M25522 Pain in left elbow: Secondary | ICD-10-CM

## 2017-09-13 ENCOUNTER — Encounter (INDEPENDENT_AMBULATORY_CARE_PROVIDER_SITE_OTHER): Payer: Self-pay | Admitting: Orthopedic Surgery

## 2017-09-13 NOTE — Progress Notes (Signed)
Office Visit Note   Patient: George [name]           Date of Birth: February 12, 1924           MRN: [id number] Visit Date: 09/10/2017 Requested by: Merrilee Seashore, [place],  [id number] PCP: Merrilee Seashore, MD  Subjective: Chief Complaint  Patient presents with  . Left Elbow - Follow-up, Pain    HPI: [place] is a patient with left elbow pain.  Here to follow-up radial head fracture.  Date of injury 08/17/2017.  Radial head fracture was not definitive on CT scan.  He states he is doing some better.  He states "I have learned to cope and adjust".              ROS: All systems reviewed are negative as they relate to the chief complaint within the history of present illness.  Patient denies  fevers or chills.   Assessment & Plan: Visit Diagnoses:  1. Pain in left elbow     Plan: Impression is left elbow pain with possible occult radial neck fracture which is pretty underwhelming on CT scan.  He is 3 weeks out from injury and therefore I would like for him to begin range of motion exercises and discontinue use of the sling.  4-week return for final clinical recheck and release.  Follow-Up Instructions: Return in about 1 month (around 10/08/2017).   Orders:  No orders of the defined types were placed in this encounter.  No orders of the defined types were placed in this encounter.     Procedures: No procedures performed   Clinical Data: No additional findings.  Objective: Vital Signs: There were no vitals taken for this visit.  Physical Exam:   Constitutional: Patient appears well-developed HEENT:  Head: Normocephalic Eyes:EOM are normal Neck: Normal range of motion Cardiovascular: Normal rate Pulmonary/chest: Effort normal Neurologic: Patient is alert Skin: Skin is warm Psychiatric: Patient has normal mood and affect    Ortho Exam: Orthopedic exam demonstrates full active and passive range of motion of the right elbow.   Left elbow lacks only about 5 degrees of full extension full flexion.  No coarse grinding or crepitus with pronation supination.  Motor or sensory to the hand is intact.  Specialty Comments:  No specialty comments available.  Imaging: No results found.   PMFS History: Patient Active Problem List   Diagnosis Date Noted  . Primary osteoarthritis of both knees 06/17/2016  . Esophageal ring 12/14/2014  . Chronic atrial fibrillation ([facility]) 12/14/2014  . Chest pain 12/14/2014  . HLD (hyperlipidemia) 12/14/2014  . History of GI bleed 12/14/2014  . Peripheral neuropathy w/gait disturbance 12/14/2014  . Pain in the chest   . Paroxysmal atrial fibrillation ([facility])   . Gastroesophageal reflux disease without esophagitis    Past Medical History:  Diagnosis Date  . BPH (benign prostatic hypertrophy)   . Chronic atrial fibrillation (HCC)    CHA2DS2VASC score 3    . Esophageal ring   . GERD (gastroesophageal reflux disease)   . History of GI bleed   . Hypercholesteremia   . Osteoarthritis   . Osteoporosis   . Peripheral neuropathy   . Peripheral neuropathy   . Prostate cancer ([facility])    treated with observation  . Trigger finger of both hands   . Vertigo   . Vitamin D deficiency     Family History  Problem Relation Age of Onset  . Cancer - Other Father  Past Surgical History:  Procedure Laterality Date  . BALLOON DILATION N/A 08/10/2013   Procedure: BALLOON DILATION;  Surgeon: Arta Silence, MD;  Location: [facility];  Service: Endoscopy;  Laterality: N/A;  . BOTOX INJECTION N/A 08/10/2013   Procedure: BOTOX INJECTION;  Surgeon: Arta Silence, MD;  [facility];  Service: Endoscopy;  Laterality: N/A;  + or -      balloon/botox  . CARDIAC CATHETERIZATION    . CHOLECYSTECTOMY  2010  . ESOPHAGOGASTRODUODENOSCOPY (EGD) WITH PROPOFOL N/A 08/10/2013   Procedure: ESOPHAGOGASTRODUODENOSCOPY (EGD) WITH PROPOFOL;  Surgeon: Arta Silence, MD;  [facility];  Service:  Endoscopy;  Laterality: N/A;  . INGUINAL HERNIA REPAIR    . KNEE ARTHROCENTESIS    . skin cancer removed from face  1 week ago   area healing well, some areas reved in past also  . TOTAL HIP ARTHROPLASTY Bilateral yrs ago   hips   Social History   Occupational History  . Not on file  Tobacco Use  . Smoking status: Never Smoker  . Smokeless tobacco: Never Used  Substance and Sexual Activity  . Alcohol use: No  . Drug use: No  . Sexual activity: Not on file

## 2017-09-22 DIAGNOSIS — Z8546 Personal history of malignant neoplasm of prostate: Secondary | ICD-10-CM | POA: Diagnosis not present

## 2017-09-22 DIAGNOSIS — K219 Gastro-esophageal reflux disease without esophagitis: Secondary | ICD-10-CM | POA: Diagnosis not present

## 2017-09-22 DIAGNOSIS — I482 Chronic atrial fibrillation: Secondary | ICD-10-CM | POA: Diagnosis not present

## 2017-09-22 DIAGNOSIS — E785 Hyperlipidemia, unspecified: Secondary | ICD-10-CM | POA: Diagnosis not present

## 2017-09-22 DIAGNOSIS — Z7901 Long term (current) use of anticoagulants: Secondary | ICD-10-CM | POA: Diagnosis not present

## 2017-10-15 ENCOUNTER — Encounter (INDEPENDENT_AMBULATORY_CARE_PROVIDER_SITE_OTHER): Payer: Self-pay | Admitting: Orthopedic Surgery

## 2017-10-15 ENCOUNTER — Ambulatory Visit (INDEPENDENT_AMBULATORY_CARE_PROVIDER_SITE_OTHER): Payer: Medicare Other | Admitting: Orthopedic Surgery

## 2017-10-15 ENCOUNTER — Ambulatory Visit (INDEPENDENT_AMBULATORY_CARE_PROVIDER_SITE_OTHER): Payer: Medicare Other

## 2017-10-15 DIAGNOSIS — Z96643 Presence of artificial hip joint, bilateral: Secondary | ICD-10-CM

## 2017-10-15 DIAGNOSIS — M174 Other bilateral secondary osteoarthritis of knee: Secondary | ICD-10-CM | POA: Diagnosis not present

## 2017-10-15 MED ORDER — METHYLPREDNISOLONE ACETATE 40 MG/ML IJ SUSP
40.0000 mg | INTRAMUSCULAR | Status: AC | PRN
  Administered 2017-10-15: 40 mg via INTRA_ARTICULAR

## 2017-10-15 MED ORDER — BUPIVACAINE HCL 0.25 % IJ SOLN
4.0000 mL | INTRAMUSCULAR | Status: AC | PRN
  Administered 2017-10-15: 4 mL via INTRA_ARTICULAR

## 2017-10-15 MED ORDER — LIDOCAINE HCL 1 % IJ SOLN
5.0000 mL | INTRAMUSCULAR | Status: AC | PRN
  Administered 2017-10-15: 5 mL

## 2017-10-15 NOTE — Progress Notes (Signed)
Office Visit Note   Patient: George [name]           Date of Birth: 03/25/1924           MRN: [id number] Visit Date: 10/15/2017 Requested by: Merrilee Seashore, [address] [place], [place] [address] PCP: Merrilee Seashore, MD  Subjective: Chief Complaint  Patient presents with  . Left Elbow - Follow-up  . Left Knee - Pain  . Right Knee - Pain    HPI: George [name] is a patient here for one-month follow-up left elbow radial head fracture.  Date of injury 08/17/2017.  He is been doing well with his elbow and has no complaints there.  Patient also has bilateral knee arthritis and would like to have both knees aspirated and injected.  Patient also reports some hip pain after the fall.  He has had previous bilateral total hip replacements.  Has had some pain in the hip since the fall.  Denies any numbness and tingling.  Denies any instability in the hips.              ROS: All systems reviewed are negative as they relate to the chief complaint within the history of present illness.  Patient denies  fevers or chills.   Assessment & Plan: Visit Diagnoses:  1. History of total replacement of both hip joints     Plan: Impression is pretty normal bilateral hip radiographs with not much on exam.  I would wait this 1 out in terms of the hips.  Elbow has full range of motion on the left-hand side.  Both knees aspirated and injected today.  I will see him back as needed.  Follow-Up Instructions: No follow-ups on file.   Orders:  Orders Placed This Encounter  Procedures  . XR HIPS BILAT W OR W/O PELVIS 3-4 VIEWS   No orders of the defined types were placed in this encounter.     Procedures: Large Joint Inj: bilateral knee on 10/15/2017 11:08 AM Indications: diagnostic evaluation, joint swelling and pain Details: 18 G 1.5 in needle, superolateral approach  Arthrogram: No  Medications (Right): 5 mL lidocaine 1 %; 40 mg methylPREDNISolone acetate 40 MG/ML; 4 mL  bupivacaine 0.25 % Medications (Left): 5 mL lidocaine 1 %; 4 mL bupivacaine 0.25 %; 40 mg methylPREDNISolone acetate 40 MG/ML Outcome: tolerated well, no immediate complications Procedure, treatment alternatives, risks and benefits explained, specific risks discussed. Consent was given by the patient. Immediately prior to procedure a time out was called to verify the correct patient, procedure, equipment, support staff and site/side marked as required. Patient was prepped and draped in the usual sterile fashion.       Clinical Data: No additional findings.  Objective: Vital Signs: There were no vitals taken for this visit.  Physical Exam:   Constitutional: Patient appears well-developed HEENT:  Head: Normocephalic Eyes:EOM are normal Neck: Normal range of motion Cardiovascular: Normal rate Pulmonary/chest: Effort normal Neurologic: Patient is alert Skin: Skin is warm Psychiatric: Patient has normal mood and affect    Ortho Exam: Ortho exam demonstrates no real groin pain with internal and external rotation of either leg.  Both knees have trace effusion and mild medial and lateral joint line tenderness with palpable pedal pulses.  Left elbow has full range of motion flexion extension pronation supination with no lateral tenderness to palpation.  Specialty Comments:  No specialty comments available.  Imaging: Xr Hips Bilat W Or W/o Pelvis 3-4 Views  Result Date: 10/15/2017 AP  pelvis lateral bilateral hips reviewed.  Total hip prosthesis cemented in good position and alignment with no evidence of loosening or fracture.  No significant asymmetric wear on the liners.  No other fracture seen in the bony pelvis.    PMFS History: Patient Active Problem List   Diagnosis Date Noted  . Primary osteoarthritis of both knees 06/17/2016  . Esophageal ring 12/14/2014  . Chronic atrial fibrillation ([facility]) 12/14/2014  . Chest pain 12/14/2014  . HLD (hyperlipidemia) 12/14/2014  .  History of GI bleed 12/14/2014  . Peripheral neuropathy w/gait disturbance 12/14/2014  . Pain in the chest   . Paroxysmal atrial fibrillation ([facility])   . Gastroesophageal reflux disease without esophagitis    Past Medical History:  Diagnosis Date  . BPH (benign prostatic hypertrophy)   . Chronic atrial fibrillation (HCC)    CHA2DS2VASC score 3    . Esophageal ring   . GERD (gastroesophageal reflux disease)   . History of GI bleed   . Hypercholesteremia   . Osteoarthritis   . Osteoporosis   . Peripheral neuropathy   . Peripheral neuropathy   . Prostate cancer ([facility])    treated with observation  . Trigger finger of both hands   . Vertigo   . Vitamin D deficiency     Family History  Problem Relation Age of Onset  . Cancer - Other Father     Past Surgical History:  Procedure Laterality Date  . BALLOON DILATION N/A 08/10/2013   Procedure: BALLOON DILATION;  Surgeon: Arta Silence, MD;  [facility];  Service: Endoscopy;  Laterality: N/A;  . BOTOX INJECTION N/A 08/10/2013   Procedure: BOTOX INJECTION;  Surgeon: Arta Silence, MD;  [facility];  Service: Endoscopy;  Laterality: N/A;  + or -      balloon/botox  . CARDIAC CATHETERIZATION    . CHOLECYSTECTOMY  2010  . ESOPHAGOGASTRODUODENOSCOPY (EGD) WITH PROPOFOL N/A 08/10/2013   Procedure: ESOPHAGOGASTRODUODENOSCOPY (EGD) WITH PROPOFOL;  Surgeon: Arta Silence, MD;  [facility];  Service: Endoscopy;  Laterality: N/A;  . INGUINAL HERNIA REPAIR    . KNEE ARTHROCENTESIS    . skin cancer removed from face  1 week ago   area healing well, some areas reved in past also  . TOTAL HIP ARTHROPLASTY Bilateral yrs ago   hips   Social History   Occupational History  . Not on file  Tobacco Use  . Smoking status: Never Smoker  . Smokeless tobacco: Never Used  Substance and Sexual Activity  . Alcohol use: No  . Drug use: No  . Sexual activity: Not on file

## 2017-10-19 ENCOUNTER — Telehealth (INDEPENDENT_AMBULATORY_CARE_PROVIDER_SITE_OTHER): Payer: Self-pay

## 2017-10-19 NOTE — Telephone Encounter (Signed)
Submitted application online for SynviscOne, bilateral knee. 

## 2017-10-28 ENCOUNTER — Encounter (INDEPENDENT_AMBULATORY_CARE_PROVIDER_SITE_OTHER): Payer: Self-pay | Admitting: Radiology

## 2017-10-28 ENCOUNTER — Ambulatory Visit (INDEPENDENT_AMBULATORY_CARE_PROVIDER_SITE_OTHER): Payer: Medicare Other | Admitting: Orthopedic Surgery

## 2017-10-28 ENCOUNTER — Encounter (INDEPENDENT_AMBULATORY_CARE_PROVIDER_SITE_OTHER): Payer: Self-pay | Admitting: Orthopedic Surgery

## 2017-10-28 DIAGNOSIS — M25552 Pain in left hip: Secondary | ICD-10-CM | POA: Diagnosis not present

## 2017-10-28 NOTE — Progress Notes (Signed)
Office Visit Note   Patient: George [name]           Date of Birth: [birth year]           MRN: [id number] Visit Date: 10/28/2017 Requested by: Merrilee Seashore, [address] [place], [place] [address] PCP: Merrilee Seashore, MD  Subjective: Chief Complaint  Patient presents with  . Left Hip - Pain    HPI: George [name] is a patient with left hip pain.  Had a fall about a month ago.  Radiographs done 10/15/2017 showed no discrete fracture in the periprosthetic region of the left hip.  He denies any radicular pain or numbness and tingling.  Has very focal pain behind the greater trochanteric region on the left-hand side.  He denies any groin pain.              ROS: All systems reviewed are negative as they relate to the chief complaint within the history of present illness.  Patient denies  fevers or chills.   Assessment & Plan: Visit Diagnoses:  1. Pain of left hip joint     Plan: Impression is bone bruise left hip without any evidence of fracture.  He may have partial muscle tear of some of his abductor's.  The hip itself is stable and has no symptoms referrable to the components of the hip replacement.  I think this is likely either a bone bruise or partial muscle tear.  Observation indicated.  No further work-up gated.  If his symptoms continue for another 6 weeks and we could consider repeat radiographs.  Follow-Up Instructions: Return if symptoms worsen or fail to improve.   Orders:  No orders of the defined types were placed in this encounter.  No orders of the defined types were placed in this encounter.     Procedures: No procedures performed   Clinical Data: No additional findings.  Objective: Vital Signs: There were no vitals taken for this visit.  Physical Exam:   Constitutional: Patient appears well-developed HEENT:  Head: Normocephalic Eyes:EOM are normal Neck: Normal range of motion Cardiovascular: Normal rate Pulmonary/chest:  Effort normal Neurologic: Patient is alert Skin: Skin is warm Psychiatric: Patient has normal mood and affect    Ortho Exam: Orthopedic exam demonstrates full active and passive range of motion of the left hip with very good abduction adduction and hip flexion strength.  No groin pain with internal or external rotation of the leg.  No nerve root tension signs.  Patient does not really have any tenderness over the trochanteric bursa but has tenderness in the posterior aspect of the greater trochanter.  Gait is normal.  Specialty Comments:  No specialty comments available.  Imaging: No results found.   PMFS History: Patient Active Problem List   Diagnosis Date Noted  . Primary osteoarthritis of both knees 06/17/2016  . Esophageal ring 12/14/2014  . Chronic atrial fibrillation ([facility]) 12/14/2014  . Chest pain 12/14/2014  . HLD (hyperlipidemia) 12/14/2014  . History of GI bleed 12/14/2014  . Peripheral neuropathy w/gait disturbance 12/14/2014  . Pain in the chest   . Paroxysmal atrial fibrillation ([facility])   . Gastroesophageal reflux disease without esophagitis    Past Medical History:  Diagnosis Date  . BPH (benign prostatic hypertrophy)   . Chronic atrial fibrillation (HCC)    CHA2DS2VASC score 3    . Esophageal ring   . GERD (gastroesophageal reflux disease)   . History of GI bleed   . Hypercholesteremia   . Osteoarthritis   .  Osteoporosis   . Peripheral neuropathy   . Peripheral neuropathy   . Prostate cancer ([facility])    treated with observation  . Trigger finger of both hands   . Vertigo   . Vitamin D deficiency     Family History  Problem Relation Age of Onset  . Cancer - Other Father     Past Surgical History:  Procedure Laterality Date  . BALLOON DILATION N/A 08/10/2013   Procedure: BALLOON DILATION;  Surgeon: Arta Silence, MD;  [facility];  Service: Endoscopy;  Laterality: N/A;  . BOTOX INJECTION N/A 08/10/2013   Procedure: BOTOX INJECTION;  Surgeon:  Arta Silence, MD;  [facility];  Service: Endoscopy;  Laterality: N/A;  + or -      balloon/botox  . CARDIAC CATHETERIZATION    . CHOLECYSTECTOMY  2010  . ESOPHAGOGASTRODUODENOSCOPY (EGD) WITH PROPOFOL N/A 08/10/2013   Procedure: ESOPHAGOGASTRODUODENOSCOPY (EGD) WITH PROPOFOL;  Surgeon: Arta Silence, MD;  [facility];  Service: Endoscopy;  Laterality: N/A;  . INGUINAL HERNIA REPAIR    . KNEE ARTHROCENTESIS    . skin cancer removed from face  1 week ago   area healing well, some areas reved in past also  . TOTAL HIP ARTHROPLASTY Bilateral yrs ago   hips   Social History   Occupational History  . Not on file  Tobacco Use  . Smoking status: Never Smoker  . Smokeless tobacco: Never Used  Substance and Sexual Activity  . Alcohol use: No  . Drug use: No  . Sexual activity: Not on file

## 2017-10-28 NOTE — Progress Notes (Unsigned)
Patient can have Synvisc One injections whenever he wants.  Insurance covers at 100%, buy and bill.   Do you know if patient was wanting to go ahead with these injections now?

## 2017-10-29 NOTE — Progress Notes (Signed)
IC s/w patient. He did wish to proceed with gel injections. I scheduled him an appt to have these done with Dr Marlou Sa.,

## 2017-11-02 ENCOUNTER — Telehealth (INDEPENDENT_AMBULATORY_CARE_PROVIDER_SITE_OTHER): Payer: Self-pay | Admitting: Orthopedic Surgery

## 2017-11-02 NOTE — Telephone Encounter (Signed)
Please advise. Patient reports continued pain. Plan at last office visit states  Plan: Impression is bone bruise left hip without any evidence of fracture.  He may have partial muscle tear of some of his abductor's.  The hip itself is stable and has no symptoms referrable to the components of the hip replacement.  I think this is likely either a bone bruise or partial muscle tear.  Observation indicated.  No further work-up gated.  If his symptoms continue for another 6 weeks and we could consider repeat radiographs.

## 2017-11-02 NOTE — Telephone Encounter (Signed)
Stay with plan for now thx

## 2017-11-02 NOTE — Telephone Encounter (Signed)
IC patient and advised. Patient coming on Friday for gel injections. Said he will discuss with Dr Marlou Sa at that time.

## 2017-11-02 NOTE — Telephone Encounter (Signed)
Pt called and left VM stating he was in a lot of pain. Called pt back and VM Box was full

## 2017-11-06 ENCOUNTER — Other Ambulatory Visit: Payer: Self-pay

## 2017-11-06 ENCOUNTER — Encounter (HOSPITAL_COMMUNITY): Payer: Self-pay | Admitting: Emergency Medicine

## 2017-11-06 ENCOUNTER — Emergency Department (HOSPITAL_COMMUNITY)
Admission: EM | Admit: 2017-11-06 | Discharge: 2017-11-06 | Disposition: A | Discharge status: Home / Self Care | Payer: Medicare Other | Attending: Emergency Medicine | Admitting: Emergency Medicine

## 2017-11-06 ENCOUNTER — Emergency Department (HOSPITAL_COMMUNITY): Payer: Medicare Other

## 2017-11-06 ENCOUNTER — Ambulatory Visit (INDEPENDENT_AMBULATORY_CARE_PROVIDER_SITE_OTHER): Payer: Medicare Other | Admitting: Orthopedic Surgery

## 2017-11-06 DIAGNOSIS — M25552 Pain in left hip: Secondary | ICD-10-CM | POA: Insufficient documentation

## 2017-11-06 DIAGNOSIS — Z7901 Long term (current) use of anticoagulants: Secondary | ICD-10-CM | POA: Insufficient documentation

## 2017-11-06 DIAGNOSIS — I480 Paroxysmal atrial fibrillation: Secondary | ICD-10-CM | POA: Insufficient documentation

## 2017-11-06 DIAGNOSIS — E7800 Pure hypercholesterolemia, unspecified: Secondary | ICD-10-CM | POA: Diagnosis not present

## 2017-11-06 DIAGNOSIS — M25562 Pain in left knee: Secondary | ICD-10-CM | POA: Insufficient documentation

## 2017-11-06 DIAGNOSIS — Z8546 Personal history of malignant neoplasm of prostate: Secondary | ICD-10-CM | POA: Insufficient documentation

## 2017-11-06 DIAGNOSIS — M25569 Pain in unspecified knee: Secondary | ICD-10-CM | POA: Diagnosis not present

## 2017-11-06 DIAGNOSIS — Z79899 Other long term (current) drug therapy: Secondary | ICD-10-CM | POA: Diagnosis not present

## 2017-11-06 DIAGNOSIS — R52 Pain, unspecified: Secondary | ICD-10-CM | POA: Diagnosis not present

## 2017-11-06 DIAGNOSIS — M1712 Unilateral primary osteoarthritis, left knee: Secondary | ICD-10-CM | POA: Diagnosis not present

## 2017-11-06 DIAGNOSIS — I959 Hypotension, unspecified: Secondary | ICD-10-CM | POA: Diagnosis not present

## 2017-11-06 DIAGNOSIS — S79912A Unspecified injury of left hip, initial encounter: Secondary | ICD-10-CM | POA: Diagnosis not present

## 2017-11-06 MED ORDER — LORAZEPAM 2 MG/ML IJ SOLN
0.5000 mg | Freq: Once | INTRAMUSCULAR | Status: DC
Start: 2017-11-06 — End: 2017-11-06

## 2017-11-06 MED ORDER — GI COCKTAIL ~~LOC~~
30.0000 mL | Freq: Once | ORAL | Status: AC
  Administered 2017-11-06: 30 mL via ORAL
  Filled 2017-11-06: qty 30

## 2017-11-06 MED ORDER — LORAZEPAM 1 MG PO TABS
1.0000 mg | ORAL_TABLET | Freq: Once | ORAL | Status: AC
  Administered 2017-11-06: 1 mg via ORAL
  Filled 2017-11-06 (×2): qty 1

## 2017-11-06 MED ORDER — DIAZEPAM 2 MG PO TABS: 2.0000 mg | ORAL_TABLET | Freq: Three times a day (TID) | ORAL | 0 refills | Status: DC | PRN

## 2017-11-06 MED ORDER — HYDROCODONE-ACETAMINOPHEN 5-325 MG PO TABS: 1.0000 | ORAL_TABLET | ORAL | 0 refills | Status: DC | PRN

## 2017-11-06 MED ORDER — MORPHINE SULFATE (PF) 4 MG/ML IV SOLN
4.0000 mg | Freq: Once | INTRAVENOUS | Status: AC
  Administered 2017-11-06: 4 mg via INTRAMUSCULAR
  Filled 2017-11-06: qty 1

## 2017-11-06 MED ORDER — ONDANSETRON 4 MG PO TBDP
4.0000 mg | ORAL_TABLET | Freq: Once | ORAL | Status: AC
  Administered 2017-11-06: 4 mg via ORAL
  Filled 2017-11-06: qty 1

## 2017-11-06 NOTE — Telephone Encounter (Signed)
fyi

## 2017-11-06 NOTE — Telephone Encounter (Signed)
Pt called in a lot of pain this morning unable to drive to appt. Pt was instructed to call EMS due to extreme pain levels.

## 2017-11-06 NOTE — ED Notes (Signed)
Pt ambulated with cane without difficulty, slight pain but pt was steady on his feet.

## 2017-11-06 NOTE — Discharge Instructions (Signed)
You may need PT to work out some of the stiffness in your hip.  Contact Dr. Marlou Sa.

## 2017-11-06 NOTE — ED Provider Notes (Addendum)
[facility] EMERGENCY DEPARTMENT Provider Note   CSN: [id number] Arrival date & time: 11/06/17  1000     History   Chief Complaint Chief Complaint  Patient presents with  . Hip Pain    HPI George [name] is a 84 y.o. male.  Pt presents to the ED today with left hip pain s/p fall.  Pt fell on June 13, went to Dr. Randel Pigg (ortho) office, had xrays that were negative.  The pt said his pain is not getting any better, but getting worse.  The pain is so bad that he's unable to drive.  The pt had an appt with Dr. Marlou Sa today, but he came here instead b/c he could not drive.  Pt does have a hx of a.fib and is on Eliquis.     Past Medical History:  Diagnosis Date  . BPH (benign prostatic hypertrophy)   . Chronic atrial fibrillation (HCC)    CHA2DS2VASC score 3    . Esophageal ring   . GERD (gastroesophageal reflux disease)   . History of GI bleed   . Hypercholesteremia   . Osteoarthritis   . Osteoporosis   . Peripheral neuropathy   . Peripheral neuropathy   . Prostate cancer ([facility])    treated with observation  . Trigger finger of both hands   . Vertigo   . Vitamin D deficiency     Patient Active Problem List   Diagnosis Date Noted  . Primary osteoarthritis of both knees 06/17/2016  . Esophageal ring 12/14/2014  . Chronic atrial fibrillation ([facility]) 12/14/2014  . Chest pain 12/14/2014  . HLD (hyperlipidemia) 12/14/2014  . History of GI bleed 12/14/2014  . Peripheral neuropathy w/gait disturbance 12/14/2014  . Pain in the chest   . Paroxysmal atrial fibrillation ([facility])   . Gastroesophageal reflux disease without esophagitis     Past Surgical History:  Procedure Laterality Date  . BALLOON DILATION N/A 08/10/2013   Procedure: BALLOON DILATION;  Surgeon: Arta Silence, MD;  [facility];  Service: Endoscopy;  Laterality: N/A;  . BOTOX INJECTION N/A 08/10/2013   Procedure: BOTOX INJECTION;  Surgeon: Arta Silence, MD;  [facility];   Service: Endoscopy;  Laterality: N/A;  + or -      balloon/botox  . CARDIAC CATHETERIZATION    . CHOLECYSTECTOMY  2010  . ESOPHAGOGASTRODUODENOSCOPY (EGD) WITH PROPOFOL N/A 08/10/2013   Procedure: ESOPHAGOGASTRODUODENOSCOPY (EGD) WITH PROPOFOL;  Surgeon: Arta Silence, MD;  [facility];  Service: Endoscopy;  Laterality: N/A;  . INGUINAL HERNIA REPAIR    . KNEE ARTHROCENTESIS    . skin cancer removed from face  1 week ago   area healing well, some areas reved in past also  . TOTAL HIP ARTHROPLASTY Bilateral yrs ago   hips        Home Medications    Prior to Admission medications   Medication Sig Start Date End Date Taking? Authorizing Provider  apixaban (ELIQUIS) 5 MG TABS tablet Take 1 tablet (5 mg total) by mouth 2 (two) times daily. 08/12/13   Arta Silence, MD  diazepam (VALIUM) 2 MG tablet Take 1 tablet (2 mg total) by mouth every 8 (eight) hours as needed for muscle spasms. 11/06/17   Isla Pence, MD  HYDROcodone-acetaminophen (NORCO/VICODIN) 5-325 MG tablet Take 1 tablet by mouth every 4 (four) hours as needed. 11/06/17   Isla Pence, MD  nitroGLYCERIN (NITROSTAT) 0.4 MG SL tablet Place 1 tablet (0.4 mg total) under the tongue every 5 (five) minutes  as needed for chest pain. 12/15/14   Rai, Ripudeep K, MD  pantoprazole (PROTONIX) 40 MG tablet Take 1 tablet (40 mg total) by mouth daily. 12/15/14   Rai, Ripudeep K, MD  rosuvastatin (CRESTOR) 10 MG tablet Take 10 mg by mouth every other day.     [provider]    Family History Family History  Problem Relation Age of Onset  . Cancer - Other Father     Social History Social History   Tobacco Use  . Smoking status: Never Smoker  . Smokeless tobacco: Never Used  Substance Use Topics  . Alcohol use: No  . Drug use: No     Allergies   Aspirin   Review of Systems Review of Systems  Musculoskeletal:       Left hip pain Left knee pain  All other systems reviewed and are  negative.    Physical Exam Updated Vital Signs BP (!) 153/80   Pulse 80   Temp (!) 97.3 F (36.3 C) (Oral)   Resp 16   Ht 5\' 10"  (1.778 m)   Wt 79.4 kg (175 lb)   SpO2 98%   BMI 25.11 kg/m   Physical Exam  Constitutional: He is oriented to person, place, and time. He appears well-developed and well-nourished.  HENT:  Head: Normocephalic and atraumatic.  Right Ear: External ear normal.  Left Ear: External ear normal.  Nose: Nose normal.  Mouth/Throat: Oropharynx is clear and moist.  Eyes: Pupils are equal, round, and reactive to light. Conjunctivae and EOM are normal.  Neck: Normal range of motion. Neck supple.  Cardiovascular: Normal rate, normal heart sounds and intact distal pulses. An irregularly irregular rhythm present.  Pulmonary/Chest: Effort normal and breath sounds normal.  Abdominal: Soft. Bowel sounds are normal.  Musculoskeletal:       Left hip: He exhibits tenderness.  Neurological: He is alert and oriented to person, place, and time.  Skin: Skin is warm. Capillary refill takes less than 2 seconds.  Psychiatric: He has a normal mood and affect. His behavior is normal. Judgment and thought content normal.  Nursing note and vitals reviewed.    ED Treatments / Results  Labs (all labs ordered are listed, but only abnormal results are displayed) Labs Reviewed - No data to display  EKG None  Radiology No results found.  Procedures Procedures (including critical care time)  Medications Ordered in ED Medications  ondansetron (ZOFRAN-ODT) disintegrating tablet 4 mg (4 mg Oral Given 11/06/17 1146)  morphine 4 MG/ML injection 4 mg (4 mg Intramuscular Given 11/06/17 1146)  gi cocktail (Maalox,Lidocaine,Donnatal) (30 mLs Oral Given 11/06/17 1306)  LORazepam (ATIVAN) tablet 1 mg (1 mg Oral Given 11/06/17 1338)     Initial Impression / Assessment and Plan / ED Course  I have reviewed the triage vital signs and the nursing notes.  Pertinent labs & imaging results  that were available during my care of the patient were reviewed by me and considered in my medical decision making (see chart for details).    Pt is feeling much better.  He is able to ambulate without terrible pain like he had when he came in.  He may need PT to mobilize his hip and knee a little more.  F/u with Dr. Marlou Sa.  Return if worse.  Final Clinical Impressions(s) / ED Diagnoses   Final diagnoses:  Left hip pain  Acute pain of left knee    ED Discharge Orders        Ordered  HYDROcodone-acetaminophen (NORCO/VICODIN) 5-325 MG tablet  Every 4 hours PRN     11/06/17 1356    diazepam (VALIUM) 2 MG tablet  Every 8 hours PRN     11/06/17 1356       Isla Pence, MD 11/06/17 1357    Isla Pence, MD 11/17/17 1929

## 2017-11-06 NOTE — ED Triage Notes (Signed)
Pt to ED via [facility] from home- with c/o pain in left hip - fell approx 10 days ago- was seen by private orthopedist at time of fall-- had xrays, that were negative. Also c/o pain in knees- was unable to drive self to dr's office and was told to come to ED-for further tests.  Pt is alert/oriented x 4 - lives in own home, takes care of himself.

## 2017-11-06 NOTE — Telephone Encounter (Signed)
ok 

## 2017-11-06 NOTE — ED Notes (Signed)
Ambulated PT in hall, PT had steady gait.  Only mentioned slight pain.

## 2017-11-18 ENCOUNTER — Ambulatory Visit (INDEPENDENT_AMBULATORY_CARE_PROVIDER_SITE_OTHER): Payer: Medicare Other | Admitting: Orthopedic Surgery

## 2017-11-18 ENCOUNTER — Encounter (INDEPENDENT_AMBULATORY_CARE_PROVIDER_SITE_OTHER): Payer: Self-pay | Admitting: Orthopedic Surgery

## 2017-11-18 DIAGNOSIS — M1712 Unilateral primary osteoarthritis, left knee: Secondary | ICD-10-CM

## 2017-11-18 DIAGNOSIS — M1711 Unilateral primary osteoarthritis, right knee: Secondary | ICD-10-CM

## 2017-11-22 ENCOUNTER — Encounter (INDEPENDENT_AMBULATORY_CARE_PROVIDER_SITE_OTHER): Payer: Self-pay | Admitting: Orthopedic Surgery

## 2017-11-22 DIAGNOSIS — M1712 Unilateral primary osteoarthritis, left knee: Secondary | ICD-10-CM | POA: Diagnosis not present

## 2017-11-22 DIAGNOSIS — M1711 Unilateral primary osteoarthritis, right knee: Secondary | ICD-10-CM

## 2017-11-22 MED ORDER — HYLAN G-F 20 48 MG/6ML IX SOSY
48.0000 mg | PREFILLED_SYRINGE | INTRA_ARTICULAR | Status: AC | PRN
  Administered 2017-11-22: 48 mg via INTRA_ARTICULAR

## 2017-11-22 MED ORDER — LIDOCAINE HCL 1 % IJ SOLN
5.0000 mL | INTRAMUSCULAR | Status: AC | PRN
  Administered 2017-11-22: 5 mL

## 2017-11-22 NOTE — Progress Notes (Signed)
   Procedure Note  Patient: George [name]             Date of Birth: 09-26-1923           MRN: [id number]             Visit Date: 11/18/2017  Procedures: Visit Diagnoses: Unilateral primary osteoarthritis, left knee  Unilateral primary osteoarthritis, right knee  Large Joint Inj: bilateral knee on 11/22/2017 10:20 AM Indications: pain, joint swelling and diagnostic evaluation Details: 18 G 1.5 in needle, superolateral approach  Arthrogram: No  Medications (Right): 5 mL lidocaine 1 %; 48 mg Hylan 48 MG/6ML Medications (Left): 5 mL lidocaine 1 %; 48 mg Hylan 48 MG/6ML Outcome: tolerated well, no immediate complications Procedure, treatment alternatives, risks and benefits explained, specific risks discussed. Consent was given by the patient. Immediately prior to procedure a time out was called to verify the correct patient, procedure, equipment, support staff and site/side marked as required. Patient was prepped and draped in the usual sterile fashion.

## 2017-11-27 DIAGNOSIS — C61 Malignant neoplasm of prostate: Secondary | ICD-10-CM | POA: Diagnosis not present

## 2017-12-01 DIAGNOSIS — R3912 Poor urinary stream: Secondary | ICD-10-CM | POA: Diagnosis not present

## 2017-12-01 DIAGNOSIS — N403 Nodular prostate with lower urinary tract symptoms: Secondary | ICD-10-CM | POA: Diagnosis not present

## 2017-12-01 DIAGNOSIS — Z8546 Personal history of malignant neoplasm of prostate: Secondary | ICD-10-CM | POA: Diagnosis not present

## 2018-03-17 DIAGNOSIS — E782 Mixed hyperlipidemia: Secondary | ICD-10-CM | POA: Diagnosis not present

## 2018-03-17 DIAGNOSIS — R5383 Other fatigue: Secondary | ICD-10-CM | POA: Diagnosis not present

## 2018-03-24 DIAGNOSIS — M810 Age-related osteoporosis without current pathological fracture: Secondary | ICD-10-CM | POA: Diagnosis not present

## 2018-03-24 DIAGNOSIS — E782 Mixed hyperlipidemia: Secondary | ICD-10-CM | POA: Diagnosis not present

## 2018-03-24 DIAGNOSIS — I4821 Permanent atrial fibrillation: Secondary | ICD-10-CM | POA: Diagnosis not present

## 2018-03-24 DIAGNOSIS — Z0000 Encounter for general adult medical examination without abnormal findings: Secondary | ICD-10-CM | POA: Diagnosis not present

## 2018-03-24 DIAGNOSIS — G609 Hereditary and idiopathic neuropathy, unspecified: Secondary | ICD-10-CM | POA: Diagnosis not present

## 2018-03-24 DIAGNOSIS — N401 Enlarged prostate with lower urinary tract symptoms: Secondary | ICD-10-CM | POA: Diagnosis not present

## 2018-03-24 DIAGNOSIS — M1A0790 Idiopathic chronic gout, unspecified ankle and foot, without tophus (tophi): Secondary | ICD-10-CM | POA: Diagnosis not present

## 2018-03-24 DIAGNOSIS — M150 Primary generalized (osteo)arthritis: Secondary | ICD-10-CM | POA: Diagnosis not present

## 2018-06-10 ENCOUNTER — Encounter: Payer: Medicare Other | Admitting: Podiatry

## 2018-06-10 ENCOUNTER — Ambulatory Visit: Payer: Medicare Other

## 2018-06-10 NOTE — Progress Notes (Signed)
This encounter was created in error - please disregard.

## 2018-06-18 ENCOUNTER — Ambulatory Visit (INDEPENDENT_AMBULATORY_CARE_PROVIDER_SITE_OTHER): Payer: Medicare Other | Admitting: Podiatry

## 2018-06-18 VITALS — BP 154/86 | HR 84

## 2018-06-18 DIAGNOSIS — B351 Tinea unguium: Secondary | ICD-10-CM | POA: Diagnosis not present

## 2018-06-18 DIAGNOSIS — M792 Neuralgia and neuritis, unspecified: Secondary | ICD-10-CM | POA: Diagnosis not present

## 2018-06-18 DIAGNOSIS — M79609 Pain in unspecified limb: Secondary | ICD-10-CM

## 2018-06-18 DIAGNOSIS — D689 Coagulation defect, unspecified: Secondary | ICD-10-CM | POA: Diagnosis not present

## 2018-07-03 NOTE — Progress Notes (Signed)
  Subjective:  Patient ID: George [name], male    DOB: 08-21-1923,  MRN: [id number]  Chief Complaint  Patient presents with  . Nail Problem    Trim bilateral 1-5  . Nail Problem    Bilateral 1-5 pt states discomfort in nails  . Peripheral Neuropathy    Pt states cramping in bilateral feet plantar/dorsal and loss of sensation    85 y.o. male presents with the above complaint.  Reports painfully elongated nails to both feet.  Review of Systems: Negative except as noted in the HPI. Denies N/V/F/Ch.  Past Medical History:  Diagnosis Date  . BPH (benign prostatic hypertrophy)   . Chronic atrial fibrillation (HCC)    CHA2DS2VASC score 3    . Esophageal ring   . GERD (gastroesophageal reflux disease)   . History of GI bleed   . Hypercholesteremia   . Osteoarthritis   . Osteoporosis   . Peripheral neuropathy   . Peripheral neuropathy   . Prostate cancer ([place])    treated with observation  . Trigger finger of both hands   . Vertigo   . Vitamin D deficiency     Current Outpatient Medications:  .  apixaban (ELIQUIS) 5 MG TABS tablet, Take 1 tablet (5 mg total) by mouth 2 (two) times daily., Disp: 60 tablet, Rfl:  .  COLCRYS 0.6 MG tablet, , Disp: , Rfl:  .  diazepam (VALIUM) 2 MG tablet, Take 1 tablet (2 mg total) by mouth every 8 (eight) hours as needed for muscle spasms., Disp: 10 tablet, Rfl: 0 .  HYDROcodone-acetaminophen (NORCO/VICODIN) 5-325 MG tablet, Take 1 tablet by mouth every 4 (four) hours as needed., Disp: 10 tablet, Rfl: 0 .  nitroGLYCERIN (NITROSTAT) 0.4 MG SL tablet, Place 1 tablet (0.4 mg total) under the tongue every 5 (five) minutes as needed for chest pain., Disp: 30 tablet, Rfl: 12 .  pantoprazole (PROTONIX) 40 MG tablet, Take 1 tablet (40 mg total) by mouth daily., Disp: 30 tablet, Rfl: 3 .  rosuvastatin (CRESTOR) 10 MG tablet, Take 10 mg by mouth every other day. , Disp: , Rfl:   Social History   Tobacco Use  Smoking Status Never Smoker  Smokeless  Tobacco Never Used    Allergies  Allergen Reactions  . Aspirin     bleeding   Objective:   Vitals:   06/18/18 1123  BP: (!) 154/86  Pulse: 84   There is no height or weight on file to calculate BMI. Constitutional Well developed. Well nourished.  Vascular Dorsalis pedis pulses palpable bilaterally. Posterior tibial pulses palpable bilaterally. Capillary refill normal to all digits.  No cyanosis or clubbing noted. Pedal hair growth normal.  Neurologic Normal speech. Oriented to person, place, and time. Epicritic sensation to light touch grossly present bilaterally.  Dermatologic Nails elongated dystrophic pain to palpation No open wounds. No skin lesions.  Orthopedic: Normal joint ROM without pain or crepitus bilaterally. No visible deformities. No bony tenderness.   Radiographs: None Assessment:   1. Pain due to onychomycosis of nail   2. Coagulation defect ([place])   3. Peripheral neuralgia    Plan:  Patient was evaluated and treated and all questions answered.  Onychomycosis with coag defect -Nails palliatively debridement as below -Educated on self-care  Procedure: Nail Debridement Rationale: Pain Type of Debridement: manual, sharp debridement. Instrumentation: Nail nipper, rotary burr. Number of Nails: 10    Return in about 3 months (around 09/16/2018) for RFC  - on AC.

## 2018-07-26 ENCOUNTER — Telehealth: Payer: Self-pay

## 2018-07-26 NOTE — Telephone Encounter (Signed)
   Cardiac Questionnaire:    Since your last visit or hospitalization:    1. Have you been having new or worsening chest pain? No   2. Have you been having new or worsening shortness of breath? No 3. Have you been having new or worsening leg swelling, wt gain, or increase in abdominal girth (pants fitting more tightly)? No   4. Have you had any passing out spells? No     Primary Cardiologist:  Buford Dresser   Patient contacted.  History reviewed.  No symptoms to suggest any unstable cardiac conditions.  Based on discussion, with current pandemic situation, we will be postponing this appointment for [name].  If symptoms change, he has been instructed to contact our office.     Meryl Crutch, RN  07/26/2018 10:50 AM         .

## 2018-07-27 ENCOUNTER — Ambulatory Visit: Payer: Medicare Other | Admitting: Cardiology

## 2018-08-03 ENCOUNTER — Telehealth (INDEPENDENT_AMBULATORY_CARE_PROVIDER_SITE_OTHER): Payer: Self-pay

## 2018-08-03 NOTE — Telephone Encounter (Signed)
Screening questions answered  "no" to all answers

## 2018-08-04 ENCOUNTER — Ambulatory Visit (INDEPENDENT_AMBULATORY_CARE_PROVIDER_SITE_OTHER): Payer: Medicare Other | Admitting: Orthopedic Surgery

## 2018-08-04 ENCOUNTER — Other Ambulatory Visit: Payer: Self-pay

## 2018-08-04 ENCOUNTER — Encounter (INDEPENDENT_AMBULATORY_CARE_PROVIDER_SITE_OTHER): Payer: Self-pay | Admitting: Orthopedic Surgery

## 2018-08-04 DIAGNOSIS — M1712 Unilateral primary osteoarthritis, left knee: Secondary | ICD-10-CM | POA: Diagnosis not present

## 2018-08-04 DIAGNOSIS — M1711 Unilateral primary osteoarthritis, right knee: Secondary | ICD-10-CM

## 2018-08-04 MED ORDER — LIDOCAINE HCL 1 % IJ SOLN
5.0000 mL | INTRAMUSCULAR | Status: AC | PRN
  Administered 2018-08-04: 5 mL

## 2018-08-04 MED ORDER — BUPIVACAINE HCL 0.25 % IJ SOLN
4.0000 mL | INTRAMUSCULAR | Status: AC | PRN
  Administered 2018-08-04: 4 mL via INTRA_ARTICULAR

## 2018-08-04 MED ORDER — METHYLPREDNISOLONE ACETATE 40 MG/ML IJ SUSP
40.0000 mg | INTRAMUSCULAR | Status: AC | PRN
  Administered 2018-08-04: 40 mg via INTRA_ARTICULAR

## 2018-08-04 NOTE — Progress Notes (Signed)
Office Visit Note   Patient: George [name]           Date of Birth: 1924-01-13           MRN: [id number] Visit Date: 08/04/2018 Requested by: Merrilee Seashore, [address] [place], [place] [address] PCP: Merrilee Seashore, MD  Subjective: Chief Complaint  Patient presents with  . Right Knee - Pain  . Left Knee - Pain    HPI: George [name] is a 85 year old patient with bilateral knee pain.  Has known bilateral knee arthritis.  Last injections gel injection in July 2019.  No interval history of injury.  He does use a cane.  He is requesting a walker.              ROS: All systems reviewed are negative as they relate to the chief complaint within the history of present illness.  Patient denies  fevers or chills.   Assessment & Plan: Visit Diagnoses:  1. Unilateral primary osteoarthritis, left knee   2. Unilateral primary osteoarthritis, right knee     Plan: Impression is bilateral knee pain and swelling.  Plan is bilateral knee aspiration injection today.  Follow-up with me as needed.  I am going to write him a prescription for a walker as well.  We will preapproved him for gel injections also so that when these cortisone shots wear off we can redo the gel if needed.  Follow-Up Instructions: Return if symptoms worsen or fail to improve.   Orders:  No orders of the defined types were placed in this encounter.  No orders of the defined types were placed in this encounter.     Procedures: Large Joint Inj: bilateral knee on 08/04/2018 10:38 AM Indications: diagnostic evaluation, joint swelling and pain Details: 18 G 1.5 in needle, superolateral approach  Arthrogram: No  Medications (Right): 5 mL lidocaine 1 %; 40 mg methylPREDNISolone acetate 40 MG/ML; 4 mL bupivacaine 0.25 % Medications (Left): 5 mL lidocaine 1 %; 40 mg methylPREDNISolone acetate 40 MG/ML; 4 mL bupivacaine 0.25 % Outcome: tolerated well, no immediate complications Procedure, treatment  alternatives, risks and benefits explained, specific risks discussed. Consent was given by the patient. Immediately prior to procedure a time out was called to verify the correct patient, procedure, equipment, support staff and site/side marked as required. Patient was prepped and draped in the usual sterile fashion.       Clinical Data: No additional findings.  Objective: Vital Signs: There were no vitals taken for this visit.  Physical Exam:   Constitutional: Patient appears well-developed HEENT:  Head: Normocephalic Eyes:EOM are normal Neck: Normal range of motion Cardiovascular: Normal rate Pulmonary/chest: Effort normal Neurologic: Patient is alert Skin: Skin is warm Psychiatric: Patient has normal mood and affect    Ortho Exam: Ortho exam demonstrates slight valgus alignment bilateral knees with intact extensor mechanism.  Mild effusion present in both knees.  Extensor mechanism is intact and range of motion is flexion past 90.  Specialty Comments:  No specialty comments available.  Imaging: No results found.   PMFS History: Patient Active Problem List   Diagnosis Date Noted  . Primary osteoarthritis of both knees 06/17/2016  . Esophageal ring 12/14/2014  . Chronic atrial fibrillation 12/14/2014  . Chest pain 12/14/2014  . HLD (hyperlipidemia) 12/14/2014  . History of GI bleed 12/14/2014  . Peripheral neuropathy w/gait disturbance 12/14/2014  . Pain in the chest   . Paroxysmal atrial fibrillation ([place])   . Gastroesophageal reflux disease without  esophagitis    Past Medical History:  Diagnosis Date  . BPH (benign prostatic hypertrophy)   . Chronic atrial fibrillation    CHA2DS2VASC score 3    . Esophageal ring   . GERD (gastroesophageal reflux disease)   . History of GI bleed   . Hypercholesteremia   . Osteoarthritis   . Osteoporosis   . Peripheral neuropathy   . Peripheral neuropathy   . Prostate cancer ([place])    treated with observation  .  Trigger finger of both hands   . Vertigo   . Vitamin D deficiency     Family History  Problem Relation Age of Onset  . Cancer - Other Father     Past Surgical History:  Procedure Laterality Date  . BALLOON DILATION N/A 08/10/2013   Procedure: BALLOON DILATION;  Surgeon: Arta Silence, MD;  Location: [facility];  Service: Endoscopy;  Laterality: N/A;  . BOTOX INJECTION N/A 08/10/2013   Procedure: BOTOX INJECTION;  Surgeon: Arta Silence, MD;  Location: [facility];  Service: Endoscopy;  Laterality: N/A;  + or -      balloon/botox  . CARDIAC CATHETERIZATION    . CHOLECYSTECTOMY  2010  . ESOPHAGOGASTRODUODENOSCOPY (EGD) WITH PROPOFOL N/A 08/10/2013   Procedure: ESOPHAGOGASTRODUODENOSCOPY (EGD) WITH PROPOFOL;  Surgeon: Arta Silence, MD;  Location: [facility];  Service: Endoscopy;  Laterality: N/A;  . INGUINAL HERNIA REPAIR    . KNEE ARTHROCENTESIS    . skin cancer removed from face  1 week ago   area healing well, some areas reved in past also  . TOTAL HIP ARTHROPLASTY Bilateral yrs ago   hips   Social History   Occupational History  . Not on file  Tobacco Use  . Smoking status: Never Smoker  . Smokeless tobacco: Never Used  Substance and Sexual Activity  . Alcohol use: No  . Drug use: No  . Sexual activity: Not on file

## 2018-09-17 ENCOUNTER — Encounter: Payer: Self-pay | Admitting: Podiatry

## 2018-09-17 ENCOUNTER — Ambulatory Visit (INDEPENDENT_AMBULATORY_CARE_PROVIDER_SITE_OTHER): Payer: Medicare Other | Admitting: Podiatry

## 2018-09-17 ENCOUNTER — Other Ambulatory Visit: Payer: Self-pay

## 2018-09-17 VITALS — Temp 98.2°F

## 2018-09-17 DIAGNOSIS — L03032 Cellulitis of left toe: Secondary | ICD-10-CM | POA: Diagnosis not present

## 2018-09-17 DIAGNOSIS — B351 Tinea unguium: Secondary | ICD-10-CM | POA: Diagnosis not present

## 2018-09-17 DIAGNOSIS — M79676 Pain in unspecified toe(s): Secondary | ICD-10-CM | POA: Diagnosis not present

## 2018-09-17 NOTE — Patient Instructions (Addendum)
  PLEASE APPLY POLYSPORIN ONCE A DAY FOR ABOUT A WEEK Onychomycosis/Fungal Toenails  WHAT IS IT? An infection that lies within the keratin of your nail plate that is caused by a fungus.  WHY ME? Fungal infections affect all ages, sexes, races, and creeds.  There may be many factors that predispose you to a fungal infection such as age, coexisting medical conditions such as diabetes, or an autoimmune disease; stress, medications, fatigue, genetics, etc.  Bottom line: fungus thrives in a warm, moist environment and your shoes offer such a location.  IS IT CONTAGIOUS? Theoretically, yes.  You do not want to share shoes, nail clippers or files with someone who has fungal toenails.  Walking around barefoot in the same room or sleeping in the same bed is unlikely to transfer the organism.  It is important to realize, however, that fungus can spread easily from one nail to the next on the same foot.  HOW DO WE TREAT THIS?  There are several ways to treat this condition.  Treatment may depend on many factors such as age, medications, pregnancy, liver and kidney conditions, etc.  It is best to ask your doctor which options are available to you.  1. No treatment.   Unlike many other medical concerns, you can live with this condition.  However for many people this can be a painful condition and may lead to ingrown toenails or a bacterial infection.  It is recommended that you keep the nails cut short to help reduce the amount of fungal nail. 2. Topical treatment.  These range from herbal remedies to prescription strength nail lacquers.  About 40-50% effective, topicals require twice daily application for approximately 9 to 12 months or until an entirely new nail has grown out.  The most effective topicals are medical grade medications available through physicians offices. 3. Oral antifungal medications.  With an 80-90% cure rate, the most common oral medication requires 3 to 4 months of therapy and stays in your  system for a year as the new nail grows out.  Oral antifungal medications do require blood work to make sure it is a safe drug for you.  A liver function panel will be performed prior to starting the medication and after the first month of treatment.  It is important to have the blood work performed to avoid any harmful side effects.  In general, this medication safe but blood work is required. 4. Laser Therapy.  This treatment is performed by applying a specialized laser to the affected nail plate.  This therapy is noninvasive, fast, and non-painful.  It is not covered by insurance and is therefore, out of pocket.  The results have been very good with a 80-95% cure rate.  [facility] is the only practice in the area to offer this therapy. 5. Permanent Nail Avulsion.  Removing the entire nail so that a new nail will not grow back.

## 2018-09-22 DIAGNOSIS — E782 Mixed hyperlipidemia: Secondary | ICD-10-CM | POA: Diagnosis not present

## 2018-09-22 DIAGNOSIS — I4821 Permanent atrial fibrillation: Secondary | ICD-10-CM | POA: Diagnosis not present

## 2018-09-22 DIAGNOSIS — G609 Hereditary and idiopathic neuropathy, unspecified: Secondary | ICD-10-CM | POA: Diagnosis not present

## 2018-09-23 ENCOUNTER — Other Ambulatory Visit: Payer: Self-pay | Admitting: Podiatry

## 2018-09-23 ENCOUNTER — Telehealth: Payer: Self-pay | Admitting: *Deleted

## 2018-09-23 DIAGNOSIS — B353 Tinea pedis: Secondary | ICD-10-CM

## 2018-09-23 NOTE — Telephone Encounter (Signed)
Pt states he was seen in the [facility] office 09/17/2018 and the medication has not been called to the pharmacy.

## 2018-09-24 MED ORDER — NONFORMULARY OR COMPOUNDED ITEM: 5 refills | Status: DC

## 2018-09-24 NOTE — Telephone Encounter (Signed)
I informed pt the orders would come from [place]. Faxed orders to [facility].

## 2018-09-24 NOTE — Telephone Encounter (Signed)
-----   Message from Marzetta Board, DPM sent at 09/23/2018  4:39 PM EDT ----- Regarding: Prescription Just confirming prescription was for athlete's feet. If so, can you call in Ciclopirox 0.77% cream to be applied bid for 30 days.  Thanks Val!

## 2018-09-24 NOTE — Telephone Encounter (Signed)
I spoke with pt he states the medication was to come from [place]. Dr. Elisha Ponder states order [facility] Antifungal cream. Orders faxed to [facility].

## 2018-09-27 ENCOUNTER — Encounter: Payer: Self-pay | Admitting: Podiatry

## 2018-09-27 NOTE — Progress Notes (Signed)
Subjective: TATEN MERROW presents today with painful, thick toenails 1-5 b/l that he cannot cut and which interfere with daily activities.  Pain is aggravated when wearing enclosed shoe gear.  Mr. Schmale is requesting medication for his mycotic toenails.  Merrilee Seashore, MD is his PCP and last visit was several months ago.  He is also on Eliquis.   Current Outpatient Medications:  .  apixaban (ELIQUIS) 5 MG TABS tablet, Take 1 tablet (5 mg total) by mouth 2 (two) times daily., Disp: 60 tablet, Rfl:  .  COLCRYS 0.6 MG tablet, , Disp: , Rfl:  .  diazepam (VALIUM) 2 MG tablet, Take 1 tablet (2 mg total) by mouth every 8 (eight) hours as needed for muscle spasms., Disp: 10 tablet, Rfl: 0 .  HYDROcodone-acetaminophen (NORCO/VICODIN) 5-325 MG tablet, Take 1 tablet by mouth every 4 (four) hours as needed., Disp: 10 tablet, Rfl: 0 .  nitroGLYCERIN (NITROSTAT) 0.4 MG SL tablet, Place 1 tablet (0.4 mg total) under the tongue every 5 (five) minutes as needed for chest pain., Disp: 30 tablet, Rfl: 12 .  pantoprazole (PROTONIX) 40 MG tablet, Take 1 tablet (40 mg total) by mouth daily., Disp: 30 tablet, Rfl: 3 .  rosuvastatin (CRESTOR) 10 MG tablet, Take 10 mg by mouth every other day. , Disp: , Rfl:  .  NONFORMULARY OR COMPOUNDED ITEM, [facility]:  Antifungal Cream - Terbinafine 3%, Fluconazole 2%, Tea Tree Oil 5%, Urea 10%, Ibuprofen 2%, in DMSO #2ml suspension, apply to affected toenail(s) once at bedtime., Disp: 30 each, Rfl: 5  Allergies  Allergen Reactions  . Aspirin     bleeding    Objective:  Vascular Examination: Capillary refill time immediate x 10 digits  Dorsalis pedis and Posterior tibial pulses palpable b/l  Digital hair present x 10 digits  Skin temperature gradient WNL b/l  Dermatological Examination: Skin with normal turgor, texture and tone b/l  Toenails 1-5 right, 1, 2, 4, 5 are discolored, thick, dystrophic with subungual debris and pain with  palpation to nailbeds due to thickness of nails.  Evidence of subungual seroma left hallux with serous drainage expressed subungually.   Left 3rd digit with evidence of subacute fungal paronychia noted proximal nailfold with irritation of nail border. No edema, no purulence.   Musculoskeletal: Muscle strength 5/5 to all LE muscle groups  No gross bony deformities b/l.  No pain, crepitus or joint limitation noted with ROM.   Neurological: Sensation intact with 10 gram monofilament.  Vibratory sensation intact.  Assessment: Painful onychomycosis toenails 1-5 right, 1, 2, 4, 5 left  Plan: 1. Toenails 1-5 right, 1, 2, 4, 5 were debrided in length and girth without iatrogenic bleeding. Rx to be sent for nonformulary compounding topical antifungal: [facility]: Antifungal cream - Terbinafine 3%, Fluconazole 2%, Tea Tree Oil 5%, Urea 10%, Ibuprofen 2% in DMSO Suspension #49ml. Apply to the affected nail(s) at bedtime. 2. Subungual seroma evacuated and light bleeding addressed with Lumicain. Antibiotic ointment and bandaid applied. Patient instructed to apply Polysporin ointment to digit once daily for one week. 3. Offending nail border fungal paronychia curretaged. Border cleansed with alcohol and he is to apply Polysporin Ointment to left 3rd digit once daily for one week. 4. Patient to continue soft, supportive shoe gear daily. 5. Patient to report any pedal injuries to medical professional immediately. 6. Follow up 3 months.  7. Patient/POA to call should there be a concern in the interim.

## 2018-09-29 DIAGNOSIS — Z23 Encounter for immunization: Secondary | ICD-10-CM | POA: Diagnosis not present

## 2018-09-29 DIAGNOSIS — G609 Hereditary and idiopathic neuropathy, unspecified: Secondary | ICD-10-CM | POA: Diagnosis not present

## 2018-09-29 DIAGNOSIS — Z7189 Other specified counseling: Secondary | ICD-10-CM | POA: Diagnosis not present

## 2018-09-29 DIAGNOSIS — I4821 Permanent atrial fibrillation: Secondary | ICD-10-CM | POA: Diagnosis not present

## 2018-09-29 DIAGNOSIS — M150 Primary generalized (osteo)arthritis: Secondary | ICD-10-CM | POA: Diagnosis not present

## 2018-09-29 DIAGNOSIS — E782 Mixed hyperlipidemia: Secondary | ICD-10-CM | POA: Diagnosis not present

## 2018-09-29 DIAGNOSIS — M1A0790 Idiopathic chronic gout, unspecified ankle and foot, without tophus (tophi): Secondary | ICD-10-CM | POA: Diagnosis not present

## 2018-11-08 ENCOUNTER — Telehealth: Payer: Self-pay

## 2018-11-08 ENCOUNTER — Ambulatory Visit: Payer: Self-pay

## 2018-11-08 DIAGNOSIS — Z20822 Contact with and (suspected) exposure to covid-19: Secondary | ICD-10-CM

## 2018-11-08 NOTE — Telephone Encounter (Signed)
Pt. Scheduled for COVID 19 test.

## 2018-11-08 NOTE — Telephone Encounter (Signed)
See telephone note.

## 2018-11-08 NOTE — Telephone Encounter (Signed)
George [name] from [facility] Dr. Mathis Fare office calling to get pt scheduled for COVID test. Symptoms: Shortness of breath and cough.  Pt can be reached at [phone].  Please have results sent to Dr. Mathis Fare office (fax: [phone])

## 2018-11-09 ENCOUNTER — Other Ambulatory Visit: Payer: Medicare Other

## 2018-11-09 DIAGNOSIS — R6889 Other general symptoms and signs: Secondary | ICD-10-CM | POA: Diagnosis not present

## 2018-11-09 DIAGNOSIS — Z20822 Contact with and (suspected) exposure to covid-19: Secondary | ICD-10-CM

## 2018-11-13 LAB — NOVEL CORONAVIRUS, NAA: SARS-CoV-2, NAA: NOT DETECTED

## 2018-11-17 ENCOUNTER — Telehealth: Payer: Self-pay

## 2018-11-17 NOTE — Telephone Encounter (Signed)
COVID results faxed to Dr. Ashby Dawes @ [phone]

## 2018-11-25 ENCOUNTER — Encounter: Payer: Self-pay | Admitting: Orthopedic Surgery

## 2018-11-25 ENCOUNTER — Other Ambulatory Visit: Payer: Self-pay

## 2018-11-25 ENCOUNTER — Ambulatory Visit (INDEPENDENT_AMBULATORY_CARE_PROVIDER_SITE_OTHER): Payer: Medicare Other | Admitting: Orthopedic Surgery

## 2018-11-25 DIAGNOSIS — M1712 Unilateral primary osteoarthritis, left knee: Secondary | ICD-10-CM

## 2018-11-25 DIAGNOSIS — M1711 Unilateral primary osteoarthritis, right knee: Secondary | ICD-10-CM

## 2018-11-25 MED ORDER — LIDOCAINE HCL 1 % IJ SOLN
5.0000 mL | INTRAMUSCULAR | Status: AC | PRN
  Administered 2018-11-25: 5 mL

## 2018-11-25 MED ORDER — METHYLPREDNISOLONE ACETATE 40 MG/ML IJ SUSP
40.0000 mg | INTRAMUSCULAR | Status: AC | PRN
  Administered 2018-11-25: 40 mg via INTRA_ARTICULAR

## 2018-11-25 MED ORDER — BUPIVACAINE HCL 0.25 % IJ SOLN
4.0000 mL | INTRAMUSCULAR | Status: AC | PRN
  Administered 2018-11-25: 4 mL via INTRA_ARTICULAR

## 2018-11-25 NOTE — Progress Notes (Signed)
   Procedure Note  Patient: George [name]             Date of Birth: 07/14/1923           MRN: [id number]             Visit Date: 11/25/2018  Procedures: Visit Diagnoses:  1. Unilateral primary osteoarthritis, left knee   2. Unilateral primary osteoarthritis, right knee     Large Joint Inj: bilateral knee on 11/25/2018 7:08 PM Indications: diagnostic evaluation, joint swelling and pain Details: 18 G 1.5 in needle, superolateral approach  Arthrogram: No  Medications (Right): 5 mL lidocaine 1 %; 4 mL bupivacaine 0.25 %; 40 mg methylPREDNISolone acetate 40 MG/ML Medications (Left): 5 mL lidocaine 1 %; 4 mL bupivacaine 0.25 %; 40 mg methylPREDNISolone acetate 40 MG/ML Outcome: tolerated well, no immediate complications Procedure, treatment alternatives, risks and benefits explained, specific risks discussed. Consent was given by the patient. Immediately prior to procedure a time out was called to verify the correct patient, procedure, equipment, support staff and site/side marked as required. Patient was prepped and draped in the usual sterile fashion.

## 2018-12-06 ENCOUNTER — Telehealth: Payer: Self-pay

## 2018-12-06 NOTE — Telephone Encounter (Signed)
Pt is scheduled on Dr. Judeth Cornfield virtual clinic day. Called pt to offer to change appointment to virtual or reschedule. Pt state he would prefer to be seen in office. Appointment rescheduled for 9/2.

## 2018-12-07 ENCOUNTER — Ambulatory Visit: Payer: Medicare Other | Admitting: Cardiology

## 2018-12-13 DIAGNOSIS — Z8546 Personal history of malignant neoplasm of prostate: Secondary | ICD-10-CM | POA: Diagnosis not present

## 2018-12-17 DIAGNOSIS — C61 Malignant neoplasm of prostate: Secondary | ICD-10-CM | POA: Diagnosis not present

## 2018-12-21 ENCOUNTER — Other Ambulatory Visit: Payer: Self-pay

## 2018-12-21 ENCOUNTER — Encounter: Payer: Self-pay | Admitting: Podiatry

## 2018-12-21 ENCOUNTER — Ambulatory Visit (INDEPENDENT_AMBULATORY_CARE_PROVIDER_SITE_OTHER): Payer: Medicare Other | Admitting: Podiatry

## 2018-12-21 VITALS — Temp 97.7°F

## 2018-12-21 DIAGNOSIS — B351 Tinea unguium: Secondary | ICD-10-CM | POA: Diagnosis not present

## 2018-12-21 DIAGNOSIS — M79676 Pain in unspecified toe(s): Secondary | ICD-10-CM | POA: Diagnosis not present

## 2018-12-21 DIAGNOSIS — M79609 Pain in unspecified limb: Secondary | ICD-10-CM

## 2018-12-21 NOTE — Patient Instructions (Signed)
Athlete's Foot  Athlete's foot (tinea pedis) is a fungal infection of the skin on your feet. It often occurs on the skin that is between or underneath the toes. It can also occur on the soles of your feet. The infection can spread from person to person (is contagious). It can also spread when a person's bare feet come in contact with the fungus on shower floors or on items such as shoes. What are the causes? This condition is caused by a fungus that grows in warm, moist places. You can get athlete's foot by sharing shoes, shower stalls, towels, and wet floors with someone who is infected. Not washing your feet or changing your socks often enough can also lead to athlete's foot. What increases the risk? This condition is more likely to develop in:  Men.  People who have a weak body defense system (immune system).  People who have diabetes.  People who use public showers, such as at a gym.  People who wear heavy-duty shoes, such as industrial or military shoes.  Seasons with warm, humid weather. What are the signs or symptoms? Symptoms of this condition include:  Itchy areas between your toes or on the soles of your feet.  White, flaky, or scaly areas between your toes or on the soles of your feet.  Very itchy small blisters between your toes or on the soles of your feet.  Small cuts in your skin. These cuts can become infected.  Thick or discolored toenails. How is this diagnosed? This condition may be diagnosed with a physical exam and a review of your medical history. Your health care provider may also take a skin or toenail sample to examine under a microscope. How is this treated? This condition is treated with antifungal medicines. These may be applied as powders, ointments, or creams. In severe cases, an oral antifungal medicine may be given. Follow these instructions at home: Medicines  Apply or take over-the-counter and prescription medicines only as told by your health  care provider.  Apply your antifungal medicine as told by your health care provider. Do not stop using the antifungal even if your condition improves. Foot care  Do not scratch your feet.  Keep your feet dry: ? Wear cotton or wool socks. Change your socks every day or if they become wet. ? Wear shoes that allow air to flow, such as sandals or canvas tennis shoes.  Wash and dry your feet, including the area between your toes. Also, wash and dry your feet: ? Every day or as told by your health care provider. ? After exercising. General instructions  Do not let others use towels, shoes, nail clippers, or other personal items that touch your feet.  Protect your feet by wearing sandals in wet areas, such as locker rooms and shared showers.  Keep all follow-up visits as told by your health care provider. This is important.  If you have diabetes, keep your blood sugar under control. Contact a health care provider if:  You have a fever.  You have swelling, soreness, warmth, or redness in your foot.  Your feet are not getting better with treatment.  Your symptoms get worse.  You have new symptoms. Summary  Athlete's foot (tinea pedis) is a fungal infection of the skin on your feet. It often occurs on skin that is between or underneath the toes.  This condition is caused by a fungus that grows in warm, moist places.  Symptoms include white, flaky, or scaly areas between   your toes or on the soles of your feet.  This condition is treated with antifungal medicines.  Keep your feet clean. Always dry them thoroughly. This information is not intended to replace advice given to you by your health care provider. Make sure you discuss any questions you have with your health care provider. Document Released: 04/18/2000 Document Revised: 04/16/2017 Document Reviewed: 02/09/2017 Elsevier Patient Education  2020 [employer]   Onychomycosis/Fungal Toenails  WHAT IS IT? An infection  that lies within the keratin of your nail plate that is caused by a fungus.  WHY ME? Fungal infections affect all ages, sexes, races, and creeds.  There may be many factors that predispose you to a fungal infection such as age, coexisting medical conditions such as diabetes, or an autoimmune disease; stress, medications, fatigue, genetics, etc.  Bottom line: fungus thrives in a warm, moist environment and your shoes offer such a location.  IS IT CONTAGIOUS? Theoretically, yes.  You do not want to share shoes, nail clippers or files with someone who has fungal toenails.  Walking around barefoot in the same room or sleeping in the same bed is unlikely to transfer the organism.  It is important to realize, however, that fungus can spread easily from one nail to the next on the same foot.  HOW DO WE TREAT THIS?  There are several ways to treat this condition.  Treatment may depend on many factors such as age, medications, pregnancy, liver and kidney conditions, etc.  It is best to ask your doctor which options are available to you.  1. No treatment.   Unlike many other medical concerns, you can live with this condition.  However for many people this can be a painful condition and may lead to ingrown toenails or a bacterial infection.  It is recommended that you keep the nails cut short to help reduce the amount of fungal nail. 2. Topical treatment.  These range from herbal remedies to prescription strength nail lacquers.  About 40-50% effective, topicals require twice daily application for approximately 9 to 12 months or until an entirely new nail has grown out.  The most effective topicals are medical grade medications available through physicians offices. 3. Oral antifungal medications.  With an 80-90% cure rate, the most common oral medication requires 3 to 4 months of therapy and stays in your system for a year as the new nail grows out.  Oral antifungal medications do require blood work to make sure it is  a safe drug for you.  A liver function panel will be performed prior to starting the medication and after the first month of treatment.  It is important to have the blood work performed to avoid any harmful side effects.  In general, this medication safe but blood work is required. 4. Laser Therapy.  This treatment is performed by applying a specialized laser to the affected nail plate.  This therapy is noninvasive, fast, and non-painful.  It is not covered by insurance and is therefore, out of pocket.  The results have been very good with a 80-95% cure rate.  [facility] is the only practice in the area to offer this therapy. 5. Permanent Nail Avulsion.  Removing the entire nail so that a new nail will not grow back. 

## 2018-12-30 NOTE — Progress Notes (Signed)
Subjective:  George [name] presents to clinic today with cc of  painful, thick, discolored, elongated toenails 1-5 b/l that become tender and cannot cut because of thickness. Pain is aggravated when wearing enclosed shoe gear.  Mr. Nordmann is been using the topical antifungal medication for his toenails from [place].  He is pleased with the progress his toenails have made.  Merrilee Seashore, MD is his PCP.  He remains on blood thinner Eliquis.   Current Outpatient Medications:  .  apixaban (ELIQUIS) 5 MG TABS tablet, Take 1 tablet (5 mg total) by mouth 2 (two) times daily., Disp: 60 tablet, Rfl:  .  COLCRYS 0.6 MG tablet, , Disp: , Rfl:  .  diazepam (VALIUM) 2 MG tablet, Take 1 tablet (2 mg total) by mouth every 8 (eight) hours as needed for muscle spasms., Disp: 10 tablet, Rfl: 0 .  HYDROcodone-acetaminophen (NORCO/VICODIN) 5-325 MG tablet, Take 1 tablet by mouth every 4 (four) hours as needed., Disp: 10 tablet, Rfl: 0 .  nitroGLYCERIN (NITROSTAT) 0.4 MG SL tablet, Place 1 tablet (0.4 mg total) under the tongue every 5 (five) minutes as needed for chest pain., Disp: 30 tablet, Rfl: 12 .  NONFORMULARY OR COMPOUNDED ITEM, [facility]:  Antifungal Cream - Terbinafine 3%, Fluconazole 2%, Tea Tree Oil 5%, Urea 10%, Ibuprofen 2%, in DMSO #41ml suspension, apply to affected toenail(s) once at bedtime., Disp: 30 each, Rfl: 5 .  pantoprazole (PROTONIX) 40 MG tablet, Take 1 tablet (40 mg total) by mouth daily., Disp: 30 tablet, Rfl: 3 .  rosuvastatin (CRESTOR) 10 MG tablet, Take 10 mg by mouth every other day. , Disp: , Rfl:    Allergies  Allergen Reactions  . Aspirin     bleeding     Objective: Vitals:   12/21/18 1029  Temp: 97.7 F (36.5 C)    Physical Examination:  Vascular Examination: Capillary refill time immediate x 10 digits.  Palpable DP/PT pulses b/l.  Digital hair present b/l.  No edema noted b/l.  Skin temperature gradient WNL  b/l.  Dermatological Examination: Skin with normal turgor, texture and tone b/l.  No open wounds b/l.  No interdigital macerations noted b/l.  Elongated, thick, discolored soft toenails with subungual debris and pain on dorsal palpation of nailbeds 1-5 b/l.  There is noted clearing of the proximal two thirds of the nail plates.  Musculoskeletal Examination: Muscle strength 5/5 to all muscle groups b/l.  No pain, crepitus or joint discomfort with active/passive ROM.  Neurological Examination: Sensation intact 5/5 b/l with 10 gram monofilament.  Vibratory sensation intact b/l.  Proprioceptive sensation intact b/l.  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 Toenails 1-5 b/l were debrided in length and girth without iatrogenic laceration.  Mr. Goette instructed to discontinue the topical antifungal for the next 3 months and we will know his progress as he is nails grow out.  He related understanding. 2.  Continue soft, supportive shoe gear daily. 3.  Report any pedal injuries to medical professional. 4.  Follow up 3 months. 5.  Patient/POA to call should there be a question/concern in there interim.

## 2019-01-05 ENCOUNTER — Other Ambulatory Visit: Payer: Self-pay

## 2019-01-05 ENCOUNTER — Ambulatory Visit (INDEPENDENT_AMBULATORY_CARE_PROVIDER_SITE_OTHER): Payer: Medicare Other | Admitting: Cardiology

## 2019-01-05 VITALS — BP 158/90 | HR 89 | Temp 98.2°F | Ht 70.0 in | Wt 178.0 lb

## 2019-01-05 DIAGNOSIS — Z7901 Long term (current) use of anticoagulants: Secondary | ICD-10-CM

## 2019-01-05 DIAGNOSIS — E7800 Pure hypercholesterolemia, unspecified: Secondary | ICD-10-CM | POA: Diagnosis not present

## 2019-01-05 DIAGNOSIS — I482 Chronic atrial fibrillation, unspecified: Secondary | ICD-10-CM

## 2019-01-05 DIAGNOSIS — I480 Paroxysmal atrial fibrillation: Secondary | ICD-10-CM | POA: Diagnosis not present

## 2019-01-05 DIAGNOSIS — Z7189 Other specified counseling: Secondary | ICD-10-CM | POA: Diagnosis not present

## 2019-01-05 DIAGNOSIS — I2510 Atherosclerotic heart disease of native coronary artery without angina pectoris: Secondary | ICD-10-CM | POA: Diagnosis not present

## 2019-01-05 NOTE — Progress Notes (Signed)
Cardiology Office Note:    Date:  01/05/2019   ID:  George [name], DOB [birth year], MRN [id number]  PCP:  Merrilee Seashore, MD  Cardiologist:  Buford Dresser, MD PhD  Referring MD: Merrilee Seashore, MD   No chief complaint on file.   History of Present Illness:    George [name] is an absolutely delightful 85 y.o. male with a hx of atrial fibrillation, hyperlipidemia, GERD, prostate cancer (observation) who is seen as a new patient to me/prior patient of Dr. Wynonia Lawman.  Patient concerns today: neuropathy and balance issues, as well as arthritic knees and and prior hip replacements, limit activity level. Prostate cancer just being observed. He is a self-professed "couch potato" due to his chronic conditions. Does light housework but if he does anything strenuous he gets out of breath. Does feel better as the day goes on. Was very active as a child, played many sports. Took a toll on his joints. No falls recently, last fall 1-2 years ago. Engaged to 83 yo fiancee, both lost their first spouses. Was a police officer/detective for 20 years. Misses socialization/people. Retired 30 years ago, then went back to [employer] after retirement.   Takes blood pressure at home, within normal range at home. On no BP meds. Always elevated at the doctor's office.  No issues with anticoagulation. No melena/hematochezia/hematuria.  Denies chest pain, shortness of breath at rest. No PND, orthopnea, LE edema or unexpected weight gain. No syncope or palpitations.  Past Medical History:  Diagnosis Date  . BPH (benign prostatic hypertrophy)   . Chronic atrial fibrillation    CHA2DS2VASC score 3    . Esophageal ring   . GERD (gastroesophageal reflux disease)   . History of GI bleed   . Hypercholesteremia   . Osteoarthritis   . Osteoporosis   . Peripheral neuropathy   . Peripheral neuropathy   . Prostate cancer ([place])    treated with observation  . Trigger finger of both hands   .  Vertigo   . Vitamin D deficiency     Past Surgical History:  Procedure Laterality Date  . BALLOON DILATION N/A 08/10/2013   Procedure: BALLOON DILATION;  Surgeon: Arta Silence, MD;  Location: [facility];  Service: Endoscopy;  Laterality: N/A;  . BOTOX INJECTION N/A 08/10/2013   Procedure: BOTOX INJECTION;  Surgeon: Arta Silence, MD;  Location: [facility];  Service: Endoscopy;  Laterality: N/A;  + or -      balloon/botox  . CARDIAC CATHETERIZATION    . CHOLECYSTECTOMY  2010  . ESOPHAGOGASTRODUODENOSCOPY (EGD) WITH PROPOFOL N/A 08/10/2013   Procedure: ESOPHAGOGASTRODUODENOSCOPY (EGD) WITH PROPOFOL;  Surgeon: Arta Silence, MD;  Location: [facility];  Service: Endoscopy;  Laterality: N/A;  . INGUINAL HERNIA REPAIR    . KNEE ARTHROCENTESIS    . skin cancer removed from face  1 week ago   area healing well, some areas reved in past also  . TOTAL HIP ARTHROPLASTY Bilateral yrs ago   hips    Current Medications: Current Outpatient Medications on File Prior to Visit  Medication Sig  . apixaban (ELIQUIS) 5 MG TABS tablet Take 1 tablet (5 mg total) by mouth 2 (two) times daily.  Marland Kitchen COLCRYS 0.6 MG tablet   . diazepam (VALIUM) 2 MG tablet Take 1 tablet (2 mg total) by mouth every 8 (eight) hours as needed for muscle spasms.  Marland Kitchen HYDROcodone-acetaminophen (NORCO/VICODIN) 5-325 MG tablet Take 1 tablet by mouth every 4 (four) hours as needed.  . nitroGLYCERIN (NITROSTAT) 0.4  MG SL tablet Place 1 tablet (0.4 mg total) under the tongue every 5 (five) minutes as needed for chest pain.  . NONFORMULARY OR COMPOUNDED ITEM [facility]:  Antifungal Cream - Terbinafine 3%, Fluconazole 2%, Tea Tree Oil 5%, Urea 10%, Ibuprofen 2%, in DMSO #73ml suspension, apply to affected toenail(s) once at bedtime.  . pantoprazole (PROTONIX) 40 MG tablet Take 1 tablet (40 mg total) by mouth daily.  . rosuvastatin (CRESTOR) 10 MG tablet Take 10 mg by mouth every other day.    No current facility-administered  medications on file prior to visit.      Allergies:   Aspirin   Social History   Socioeconomic History  . Marital status: Widowed    Spouse name: Not on file  . Number of children: Not on file  . Years of education: Not on file  . Highest education level: Not on file  Occupational History  . Not on file  Social Needs  . Financial resource strain: Not on file  . Food insecurity    Worry: Not on file    Inability: Not on file  . Transportation needs    Medical: Not on file    Non-medical: Not on file  Tobacco Use  . Smoking status: Never Smoker  . Smokeless tobacco: Never Used  Substance and Sexual Activity  . Alcohol use: No  . Drug use: No  . Sexual activity: Not on file  Lifestyle  . Physical activity    Days per week: Not on file    Minutes per session: Not on file  . Stress: Not on file  Relationships  . Social Herbalist on phone: Not on file    Gets together: Not on file    Attends religious service: Not on file    Active member of club or organization: Not on file    Attends meetings of clubs or organizations: Not on file    Relationship status: Not on file  Other Topics Concern  . Not on file  Social History Narrative  . Not on file     Family History: The patient's family history includes Cancer - Other in his father.  ROS:   Please see the history of present illness.  Additional pertinent ROS: Constitutional: Negative for chills, fever, night sweats, unintentional weight loss  HENT: Negative for ear pain and hearing loss.   Eyes: Negative for loss of vision and eye pain.  Respiratory: Negative for cough, sputum, wheezing.   Cardiovascular: See HPI. Gastrointestinal: Negative for abdominal pain, melena, and hematochezia.  Genitourinary: Negative for dysuria and hematuria.  Musculoskeletal: Negative for falls and myalgias.  Skin: Negative for itching and rash.  Neurological: Negative for focal weakness, focal sensory changes and loss of  consciousness.  Endo/Heme/Allergies: Does bruise/bleed easily.     EKGs/Labs/Other Studies Reviewed:    The following studies were reviewed today: Prior notes from Dr. Wynonia Lawman.  Cath 2000: no obstructive disease Echo 2016: EF 60%  EKG:  EKG is personally reviewed.  The ekg ordered today demonstrates atrial fibrillation, rate controlled at 89 bpm.  Recent Labs: No results found for requested labs within last 8760 hours.  Recent Lipid Panel No results found for: CHOL, TRIG, HDL, CHOLHDL, VLDL, LDLCALC, LDLDIRECT  Labs reviewed from his PCP today: Cr 1.0, K 4.3, LFTs nl, TSH borderline elevated but fT4 normal. Tchol 129, TG 78, HDL 53, LDL 60. CBC with Hgb 13.9, MCV 100, plt 200  Physical Exam:  VS:  BP (!) 158/90   Pulse 89   Temp 98.2 F (36.8 C) (Temporal)   Ht 5\' 10"  (1.778 m)   Wt 178 lb (80.7 kg)   SpO2 97%   BMI 25.54 kg/m     Wt Readings from Last 3 Encounters:  01/05/19 178 lb (80.7 kg)  11/06/17 175 lb (79.4 kg)  12/15/14 168 lb 4.8 oz (76.3 kg)     GEN: Well nourished, well developed in no acute distress HEENT: Normal, moist mucous membranes NECK: No JVD CARDIAC: regular rhythm, normal S1 and S2, no murmurs, rubs, gallops.  VASCULAR: Radial and DP pulses 2+ bilaterally. No carotid bruits RESPIRATORY:  Clear to auscultation without rales, wheezing or rhonchi  ABDOMEN: Soft, non-tender, non-distended MUSCULOSKELETAL:  Ambulates independently SKIN: Warm and dry, no edema NEUROLOGIC:  Alert and oriented x 3. No focal neuro deficits noted. PSYCHIATRIC:  Normal affect    ASSESSMENT:    1. Chronic atrial fibrillation   2. Current use of long term anticoagulation   3. Pure hypercholesterolemia   4. Non-occlusive coronary artery disease   5. Counseling on health promotion and disease prevention    PLAN:    Permanent atrial fibrillation, on long term anticoagulation: doing well overall. No falls, no gross bleeding. CBC Hgb WNL, though MCV slightly elevated.  Renal function normal.  -continue apixaban 5mg  BID (despite age, his weight and renal function need the regular dose) -CHA2DS2/VAS Stroke Risk Points=2 only for age; BP is elevated today which would give him another point, but he states it is normal at home on no meds -rate controlled on no agents  History of nonobstructive CAD by cath in 2000, hypercholesterolemia: -no chest pain -last LDL 60 on PCP labs -though he is above guideline cutoff age, he is doing very well. Will continue rosuvastatin 10 mg every other day  Diet and exercise/prevention recommendations: -recommend heart healthy/Mediterranean diet, with whole grains, fruits, vegetable, fish, lean meats, nuts, and olive oil. Limit salt. -we discussed AHA recommendations for walking, which he cannot due because of joint pain. Encouraged him to get CV exercise in whatever way he can tolerate -recommend avoidance of tobacco products. Avoid excess alcohol.  Plan for follow up: 6 mos or sooner PRN  Medication Adjustments/Labs and Tests Ordered: Current medicines are reviewed at length with the patient today.  Concerns regarding medicines are outlined above.  No orders of the defined types were placed in this encounter.  No orders of the defined types were placed in this encounter.   Patient Instructions  Medication Instructions:  Ok to check with Primary Care about taking Gabapentin     If you need a refill on your cardiac medications before your next appointment, please call your pharmacy.   Lab work: None  Testing/Procedures: None  Follow-Up: Your physician recommends that you schedule a follow-up appointment in 6 months with Dr. Harrell Gave.       Signed, Buford Dresser, MD PhD 01/05/2019  [place]

## 2019-01-05 NOTE — Patient Instructions (Addendum)
Medication Instructions:  Ok to check with Primary Care about taking Gabapentin     If you need a refill on your cardiac medications before your next appointment, please call your pharmacy.   Lab work: None  Testing/Procedures: None  Follow-Up: Your physician recommends that you schedule a follow-up appointment in 6 months with Dr. Harrell Gave.

## 2019-01-06 ENCOUNTER — Encounter: Payer: Self-pay | Admitting: Cardiology

## 2019-01-06 DIAGNOSIS — I2510 Atherosclerotic heart disease of native coronary artery without angina pectoris: Secondary | ICD-10-CM | POA: Insufficient documentation

## 2019-01-06 DIAGNOSIS — Z7901 Long term (current) use of anticoagulants: Secondary | ICD-10-CM | POA: Insufficient documentation

## 2019-01-06 DIAGNOSIS — E7800 Pure hypercholesterolemia, unspecified: Secondary | ICD-10-CM | POA: Insufficient documentation

## 2019-01-17 ENCOUNTER — Other Ambulatory Visit (INDEPENDENT_AMBULATORY_CARE_PROVIDER_SITE_OTHER): Payer: Medicare Other

## 2019-01-17 DIAGNOSIS — I480 Paroxysmal atrial fibrillation: Secondary | ICD-10-CM

## 2019-01-17 DIAGNOSIS — E7800 Pure hypercholesterolemia, unspecified: Secondary | ICD-10-CM

## 2019-01-17 DIAGNOSIS — Z7901 Long term (current) use of anticoagulants: Secondary | ICD-10-CM

## 2019-01-17 DIAGNOSIS — I482 Chronic atrial fibrillation, unspecified: Secondary | ICD-10-CM

## 2019-01-18 ENCOUNTER — Other Ambulatory Visit: Payer: Self-pay

## 2019-02-24 DIAGNOSIS — Z96642 Presence of left artificial hip joint: Secondary | ICD-10-CM | POA: Diagnosis not present

## 2019-02-24 DIAGNOSIS — Z471 Aftercare following joint replacement surgery: Secondary | ICD-10-CM | POA: Diagnosis not present

## 2019-02-24 DIAGNOSIS — Z96641 Presence of right artificial hip joint: Secondary | ICD-10-CM | POA: Diagnosis not present

## 2019-02-24 DIAGNOSIS — M170 Bilateral primary osteoarthritis of knee: Secondary | ICD-10-CM | POA: Diagnosis not present

## 2019-02-24 DIAGNOSIS — Z96643 Presence of artificial hip joint, bilateral: Secondary | ICD-10-CM | POA: Diagnosis not present

## 2019-02-24 DIAGNOSIS — M25561 Pain in right knee: Secondary | ICD-10-CM | POA: Insufficient documentation

## 2019-02-24 DIAGNOSIS — M25562 Pain in left knee: Secondary | ICD-10-CM | POA: Insufficient documentation

## 2019-03-08 ENCOUNTER — Other Ambulatory Visit: Payer: Self-pay

## 2019-03-08 DIAGNOSIS — Z20828 Contact with and (suspected) exposure to other viral communicable diseases: Secondary | ICD-10-CM | POA: Diagnosis not present

## 2019-03-08 DIAGNOSIS — Z20822 Contact with and (suspected) exposure to covid-19: Secondary | ICD-10-CM

## 2019-03-09 LAB — NOVEL CORONAVIRUS, NAA: SARS-CoV-2, NAA: NOT DETECTED

## 2019-03-16 DIAGNOSIS — L570 Actinic keratosis: Secondary | ICD-10-CM | POA: Diagnosis not present

## 2019-03-16 DIAGNOSIS — D485 Neoplasm of uncertain behavior of skin: Secondary | ICD-10-CM | POA: Diagnosis not present

## 2019-03-16 DIAGNOSIS — L821 Other seborrheic keratosis: Secondary | ICD-10-CM | POA: Diagnosis not present

## 2019-03-16 DIAGNOSIS — Z85828 Personal history of other malignant neoplasm of skin: Secondary | ICD-10-CM | POA: Diagnosis not present

## 2019-03-23 ENCOUNTER — Ambulatory Visit (INDEPENDENT_AMBULATORY_CARE_PROVIDER_SITE_OTHER): Payer: Medicare Other | Admitting: Podiatry

## 2019-03-23 ENCOUNTER — Encounter: Payer: Self-pay | Admitting: Podiatry

## 2019-03-23 ENCOUNTER — Other Ambulatory Visit: Payer: Self-pay

## 2019-03-23 DIAGNOSIS — B351 Tinea unguium: Secondary | ICD-10-CM

## 2019-03-23 DIAGNOSIS — M79676 Pain in unspecified toe(s): Secondary | ICD-10-CM | POA: Diagnosis not present

## 2019-03-23 DIAGNOSIS — M79609 Pain in unspecified limb: Secondary | ICD-10-CM

## 2019-03-23 NOTE — Patient Instructions (Signed)

## 2019-03-30 DIAGNOSIS — G609 Hereditary and idiopathic neuropathy, unspecified: Secondary | ICD-10-CM | POA: Diagnosis not present

## 2019-03-30 DIAGNOSIS — E782 Mixed hyperlipidemia: Secondary | ICD-10-CM | POA: Diagnosis not present

## 2019-03-30 DIAGNOSIS — Z0000 Encounter for general adult medical examination without abnormal findings: Secondary | ICD-10-CM | POA: Diagnosis not present

## 2019-03-30 DIAGNOSIS — M150 Primary generalized (osteo)arthritis: Secondary | ICD-10-CM | POA: Diagnosis not present

## 2019-03-30 DIAGNOSIS — R5383 Other fatigue: Secondary | ICD-10-CM | POA: Diagnosis not present

## 2019-03-30 DIAGNOSIS — I4821 Permanent atrial fibrillation: Secondary | ICD-10-CM | POA: Diagnosis not present

## 2019-03-30 NOTE — Progress Notes (Signed)
Subjective: George [name] is seen today for follow up painful, elongated, thickened toenails bilateral feet that he cannot cut. Pain interferes with daily activities. Aggravating factor includes wearing enclosed shoe gear and relieved with periodic debridement.  On blood thinner, Eliquis.  Medications reviewed in chart.  Allergies  Allergen Reactions  . Aspirin     bleeding     Objective:  Vascular Examination: Capillary refill time immediate b/l.  Dorsalis pedis present b/l.  Posterior tibial pulses present b/l.  Digital hair  present x 10 digits.  Skin temperature gradient WNL b/l.   Dermatological Examination: Skin with normal turgor, texture and tone b/l.  Toenails 1-5 b/l discolored, thick, dystrophic with subungual debris and pain with palpation to nailbeds due to thickness of nails.  Musculoskeletal: Muscle strength 5/5 to all LE muscle groups b/l.  No gross bony deformities b/l.  No pain, crepitus or joint limitation noted with ROM.   Neurological Examination: Protective sensation intact with 10 gram monofilament bilaterally.  Epicritic sensation present bilaterally.  Vibratory sensation intact bilaterally.   Assessment: Painful onychomycosis toenails 1-5 b/l   Plan: 1. Toenails 1-5 b/l were debrided in length and girth without iatrogenic bleeding. 2. Patient to continue soft, supportive shoe gear daily. 3. Patient to report any pedal injuries to medical professional immediately. 4. Follow up 6 months per patient request. 5. Patient/POA to call should there be a concern in the interim.

## 2019-04-06 DIAGNOSIS — N401 Enlarged prostate with lower urinary tract symptoms: Secondary | ICD-10-CM | POA: Diagnosis not present

## 2019-04-06 DIAGNOSIS — M1A0790 Idiopathic chronic gout, unspecified ankle and foot, without tophus (tophi): Secondary | ICD-10-CM | POA: Diagnosis not present

## 2019-04-06 DIAGNOSIS — I4821 Permanent atrial fibrillation: Secondary | ICD-10-CM | POA: Diagnosis not present

## 2019-04-06 DIAGNOSIS — G609 Hereditary and idiopathic neuropathy, unspecified: Secondary | ICD-10-CM | POA: Diagnosis not present

## 2019-04-06 DIAGNOSIS — E782 Mixed hyperlipidemia: Secondary | ICD-10-CM | POA: Diagnosis not present

## 2019-04-06 DIAGNOSIS — D692 Other nonthrombocytopenic purpura: Secondary | ICD-10-CM | POA: Diagnosis not present

## 2019-04-06 DIAGNOSIS — E039 Hypothyroidism, unspecified: Secondary | ICD-10-CM | POA: Diagnosis not present

## 2019-04-06 DIAGNOSIS — M150 Primary generalized (osteo)arthritis: Secondary | ICD-10-CM | POA: Diagnosis not present

## 2019-04-06 DIAGNOSIS — M810 Age-related osteoporosis without current pathological fracture: Secondary | ICD-10-CM | POA: Diagnosis not present

## 2019-04-22 ENCOUNTER — Other Ambulatory Visit: Payer: Self-pay

## 2019-04-22 ENCOUNTER — Inpatient Hospital Stay (HOSPITAL_COMMUNITY)
Admission: EM | Admit: 2019-04-22 | Discharge: 2019-05-04 | DRG: 480 | Disposition: A | Discharge status: Skilled Nursing Facility | Payer: Medicare Other | Attending: Internal Medicine | Admitting: Internal Medicine

## 2019-04-22 ENCOUNTER — Emergency Department (HOSPITAL_COMMUNITY): Payer: Medicare Other

## 2019-04-22 ENCOUNTER — Encounter (HOSPITAL_COMMUNITY): Payer: Self-pay | Admitting: Internal Medicine

## 2019-04-22 DIAGNOSIS — Y92094 Garage of other non-institutional residence as the place of occurrence of the external cause: Secondary | ICD-10-CM

## 2019-04-22 DIAGNOSIS — I5033 Acute on chronic diastolic (congestive) heart failure: Secondary | ICD-10-CM | POA: Diagnosis not present

## 2019-04-22 DIAGNOSIS — N400 Enlarged prostate without lower urinary tract symptoms: Secondary | ICD-10-CM | POA: Diagnosis present

## 2019-04-22 DIAGNOSIS — C61 Malignant neoplasm of prostate: Secondary | ICD-10-CM | POA: Diagnosis not present

## 2019-04-22 DIAGNOSIS — D72820 Lymphocytosis (symptomatic): Secondary | ICD-10-CM | POA: Diagnosis not present

## 2019-04-22 DIAGNOSIS — D62 Acute posthemorrhagic anemia: Secondary | ICD-10-CM | POA: Diagnosis not present

## 2019-04-22 DIAGNOSIS — E559 Vitamin D deficiency, unspecified: Secondary | ICD-10-CM | POA: Diagnosis present

## 2019-04-22 DIAGNOSIS — Z79891 Long term (current) use of opiate analgesic: Secondary | ICD-10-CM | POA: Diagnosis not present

## 2019-04-22 DIAGNOSIS — R0600 Dyspnea, unspecified: Secondary | ICD-10-CM

## 2019-04-22 DIAGNOSIS — Z20828 Contact with and (suspected) exposure to other viral communicable diseases: Secondary | ICD-10-CM | POA: Diagnosis present

## 2019-04-22 DIAGNOSIS — K449 Diaphragmatic hernia without obstruction or gangrene: Secondary | ICD-10-CM | POA: Diagnosis not present

## 2019-04-22 DIAGNOSIS — Z66 Do not resuscitate: Secondary | ICD-10-CM | POA: Diagnosis present

## 2019-04-22 DIAGNOSIS — M2550 Pain in unspecified joint: Secondary | ICD-10-CM | POA: Diagnosis not present

## 2019-04-22 DIAGNOSIS — D72829 Elevated white blood cell count, unspecified: Secondary | ICD-10-CM

## 2019-04-22 DIAGNOSIS — M810 Age-related osteoporosis without current pathological fracture: Secondary | ICD-10-CM | POA: Diagnosis present

## 2019-04-22 DIAGNOSIS — D7589 Other specified diseases of blood and blood-forming organs: Secondary | ICD-10-CM | POA: Diagnosis present

## 2019-04-22 DIAGNOSIS — K5900 Constipation, unspecified: Secondary | ICD-10-CM

## 2019-04-22 DIAGNOSIS — M978XXA Periprosthetic fracture around other internal prosthetic joint, initial encounter: Secondary | ICD-10-CM | POA: Insufficient documentation

## 2019-04-22 DIAGNOSIS — I10 Essential (primary) hypertension: Secondary | ICD-10-CM | POA: Diagnosis not present

## 2019-04-22 DIAGNOSIS — N179 Acute kidney failure, unspecified: Secondary | ICD-10-CM | POA: Diagnosis not present

## 2019-04-22 DIAGNOSIS — M25552 Pain in left hip: Secondary | ICD-10-CM | POA: Diagnosis not present

## 2019-04-22 DIAGNOSIS — Z9049 Acquired absence of other specified parts of digestive tract: Secondary | ICD-10-CM | POA: Diagnosis not present

## 2019-04-22 DIAGNOSIS — W010XXA Fall on same level from slipping, tripping and stumbling without subsequent striking against object, initial encounter: Secondary | ICD-10-CM | POA: Diagnosis present

## 2019-04-22 DIAGNOSIS — A4181 Sepsis due to Enterococcus: Secondary | ICD-10-CM | POA: Diagnosis not present

## 2019-04-22 DIAGNOSIS — E7800 Pure hypercholesterolemia, unspecified: Secondary | ICD-10-CM | POA: Diagnosis present

## 2019-04-22 DIAGNOSIS — Z96643 Presence of artificial hip joint, bilateral: Secondary | ICD-10-CM | POA: Diagnosis present

## 2019-04-22 DIAGNOSIS — Z01811 Encounter for preprocedural respiratory examination: Secondary | ICD-10-CM

## 2019-04-22 DIAGNOSIS — E785 Hyperlipidemia, unspecified: Secondary | ICD-10-CM | POA: Diagnosis present

## 2019-04-22 DIAGNOSIS — M9702XA Periprosthetic fracture around internal prosthetic left hip joint, initial encounter: Secondary | ICD-10-CM | POA: Diagnosis present

## 2019-04-22 DIAGNOSIS — R079 Chest pain, unspecified: Secondary | ICD-10-CM | POA: Diagnosis not present

## 2019-04-22 DIAGNOSIS — M170 Bilateral primary osteoarthritis of knee: Secondary | ICD-10-CM | POA: Diagnosis present

## 2019-04-22 DIAGNOSIS — Z4789 Encounter for other orthopedic aftercare: Secondary | ICD-10-CM | POA: Diagnosis not present

## 2019-04-22 DIAGNOSIS — Z7901 Long term (current) use of anticoagulants: Secondary | ICD-10-CM | POA: Diagnosis not present

## 2019-04-22 DIAGNOSIS — K219 Gastro-esophageal reflux disease without esophagitis: Secondary | ICD-10-CM | POA: Diagnosis not present

## 2019-04-22 DIAGNOSIS — S72009A Fracture of unspecified part of neck of unspecified femur, initial encounter for closed fracture: Secondary | ICD-10-CM | POA: Diagnosis present

## 2019-04-22 DIAGNOSIS — R5381 Other malaise: Secondary | ICD-10-CM | POA: Diagnosis not present

## 2019-04-22 DIAGNOSIS — G629 Polyneuropathy, unspecified: Secondary | ICD-10-CM | POA: Diagnosis not present

## 2019-04-22 DIAGNOSIS — Z7401 Bed confinement status: Secondary | ICD-10-CM | POA: Diagnosis not present

## 2019-04-22 DIAGNOSIS — Z96649 Presence of unspecified artificial hip joint: Secondary | ICD-10-CM | POA: Insufficient documentation

## 2019-04-22 DIAGNOSIS — Z8546 Personal history of malignant neoplasm of prostate: Secondary | ICD-10-CM

## 2019-04-22 DIAGNOSIS — R6510 Systemic inflammatory response syndrome (SIRS) of non-infectious origin without acute organ dysfunction: Secondary | ICD-10-CM | POA: Diagnosis not present

## 2019-04-22 DIAGNOSIS — Z85828 Personal history of other malignant neoplasm of skin: Secondary | ICD-10-CM

## 2019-04-22 DIAGNOSIS — M62838 Other muscle spasm: Secondary | ICD-10-CM | POA: Diagnosis not present

## 2019-04-22 DIAGNOSIS — M978XXD Periprosthetic fracture around other internal prosthetic joint, subsequent encounter: Secondary | ICD-10-CM | POA: Diagnosis not present

## 2019-04-22 DIAGNOSIS — S72302A Unspecified fracture of shaft of left femur, initial encounter for closed fracture: Secondary | ICD-10-CM | POA: Diagnosis present

## 2019-04-22 DIAGNOSIS — G4700 Insomnia, unspecified: Secondary | ICD-10-CM | POA: Diagnosis not present

## 2019-04-22 DIAGNOSIS — I2510 Atherosclerotic heart disease of native coronary artery without angina pectoris: Secondary | ICD-10-CM | POA: Diagnosis present

## 2019-04-22 DIAGNOSIS — M1990 Unspecified osteoarthritis, unspecified site: Secondary | ICD-10-CM | POA: Diagnosis present

## 2019-04-22 DIAGNOSIS — I482 Chronic atrial fibrillation, unspecified: Secondary | ICD-10-CM | POA: Diagnosis not present

## 2019-04-22 DIAGNOSIS — E119 Type 2 diabetes mellitus without complications: Secondary | ICD-10-CM | POA: Diagnosis not present

## 2019-04-22 DIAGNOSIS — N390 Urinary tract infection, site not specified: Secondary | ICD-10-CM | POA: Diagnosis present

## 2019-04-22 DIAGNOSIS — E118 Type 2 diabetes mellitus with unspecified complications: Secondary | ICD-10-CM

## 2019-04-22 DIAGNOSIS — W19XXXA Unspecified fall, initial encounter: Secondary | ICD-10-CM

## 2019-04-22 DIAGNOSIS — M9702XD Periprosthetic fracture around internal prosthetic left hip joint, subsequent encounter: Secondary | ICD-10-CM | POA: Diagnosis not present

## 2019-04-22 DIAGNOSIS — Z79899 Other long term (current) drug therapy: Secondary | ICD-10-CM

## 2019-04-22 DIAGNOSIS — E1142 Type 2 diabetes mellitus with diabetic polyneuropathy: Secondary | ICD-10-CM | POA: Diagnosis present

## 2019-04-22 DIAGNOSIS — S72002D Fracture of unspecified part of neck of left femur, subsequent encounter for closed fracture with routine healing: Secondary | ICD-10-CM | POA: Diagnosis not present

## 2019-04-22 DIAGNOSIS — Z96642 Presence of left artificial hip joint: Secondary | ICD-10-CM | POA: Diagnosis not present

## 2019-04-22 LAB — CBC WITH DIFFERENTIAL/PLATELET
Abs Immature Granulocytes: 0.17 K/uL — ABNORMAL HIGH (ref 0.00–0.07)
Basophils Absolute: 0 K/uL (ref 0.0–0.1)
Basophils Relative: 0 %
Eosinophils Absolute: 0 K/uL (ref 0.0–0.5)
Eosinophils Relative: 0 %
HCT: 41.8 % (ref 39.0–52.0)
Hemoglobin: 13.4 g/dL (ref 13.0–17.0)
Immature Granulocytes: 1 %
Lymphocytes Relative: 4 %
Lymphs Abs: 0.9 K/uL (ref 0.7–4.0)
MCH: 34.3 pg — ABNORMAL HIGH (ref 26.0–34.0)
MCHC: 32.1 g/dL (ref 30.0–36.0)
MCV: 106.9 fL — ABNORMAL HIGH (ref 80.0–100.0)
Monocytes Absolute: 1.8 K/uL — ABNORMAL HIGH (ref 0.1–1.0)
Monocytes Relative: 8 %
Neutro Abs: 21.1 K/uL — ABNORMAL HIGH (ref 1.7–7.7)
Neutrophils Relative %: 87 %
Platelets: 162 K/uL (ref 150–400)
RBC: 3.91 MIL/uL — ABNORMAL LOW (ref 4.22–5.81)
RDW: 14.4 % (ref 11.5–15.5)
WBC: 24 K/uL — ABNORMAL HIGH (ref 4.0–10.5)
nRBC: 0 % (ref 0.0–0.2)

## 2019-04-22 LAB — BASIC METABOLIC PANEL WITH GFR
Anion gap: 12 (ref 5–15)
BUN: 19 mg/dL (ref 8–23)
CO2: 22 mmol/L (ref 22–32)
Calcium: 8.7 mg/dL — ABNORMAL LOW (ref 8.9–10.3)
Chloride: 106 mmol/L (ref 98–111)
Creatinine, Ser: 1.1 mg/dL (ref 0.61–1.24)
GFR calc Af Amer: >60 mL/min
GFR calc non Af Amer: 58 mL/min — ABNORMAL LOW
Glucose, Bld: 174 mg/dL — ABNORMAL HIGH (ref 70–99)
Potassium: 4.8 mmol/L (ref 3.5–5.1)
Sodium: 140 mmol/L (ref 135–145)

## 2019-04-22 LAB — PROTIME-INR
INR: 1.1 (ref 0.8–1.2)
Prothrombin Time: 14 seconds (ref 11.4–15.2)

## 2019-04-22 LAB — TYPE AND SCREEN
ABO/RH(D): O POS
Antibody Screen: NEGATIVE

## 2019-04-22 MED ORDER — OXYCODONE HCL 5 MG PO TABS
5.0000 mg | ORAL_TABLET | ORAL | Status: DC | PRN
  Administered 2019-04-24 – 2019-04-27 (×3): 5 mg via ORAL
  Filled 2019-04-22 (×3): qty 1

## 2019-04-22 MED ORDER — MORPHINE SULFATE (PF) 2 MG/ML IV SOLN
0.5000 mg | INTRAVENOUS | Status: DC | PRN
  Administered 2019-04-23 – 2019-04-24 (×5): 0.5 mg via INTRAVENOUS
  Filled 2019-04-22 (×5): qty 1

## 2019-04-22 MED ORDER — NITROGLYCERIN 0.4 MG SL SUBL: 0.4000 mg | SUBLINGUAL_TABLET | SUBLINGUAL | Status: DC | PRN

## 2019-04-22 MED ORDER — HYDROMORPHONE HCL 1 MG/ML IJ SOLN
0.5000 mg | INTRAMUSCULAR | Status: DC | PRN
  Administered 2019-04-23: 1 mg via INTRAVENOUS
  Administered 2019-04-24: 0.5 mg via INTRAVENOUS
  Administered 2019-04-25 (×2): 1 mg via INTRAVENOUS
  Filled 2019-04-22 (×4): qty 1

## 2019-04-22 MED ORDER — ACETAMINOPHEN 325 MG PO TABS
650.0000 mg | ORAL_TABLET | Freq: Every day | ORAL | Status: DC | PRN
  Administered 2019-04-26: 650 mg via ORAL
  Filled 2019-04-22: qty 2

## 2019-04-22 MED ORDER — HYDROCODONE-ACETAMINOPHEN 5-325 MG PO TABS: 1.0000 | ORAL_TABLET | ORAL | Status: DC | PRN

## 2019-04-22 MED ORDER — ROSUVASTATIN CALCIUM 10 MG PO TABS
10.0000 mg | ORAL_TABLET | Freq: Every day | ORAL | Status: DC
  Administered 2019-04-23 – 2019-05-04 (×11): 10 mg via ORAL
  Filled 2019-04-22 (×6): qty 1
  Filled 2019-04-22: qty 2
  Filled 2019-04-22 (×5): qty 1

## 2019-04-22 MED ORDER — ENOXAPARIN SODIUM 80 MG/0.8ML ~~LOC~~ SOLN
1.0000 mg/kg | Freq: Two times a day (BID) | SUBCUTANEOUS | Status: DC
  Administered 2019-04-22 – 2019-04-23 (×3): 80 mg via SUBCUTANEOUS
  Filled 2019-04-22 (×4): qty 0.8

## 2019-04-22 MED ORDER — FENTANYL CITRATE (PF) 100 MCG/2ML IJ SOLN
50.0000 ug | INTRAMUSCULAR | Status: DC | PRN
  Administered 2019-04-22 (×2): 50 ug via INTRAVENOUS
  Filled 2019-04-22 (×3): qty 2

## 2019-04-22 MED ORDER — HYDROCODONE-ACETAMINOPHEN 5-325 MG PO TABS
1.0000 | ORAL_TABLET | Freq: Four times a day (QID) | ORAL | Status: DC | PRN
  Administered 2019-04-23: 15:00:00 2 via ORAL
  Administered 2019-04-23: 1 via ORAL
  Filled 2019-04-22: qty 2
  Filled 2019-04-22: qty 1

## 2019-04-22 NOTE — ED Triage Notes (Signed)
Pt brought in by [facility] from home for a fall with left hip pain. Pt fell while he was in his garage, was able to crawl inside to call for help. Pt left leg shortened and externally rotated. Pt had left hip replacement x10 years ago. Pt states he felt 'a pop" in the middle of his left thigh when he fell. Swelling and bruising noted to that area. Per EMS pt pelvis tender on the left side to palpation. Pt A+Ox4, given 176mcg fentanyl an 4mg  zofran PTA. Pt is on eliquis.

## 2019-04-22 NOTE — ED Notes (Signed)
ED TO INPATIENT HANDOFF REPORT  ED Nurse Name and Phone #: William Hamburger, RN [phone]  S Name/Age/Gender George [name] 85 y.o. male Room/Bed: 009C/009C  Code Status   Code Status: Full Code  Home/SNF/Other Home Patient oriented to: self, place, time and situation Is this baseline? No   Triage Complete: Triage complete  Chief Complaint Hip fracture ([place]) [S72.009A]  Triage Note Pt brought in by [facility] from home for a fall with left hip pain. Pt fell while he was in his garage, was able to crawl inside to call for help. Pt left leg shortened and externally rotated. Pt had left hip replacement x10 years ago. Pt states he felt 'a pop" in the middle of his left thigh when he fell. Swelling and bruising noted to that area. Per EMS pt pelvis tender on the left side to palpation. Pt A+Ox4, given 164mcg fentanyl an 4mg  zofran PTA. Pt is on eliquis.     Allergies Allergies  Allergen Reactions  . Aspirin Other (See Comments)    bleeding    Level of Care/Admitting Diagnosis ED Disposition    ED Disposition Condition Comment   Admit  Hospital Area: [place] [[id number]]  Level of Care: Telemetry [5]  Admit to tele based on following criteria: Monitor for Ischemic changes  Covid Evaluation: Asymptomatic Screening Protocol (No Symptoms)  [facility]: [id number]  Admitting Physician: Rise Patience [phone]  Attending Physician: Rise Patience [phone]  Estimated length of stay: past midnight tomorrow  Certification:: I certify this patient will need inpatient services for at least 2 midnights       B Medical/Surgery History Past Medical History:  Diagnosis Date  . BPH (benign prostatic hypertrophy)   . Chronic atrial fibrillation (HCC)    CHA2DS2VASC score 3    . Esophageal ring   . GERD (gastroesophageal reflux disease)   . History of GI bleed   . Hypercholesteremia   . Osteoarthritis   . Osteoporosis   . Peripheral neuropathy   .  Peripheral neuropathy   . Prostate cancer ([place])    treated with observation  . Trigger finger of both hands   . Vertigo   . Vitamin D deficiency    Past Surgical History:  Procedure Laterality Date  . BALLOON DILATION N/A 08/10/2013   Procedure: BALLOON DILATION;  Surgeon: Arta Silence, MD;  [facility];  Service: Endoscopy;  Laterality: N/A;  . BOTOX INJECTION N/A 08/10/2013   Procedure: BOTOX INJECTION;  Surgeon: Arta Silence, MD;  [facility];  Service: Endoscopy;  Laterality: N/A;  + or -      balloon/botox  . CARDIAC CATHETERIZATION    . CHOLECYSTECTOMY  2010  . ESOPHAGOGASTRODUODENOSCOPY (EGD) WITH PROPOFOL N/A 08/10/2013   Procedure: ESOPHAGOGASTRODUODENOSCOPY (EGD) WITH PROPOFOL;  Surgeon: Arta Silence, MD;  [facility];  Service: Endoscopy;  Laterality: N/A;  . INGUINAL HERNIA REPAIR    . KNEE ARTHROCENTESIS    . skin cancer removed from face  1 week ago   area healing well, some areas reved in past also  . TOTAL HIP ARTHROPLASTY Bilateral yrs ago   hips     A IV Location/Drains/Wounds Patient Lines/Drains/Airways Status   Active Line/Drains/Airways    Name:   Placement date:   Placement time:   Site:   Days:   Peripheral IV 04/22/19 Left Forearm   04/22/19    --    Forearm   less than 1   External Urinary Catheter  04/22/19    2200    --   less than 1          Intake/Output Last 24 hours No intake or output data in the 24 hours ending 04/22/19 2306  Labs/Imaging Results for orders placed or performed during the hospital encounter of 04/22/19 (from the past 48 hour(s))  Basic metabolic panel     Status: Abnormal   Collection Time: 04/22/19  8:38 PM  Result Value Ref Range   Sodium 140 135 - 145 mmol/L   Potassium 4.8 3.5 - 5.1 mmol/L   Chloride 106 98 - 111 mmol/L   CO2 22 22 - 32 mmol/L   Glucose, Bld 174 (H) 70 - 99 mg/dL   BUN 19 8 - 23 mg/dL   Creatinine, Ser 1.10 0.61 - 1.24 mg/dL   Calcium 8.7 (L) 8.9 - 10.3 mg/dL    GFR calc non Af Amer 58 (L) >60 mL/min   GFR calc Af Amer >60 >60 mL/min   Anion gap 12 5 - 15    Comment: Performed at [facility], [place] [address], [place], [place] [address]  CBC WITH DIFFERENTIAL     Status: Abnormal   Collection Time: 04/22/19  8:38 PM  Result Value Ref Range   WBC 24.0 (H) 4.0 - 10.5 K/uL   RBC 3.91 (L) 4.22 - 5.81 MIL/uL   Hemoglobin 13.4 13.0 - 17.0 g/dL   HCT 41.8 39.0 - 52.0 %   MCV 106.9 (H) 80.0 - 100.0 fL   MCH 34.3 (H) 26.0 - 34.0 pg   MCHC 32.1 30.0 - 36.0 g/dL   RDW 14.4 11.5 - 15.5 %   Platelets 162 150 - 400 K/uL   nRBC 0.0 0.0 - 0.2 %   Neutrophils Relative % 87 %   Neutro Abs 21.1 (H) 1.7 - 7.7 K/uL   Lymphocytes Relative 4 %   Lymphs Abs 0.9 0.7 - 4.0 K/uL   Monocytes Relative 8 %   Monocytes Absolute 1.8 (H) 0.1 - 1.0 K/uL   Eosinophils Relative 0 %   Eosinophils Absolute 0.0 0.0 - 0.5 K/uL   Basophils Relative 0 %   Basophils Absolute 0.0 0.0 - 0.1 K/uL   Immature Granulocytes 1 %   Abs Immature Granulocytes 0.17 (H) 0.00 - 0.07 K/uL    Comment: Performed at [facility], [address] [place], [place] [address]  Protime-INR     Status: None   Collection Time: 04/22/19  8:38 PM  Result Value Ref Range   Prothrombin Time 14.0 11.4 - 15.2 seconds   INR 1.1 0.8 - 1.2    Comment: (NOTE) INR goal varies based on device and disease states. Performed at [facility], [place] [address] [place], [place] [address]   Type and screen [place]     Status: None   Collection Time: 04/22/19  8:38 PM  Result Value Ref Range   ABO/RH(D) O POS    Antibody Screen NEG    Sample Expiration      04/25/2019,2359 Performed at [facility], [place] [address], [place], [place] [address]   ABO/Rh     Status: None (Preliminary result)   Collection Time: 04/22/19  8:38 PM  Result Value Ref Range   ABO/RH(D)      O POS Performed at [place] [address], [place], [place] [address]    DG Chest 1  View  Result Date: 04/22/2019  CLINICAL DATA:  Pain status post fall EXAM: CHEST  1 VIEW COMPARISON:  12/14/2014 FINDINGS: The lung volumes are low. Bibasilar atelectasis is noted, greatest at the left lung base. The heart size is enlarged. There is no pneumothorax or large pleural effusion. There is no obvious acute osseous abnormality. There are advanced degenerative changes of both glenohumeral joints. There is mild height loss of several lower thoracic vertebral bodies which appears stable from prior study. IMPRESSION: 1. Low lung volumes with bibasilar atelectasis, greatest at the left lung base. 2. Cardiomegaly. 3. Advanced degenerative changes of both glenohumeral joints. Electronically Signed   By: Constance Holster M.D.   On: 04/22/2019 20:27   DG Hip Unilat With Pelvis 2-3 Views Left  Result Date: 04/22/2019 CLINICAL DATA:  Pain status post fall EXAM: DG HIP (WITH OR WITHOUT PELVIS) 2-3V LEFT COMPARISON:  November 06, 2017 FINDINGS: The patient is status post total hip arthroplasty bilaterally. The hardware appears grossly intact. There is diffuse osteopenia. Again noted is a significantly displaced periprosthetic fracture involving the left femoral diaphysis. IMPRESSION: 1. Significantly displaced periprosthetic fracture involving the left femoral diaphysis. 2. Diffuse osteopenia. 3. Status post bilateral total hip arthroplasty. Electronically Signed   By: Constance Holster M.D.   On: 04/22/2019 20:29   DG Femur Portable 1 View Left  Result Date: 04/22/2019 CLINICAL DATA:  Femur fracture, postreduction. EXAM: LEFT FEMUR PORTABLE 1 VIEW COMPARISON:  Radiograph earlier this day. FINDINGS: Femoral shaft fracture in slightly improved alignment after immobilizer placement. Persistent osseous overriding of 5.8 cm. Persistent apex medial angulation. IMPRESSION: Slight improvement in alignment of the femoral shaft fracture after immobilizer placement. Persistent osseous overriding of 5.8 cm.  Electronically Signed   By: Keith Rake M.D.   On: 04/22/2019 22:02   DG FEMUR MIN 2 VIEWS LEFT  Result Date: 04/22/2019 CLINICAL DATA:  Pain status post fall EXAM: LEFT FEMUR 2 VIEWS COMPARISON:  November 06, 2017 FINDINGS: There is an acute significantly displaced periprosthetic fracture involving the left femoral diaphysis. There is still overlap of the fracture fragments of nearly 9 cm. There is surrounding soft tissue swelling. The patient is status post total hip arthroplasty on the left. There is osteopenia. There are advanced degenerative changes of the left knee. Vascular calcifications are noted. IMPRESSION: 1. Acute significantly displaced periprosthetic fracture of the left femoral diaphysis. 2. Osteopenia. 3. Advanced degenerative changes of the left knee. Electronically Signed   By: Constance Holster M.D.   On: 04/22/2019 20:28    Pending Labs Unresulted Labs (From admission, onward)    Start     Ordered   04/23/19 0500  CBC  Every 72 hours,   R (with STAT occurrences)     04/22/19 2255   04/22/19 2114  Urinalysis, Routine w reflex microscopic  ONCE - STAT,   STAT     04/22/19 2113   04/22/19 1942  SARS CORONAVIRUS 2 (TAT 6-24 HRS) Nasopharyngeal Nasopharyngeal Swab  (Tier 3 (TAT 6-24 hrs))  Once,   STAT    Question Answer Comment  Is this test for diagnosis or screening Screening   Symptomatic for COVID-19 as defined by CDC No   Hospitalized for COVID-19 No   Admitted to ICU for COVID-19 No   Previously tested for COVID-19 Yes   Resident in a congregate (group) care setting No   Employed in healthcare setting No      04/22/19 1941          Vitals/Pain Today's Vitals   04/22/19  2145 04/22/19 2149 04/22/19 2200 04/22/19 2304  BP: 133/80  126/90 128/75  Pulse: 90  93 100  Resp: 18  19 (!) 21  Temp:      TempSrc:      SpO2: 98%  96% 98%  Weight:   79.4 kg   Height:   5\' 10"  (1.778 m)   PainSc:  4       Isolation Precautions No active  isolations  Medications Medications  acetaminophen (TYLENOL) tablet 650 mg (has no administration in time range)  oxyCODONE (Oxy IR/ROXICODONE) immediate release tablet 5-10 mg (has no administration in time range)  HYDROmorphone (DILAUDID) injection 0.5-1 mg (has no administration in time range)  rosuvastatin (CRESTOR) tablet 10 mg (has no administration in time range)  nitroGLYCERIN (NITROSTAT) SL tablet 0.4 mg (has no administration in time range)  HYDROcodone-acetaminophen (NORCO/VICODIN) 5-325 MG per tablet 1-2 tablet (has no administration in time range)  morphine 2 MG/ML injection 0.5 mg (has no administration in time range)  enoxaparin (LOVENOX) injection 80 mg (has no administration in time range)    Mobility non-ambulatory High fall risk   Focused Assessments Musculoskeletal   R Recommendations: See Admitting Provider Note  Report given to:   Additional Notes:  LLE immobilizer applied.

## 2019-04-22 NOTE — Consult Note (Signed)
Reason for ConsultPeriproshetic Fracture Left Femur. Referring Physician: DR.Butler  George [name] is an 86 y.o. male.  HPI: Patient twisted his left Hip in his garage today and sustained a Periprosthetic Fracture of his Left Femur  Past Medical History:  Diagnosis Date  . BPH (benign prostatic hypertrophy)   . Chronic atrial fibrillation (HCC)    CHA2DS2VASC score 3    . Esophageal ring   . GERD (gastroesophageal reflux disease)   . History of GI bleed   . Hypercholesteremia   . Osteoarthritis   . Osteoporosis   . Peripheral neuropathy   . Peripheral neuropathy   . Prostate cancer ([place])    treated with observation  . Trigger finger of both hands   . Vertigo   . Vitamin D deficiency     Past Surgical History:  Procedure Laterality Date  . BALLOON DILATION N/A 08/10/2013   Procedure: BALLOON DILATION;  Surgeon: Arta Silence, MD;  [facility];  Service: Endoscopy;  Laterality: N/A;  . BOTOX INJECTION N/A 08/10/2013   Procedure: BOTOX INJECTION;  Surgeon: Arta Silence, MD;  [facility];  Service: Endoscopy;  Laterality: N/A;  + or -      balloon/botox  . CARDIAC CATHETERIZATION    . CHOLECYSTECTOMY  2010  . ESOPHAGOGASTRODUODENOSCOPY (EGD) WITH PROPOFOL N/A 08/10/2013   Procedure: ESOPHAGOGASTRODUODENOSCOPY (EGD) WITH PROPOFOL;  Surgeon: Arta Silence, MD;  [facility];  Service: Endoscopy;  Laterality: N/A;  . INGUINAL HERNIA REPAIR    . KNEE ARTHROCENTESIS    . skin cancer removed from face  1 week ago   area healing well, some areas reved in past also  . TOTAL HIP ARTHROPLASTY Bilateral yrs ago   hips    Family History  Problem Relation Age of Onset  . Cancer - Other Father     Social History:  reports that he has never smoked. He has never used smokeless tobacco. He reports that he does not drink alcohol or use drugs.  Allergies:  Allergies  Allergen Reactions  . Aspirin Other (See Comments)    bleeding    Medications: I  have reviewed the patient's current medications.  Results for orders placed or performed during the hospital encounter of 04/22/19 (from the past 48 hour(s))  Basic metabolic panel     Status: Abnormal   Collection Time: 04/22/19  8:38 PM  Result Value Ref Range   Sodium 140 135 - 145 mmol/L   Potassium 4.8 3.5 - 5.1 mmol/L   Chloride 106 98 - 111 mmol/L   CO2 22 22 - 32 mmol/L   Glucose, Bld 174 (H) 70 - 99 mg/dL   BUN 19 8 - 23 mg/dL   Creatinine, Ser 1.10 0.61 - 1.24 mg/dL   Calcium 8.7 (L) 8.9 - 10.3 mg/dL   GFR calc non Af Amer 58 (L) >60 mL/min   GFR calc Af Amer >60 >60 mL/min   Anion gap 12 5 - 15    Comment: Performed at [facility], [place] [address], [place], [place] [address]  CBC WITH DIFFERENTIAL     Status: Abnormal   Collection Time: 04/22/19  8:38 PM  Result Value Ref Range   WBC 24.0 (H) 4.0 - 10.5 K/uL   RBC 3.91 (L) 4.22 - 5.81 MIL/uL   Hemoglobin 13.4 13.0 - 17.0 g/dL   HCT 41.8 39.0 - 52.0 %   MCV 106.9 (H) 80.0 - 100.0 fL   MCH 34.3 (H) 26.0 - 34.0 pg  MCHC 32.1 30.0 - 36.0 g/dL   RDW 14.4 11.5 - 15.5 %   Platelets 162 150 - 400 K/uL   nRBC 0.0 0.0 - 0.2 %   Neutrophils Relative % 87 %   Neutro Abs 21.1 (H) 1.7 - 7.7 K/uL   Lymphocytes Relative 4 %   Lymphs Abs 0.9 0.7 - 4.0 K/uL   Monocytes Relative 8 %   Monocytes Absolute 1.8 (H) 0.1 - 1.0 K/uL   Eosinophils Relative 0 %   Eosinophils Absolute 0.0 0.0 - 0.5 K/uL   Basophils Relative 0 %   Basophils Absolute 0.0 0.0 - 0.1 K/uL   Immature Granulocytes 1 %   Abs Immature Granulocytes 0.17 (H) 0.00 - 0.07 K/uL    Comment: Performed at [place] [address] [place], [place] [address]  Protime-INR     Status: None   Collection Time: 04/22/19  8:38 PM  Result Value Ref Range   Prothrombin Time 14.0 11.4 - 15.2 seconds   INR 1.1 0.8 - 1.2    Comment: (NOTE) INR goal varies based on device and disease states. Performed at [facility], [place] [address], [place],  [place] [address]   Type and screen [place]     Status: None   Collection Time: 04/22/19  8:38 PM  Result Value Ref Range   ABO/RH(D) O POS    Antibody Screen NEG    Sample Expiration      04/25/2019,2359 Performed at [facility], [place] [address], [place], [place] [address]   ABO/Rh     Status: None (Preliminary result)   Collection Time: 04/22/19  8:38 PM  Result Value Ref Range   ABO/RH(D)      O POS Performed at [facility] [address] [place], [place] [address]     DG Chest 1 View  Result Date: 04/22/2019 CLINICAL DATA:  Pain status post fall EXAM: CHEST  1 VIEW COMPARISON:  12/14/2014 FINDINGS: The lung volumes are low. Bibasilar atelectasis is noted, greatest at the left lung base. The heart size is enlarged. There is no pneumothorax or large pleural effusion. There is no obvious acute osseous abnormality. There are advanced degenerative changes of both glenohumeral joints. There is mild height loss of several lower thoracic vertebral bodies which appears stable from prior study. IMPRESSION: 1. Low lung volumes with bibasilar atelectasis, greatest at the left lung base. 2. Cardiomegaly. 3. Advanced degenerative changes of both glenohumeral joints. Electronically Signed   By: Constance Holster M.D.   On: 04/22/2019 20:27   DG Hip Unilat With Pelvis 2-3 Views Left  Result Date: 04/22/2019 CLINICAL DATA:  Pain status post fall EXAM: DG HIP (WITH OR WITHOUT PELVIS) 2-3V LEFT COMPARISON:  November 06, 2017 FINDINGS: The patient is status post total hip arthroplasty bilaterally. The hardware appears grossly intact. There is diffuse osteopenia. Again noted is a significantly displaced periprosthetic fracture involving the left femoral diaphysis. IMPRESSION: 1. Significantly displaced periprosthetic fracture involving the left femoral diaphysis. 2. Diffuse osteopenia. 3. Status post bilateral total hip arthroplasty. Electronically Signed   By: Constance Holster  M.D.   On: 04/22/2019 20:29   DG FEMUR MIN 2 VIEWS LEFT  Result Date: 04/22/2019 CLINICAL DATA:  Pain status post fall EXAM: LEFT FEMUR 2 VIEWS COMPARISON:  November 06, 2017 FINDINGS: There is an acute significantly displaced periprosthetic fracture involving the left femoral diaphysis. There is still overlap of the fracture fragments of nearly  9 cm. There is surrounding soft tissue swelling. The patient is status post total hip arthroplasty on the left. There is osteopenia. There are advanced degenerative changes of the left knee. Vascular calcifications are noted. IMPRESSION: 1. Acute significantly displaced periprosthetic fracture of the left femoral diaphysis. 2. Osteopenia. 3. Advanced degenerative changes of the left knee. Electronically Signed   By: Constance Holster M.D.   On: 04/22/2019 20:28    Review of Systems  Constitutional: Negative.   HENT: Negative.   Eyes: Negative.   Respiratory: Negative.   Cardiovascular: Negative.   Endocrine: Negative.   Genitourinary: Negative.   Musculoskeletal: Positive for gait problem and joint swelling.  Skin: Negative.   Allergic/Immunologic: Negative.   Hematological: Negative.   Psychiatric/Behavioral: Negative.    Blood pressure 129/70, pulse 94, temperature 97.8 F (36.6 C), temperature source Oral, resp. rate (!) 21, SpO2 97 %. Physical Exam  Constitutional: He appears distressed.  HENT:  Head: Normocephalic.  Eyes: Pupils are equal, round, and reactive to light.  Cardiovascular:  Arythemia  Musculoskeletal:        General: Deformity present.     Cervical back: Normal range of motion.  Neurological: He is alert.  Skin: Skin is warm.  Psychiatric: He has a normal mood and affect.    Assessment/Plan: Admit and transfer to [facility] for Open Reduction and Internal Fixation of Left Periprosthetic Fracture by Dr. Wynelle Link.  Latanya Maudlin 04/22/2019, 9:38 PM

## 2019-04-22 NOTE — ED Provider Notes (Signed)
[facility] EMERGENCY DEPARTMENT Provider Note   CSN: [id number] Arrival date & time: 04/22/19  1858     History Chief Complaint  Patient presents with  . Fall  . Hip Pain    PJ BARTSCHI is a 85 y.o. male.  He has a history of bilateral hip prosthesis.  He said he was in the garage at home when he slipped and heard a crack in his left thigh.  He did not strike his head or neck and did not lose consciousness.  He is complaining of moderate to severe pain in his left thigh.  No new numbness or weakness, has neuropathy.  No chest pain or shortness of breath.  He is on anticoagulation.  He was given fentanyl and immobilized in position of comfort for his left leg.  The history is provided by the patient.  Fall This is a new problem. The current episode started 1 to 2 hours ago. The problem occurs constantly. The problem has not changed since onset.Pertinent negatives include no chest pain, no abdominal pain, no headaches and no shortness of breath. The symptoms are aggravated by bending. Nothing relieves the symptoms. He has tried nothing for the symptoms. The treatment provided no relief.  Hip Pain This is a new problem. The current episode started 1 to 2 hours ago. The problem occurs constantly. The problem has not changed since onset.Pertinent negatives include no chest pain, no abdominal pain, no headaches and no shortness of breath. The symptoms are aggravated by bending. He has tried nothing for the symptoms. The treatment provided no relief.       Past Medical History:  Diagnosis Date  . BPH (benign prostatic hypertrophy)   . Chronic atrial fibrillation (HCC)    CHA2DS2VASC score 3    . Esophageal ring   . GERD (gastroesophageal reflux disease)   . History of GI bleed   . Hypercholesteremia   . Osteoarthritis   . Osteoporosis   . Peripheral neuropathy   . Peripheral neuropathy   . Prostate cancer ([place])    treated with observation  . Trigger finger  of both hands   . Vertigo   . Vitamin D deficiency     Patient Active Problem List   Diagnosis Date Noted  . Bilateral knee pain 02/24/2019  . Current use of long term anticoagulation 01/06/2019  . Non-occlusive coronary artery disease 01/06/2019  . Pure hypercholesterolemia 01/06/2019  . Primary osteoarthritis of both knees 06/17/2016  . Esophageal ring 12/14/2014  . Chronic atrial fibrillation ([place]) 12/14/2014  . Chest pain 12/14/2014  . HLD (hyperlipidemia) 12/14/2014  . History of GI bleed 12/14/2014  . Peripheral neuropathy w/gait disturbance 12/14/2014  . Pain in the chest   . Paroxysmal atrial fibrillation ([place])   . Gastroesophageal reflux disease without esophagitis     Past Surgical History:  Procedure Laterality Date  . BALLOON DILATION N/A 08/10/2013   Procedure: BALLOON DILATION;  Surgeon: Arta Silence, MD;  Location: [facility];  Service: Endoscopy;  Laterality: N/A;  . BOTOX INJECTION N/A 08/10/2013   Procedure: BOTOX INJECTION;  Surgeon: Arta Silence, MD;  Location: [facility];  Service: Endoscopy;  Laterality: N/A;  + or -      balloon/botox  . CARDIAC CATHETERIZATION    . CHOLECYSTECTOMY  2010  . ESOPHAGOGASTRODUODENOSCOPY (EGD) WITH PROPOFOL N/A 08/10/2013   Procedure: ESOPHAGOGASTRODUODENOSCOPY (EGD) WITH PROPOFOL;  Surgeon: Arta Silence, MD;  Location: [facility];  Service: Endoscopy;  Laterality: N/A;  . INGUINAL HERNIA  REPAIR    . KNEE ARTHROCENTESIS    . skin cancer removed from face  1 week ago   area healing well, some areas reved in past also  . TOTAL HIP ARTHROPLASTY Bilateral yrs ago   hips       Family History  Problem Relation Age of Onset  . Cancer - Other Father     Social History   Tobacco Use  . Smoking status: Never Smoker  . Smokeless tobacco: Never Used  Substance Use Topics  . Alcohol use: No  . Drug use: No    Home Medications Prior to Admission medications   Medication Sig Start Date End Date Taking? Authorizing  Provider  apixaban (ELIQUIS) 5 MG TABS tablet Take 1 tablet (5 mg total) by mouth 2 (two) times daily. 08/12/13   Arta Silence, MD  COLCRYS 0.6 MG tablet  01/03/18   [provider]  diazepam (VALIUM) 2 MG tablet Take 1 tablet (2 mg total) by mouth every 8 (eight) hours as needed for muscle spasms. 11/06/17   Isla Pence, MD  fluorouracil (EFUDEX) 5 % cream APPLY TO AFFECTED AREAS NIGHTLY FOR 2 WEEKS 03/16/19   [provider]  HYDROcodone-acetaminophen (NORCO/VICODIN) 5-325 MG tablet Take 1 tablet by mouth every 4 (four) hours as needed. 11/06/17   Isla Pence, MD  nitroGLYCERIN (NITROSTAT) 0.4 MG SL tablet Place 1 tablet (0.4 mg total) under the tongue every 5 (five) minutes as needed for chest pain. 12/15/14   Rai, Vernelle Emerald, MD  NONFORMULARY OR COMPOUNDED ITEM [facility]:  Antifungal Cream - Terbinafine 3%, Fluconazole 2%, Tea Tree Oil 5%, Urea 10%, Ibuprofen 2%, in DMSO #71ml suspension, apply to affected toenail(s) once at bedtime. 09/24/18   Marzetta Board, DPM  pantoprazole (PROTONIX) 40 MG tablet Take 1 tablet (40 mg total) by mouth daily. 12/15/14   Rai, Ripudeep K, MD  rosuvastatin (CRESTOR) 10 MG tablet Take 10 mg by mouth every other day.     [provider]    Allergies    Aspirin  Review of Systems   Review of Systems  Constitutional: Negative for fever.  HENT: Negative for sore throat.   Eyes: Negative for visual disturbance.  Respiratory: Negative for shortness of breath.   Cardiovascular: Negative for chest pain.  Gastrointestinal: Negative for abdominal pain.  Genitourinary: Negative for dysuria.  Musculoskeletal: Negative for neck pain.  Skin: Negative for rash.  Neurological: Negative for headaches.    Physical Exam Updated Vital Signs BP 126/90   Pulse 93   Temp 97.8 F (36.6 C) (Oral)   Resp 19   Ht 5\' 10"  (1.778 m)   Wt 79.4 kg   SpO2 96%   BMI 25.11 kg/m   Physical Exam Vitals and nursing note reviewed.    Constitutional:      Appearance: He is well-developed.  HENT:     Head: Normocephalic and atraumatic.  Eyes:     Conjunctiva/sclera: Conjunctivae normal.  Cardiovascular:     Rate and Rhythm: Normal rate and regular rhythm.     Pulses: Normal pulses.     Heart sounds: No murmur.  Pulmonary:     Effort: Pulmonary effort is normal. No respiratory distress.     Breath sounds: Normal breath sounds.  Abdominal:     Palpations: Abdomen is soft.     Tenderness: There is no abdominal tenderness.  Musculoskeletal:        General: Tenderness, deformity and signs of injury present.  Cervical back: Neck supple.     Right lower leg: No edema.     Left lower leg: No edema.     Comments: Patient has full range of motion of his upper extremities without any pain or limitation.  Right lower extremity full range of motion without any pain or limitation.  Left lower extremity splinted in external rotation and abduction.  Tenderness throughout the medial thigh.  Distal motor intact.  Cervical spine no midline tenderness and full range of motion without any pain or limitations.  Skin:    General: Skin is warm and dry.     Capillary Refill: Capillary refill takes less than 2 seconds.  Neurological:     General: No focal deficit present.     Mental Status: He is alert.     Motor: No weakness.     ED Results / Procedures / Treatments   Labs (all labs ordered are listed, but only abnormal results are displayed) Labs Reviewed  BASIC METABOLIC PANEL - Abnormal; Notable for the following components:      Result Value   Glucose, Bld 174 (*)    Calcium 8.7 (*)    GFR calc non Af Amer 58 (*)    All other components within normal limits  CBC WITH DIFFERENTIAL/PLATELET - Abnormal; Notable for the following components:   WBC 24.0 (*)    RBC 3.91 (*)    MCV 106.9 (*)    MCH 34.3 (*)    Neutro Abs 21.1 (*)    Monocytes Absolute 1.8 (*)    Abs Immature Granulocytes 0.17 (*)    All other components  within normal limits  SARS CORONAVIRUS 2 (TAT 6-24 HRS)  PROTIME-INR  URINALYSIS, ROUTINE W REFLEX MICROSCOPIC  CBC  TYPE AND SCREEN  ABO/RH    EKG EKG Interpretation  Date/Time:  Friday April 22 2019 21:07:35 EST Ventricular Rate:  92 PR Interval:    QRS Duration: 107 QT Interval:  359 QTC Calculation: 445 R Axis:   -57 Text Interpretation: Atrial fibrillation Left axis deviation RSR' in V1 or V2, probably normal variant similar to prior 8/16 Confirmed by Aletta Edouard [phone]) on 04/22/2019 9:20:35 PM   Radiology DG Chest 1 View  Result Date: 04/22/2019 CLINICAL DATA:  Pain status post fall EXAM: CHEST  1 VIEW COMPARISON:  12/14/2014 FINDINGS: The lung volumes are low. Bibasilar atelectasis is noted, greatest at the left lung base. The heart size is enlarged. There is no pneumothorax or large pleural effusion. There is no obvious acute osseous abnormality. There are advanced degenerative changes of both glenohumeral joints. There is mild height loss of several lower thoracic vertebral bodies which appears stable from prior study. IMPRESSION: 1. Low lung volumes with bibasilar atelectasis, greatest at the left lung base. 2. Cardiomegaly. 3. Advanced degenerative changes of both glenohumeral joints. Electronically Signed   By: Constance Holster M.D.   On: 04/22/2019 20:27   DG Hip Unilat With Pelvis 2-3 Views Left  Result Date: 04/22/2019 CLINICAL DATA:  Pain status post fall EXAM: DG HIP (WITH OR WITHOUT PELVIS) 2-3V LEFT COMPARISON:  November 06, 2017 FINDINGS: The patient is status post total hip arthroplasty bilaterally. The hardware appears grossly intact. There is diffuse osteopenia. Again noted is a significantly displaced periprosthetic fracture involving the left femoral diaphysis. IMPRESSION: 1. Significantly displaced periprosthetic fracture involving the left femoral diaphysis. 2. Diffuse osteopenia. 3. Status post bilateral total hip arthroplasty. Electronically Signed    By: Constance Holster M.D.   On:  04/22/2019 20:29   DG Femur Portable 1 View Left  Result Date: 04/22/2019 CLINICAL DATA:  Femur fracture, postreduction. EXAM: LEFT FEMUR PORTABLE 1 VIEW COMPARISON:  Radiograph earlier this day. FINDINGS: Femoral shaft fracture in slightly improved alignment after immobilizer placement. Persistent osseous overriding of 5.8 cm. Persistent apex medial angulation. IMPRESSION: Slight improvement in alignment of the femoral shaft fracture after immobilizer placement. Persistent osseous overriding of 5.8 cm. Electronically Signed   By: Keith Rake M.D.   On: 04/22/2019 22:02   DG FEMUR MIN 2 VIEWS LEFT  Result Date: 04/22/2019 CLINICAL DATA:  Pain status post fall EXAM: LEFT FEMUR 2 VIEWS COMPARISON:  November 06, 2017 FINDINGS: There is an acute significantly displaced periprosthetic fracture involving the left femoral diaphysis. There is still overlap of the fracture fragments of nearly 9 cm. There is surrounding soft tissue swelling. The patient is status post total hip arthroplasty on the left. There is osteopenia. There are advanced degenerative changes of the left knee. Vascular calcifications are noted. IMPRESSION: 1. Acute significantly displaced periprosthetic fracture of the left femoral diaphysis. 2. Osteopenia. 3. Advanced degenerative changes of the left knee. Electronically Signed   By: Constance Holster M.D.   On: 04/22/2019 20:28    Procedures .Ortho Injury Treatment  Date/Time: 04/22/2019 11:02 PM Performed by: Hayden Rasmussen, MD Authorized by: Hayden Rasmussen, MD   Consent:    Consent obtained:  Verbal   Consent given by:  Patient   Risks discussed:  Nerve damage and vascular damage   Alternatives discussed:  Delayed treatmentInjury location: upper leg Location details: left upper leg Injury type: fracture Pre-procedure neurovascular assessment: neurovascularly intact Pre-procedure distal perfusion: normal Pre-procedure neurological  function: normal Pre-procedure range of motion: reduced  Anesthesia: Local anesthesia used: no  Patient sedated: NoManipulation performed: no Immobilization: knee immobilizer. Post-procedure neurovascular assessment: post-procedure neurovascularly intact Post-procedure distal perfusion: normal Post-procedure neurological function: normal Post-procedure range of motion: unchanged Patient tolerance: patient tolerated the procedure well with no immediate complications    (including critical care time)  Medications Ordered in ED Medications  acetaminophen (TYLENOL) tablet 650 mg (has no administration in time range)  oxyCODONE (Oxy IR/ROXICODONE) immediate release tablet 5-10 mg (has no administration in time range)  HYDROmorphone (DILAUDID) injection 0.5-1 mg (has no administration in time range)  rosuvastatin (CRESTOR) tablet 10 mg (has no administration in time range)  nitroGLYCERIN (NITROSTAT) SL tablet 0.4 mg (has no administration in time range)  HYDROcodone-acetaminophen (NORCO/VICODIN) 5-325 MG per tablet 1-2 tablet (has no administration in time range)  morphine 2 MG/ML injection 0.5 mg (has no administration in time range)  enoxaparin (LOVENOX) injection 80 mg (has no administration in time range)    ED Course  I have reviewed the triage vital signs and the nursing notes.  Pertinent labs & imaging results that were available during my care of the patient were reviewed by me and considered in my medical decision making (see chart for details).  Clinical Course as of Apr 21 2301  Fri Apr 22, 2019  1941 Discussed with Dr. Gladstone Lighter orthopedic consultant.  He said he would evaluate the patient in the ED and put a consult down but would like the patient admitted to the medical service.   [MB]  2022 Reviewed patient's initial imaging.  He appears to have a periprosthetic fracture.   [MB]  3717 85 year old male status post fall here with acute left thigh pain.  He is  neurovascular intact distal.  Differential includes dislocation, periprosthetic fracture,  vascular injury.   [MB]  2146 Dr. Cay Schillings in for orthopedic consult and we have removed the splint and placed him in a knee immobilizer.  Recommending Lovenox bridge and admission to [facility] for Dr. Sandie Ano surgery anticipated on Monday.  I consulted [facility] Dr. Hal Hope who accepts the patient in transfer for admission.   [MB]    Clinical Course User Index [MB] Hayden Rasmussen, MD   MDM Rules/Calculators/A&P                      Final Clinical Impression(s) / ED Diagnoses Final diagnoses:  Periprosthetic fracture of hip, initial encounter  Fall, initial encounter    Rx / DC Orders ED Discharge Orders    None       Hayden Rasmussen, MD 04/22/19 2304

## 2019-04-22 NOTE — H&P (Signed)
History and Physical    George [name] [id number] DOB: 12/10/1923 DOA: 04/22/2019  PCP: Merrilee Seashore, MD  Patient coming from: Home.  Chief Complaint: Twisted his left hip.  HPI: George [name] is a 85 y.o. male with history of A. fib, hyperlipidemia and prostate cancer and observation presents to the ER after patient started having left hip pain after he twisted it while trying to walk towards his garage at his home.  Patient states this happened after he missed a step in his house.  Did not fall or hit his head did not have any chest pain or shortness of breath.  Patient states last couple of days he has not taken his apixaban.  ED Course: X-rays in the ER showed left periprosthetic femur fracture for which Dr. Gladstone Lighter orthopedic surgeon was consulted.  Labs show leukocytosis with 24 and blood glucose of 174.  EKG shows A. fib rate controlled.  Covid test was negative.  Patient admitted for further management of left periprosthetic fracture.  Review of Systems: As per HPI, rest all negative.   Past Medical History:  Diagnosis Date   BPH (benign prostatic hypertrophy)    Chronic atrial fibrillation (HCC)    CHA2DS2VASC score 3     Esophageal ring    GERD (gastroesophageal reflux disease)    History of GI bleed    Hypercholesteremia    Osteoarthritis    Osteoporosis    Peripheral neuropathy    Peripheral neuropathy    Prostate cancer ([place])    treated with observation   Trigger finger of both hands    Vertigo    Vitamin D deficiency     Past Surgical History:  Procedure Laterality Date   BALLOON DILATION N/A 08/10/2013   Procedure: BALLOON DILATION;  Surgeon: Arta Silence, MD;  Location: [facility];  Service: Endoscopy;  Laterality: N/A;   BOTOX INJECTION N/A 08/10/2013   Procedure: BOTOX INJECTION;  Surgeon: Arta Silence, MD;  Location: [facility];  Service: Endoscopy;  Laterality: N/A;  + or -      balloon/botox   CARDIAC CATHETERIZATION      CHOLECYSTECTOMY  2010   ESOPHAGOGASTRODUODENOSCOPY (EGD) WITH PROPOFOL N/A 08/10/2013   Procedure: ESOPHAGOGASTRODUODENOSCOPY (EGD) WITH PROPOFOL;  Surgeon: Arta Silence, MD;  Location: [facility];  Service: Endoscopy;  Laterality: N/A;   INGUINAL HERNIA REPAIR     KNEE ARTHROCENTESIS     skin cancer removed from face  1 week ago   area healing well, some areas reved in past also   TOTAL HIP ARTHROPLASTY Bilateral yrs ago   hips     reports that he has never smoked. He has never used smokeless tobacco. He reports that he does not drink alcohol or use drugs.  Allergies  Allergen Reactions   Aspirin Other (See Comments)    bleeding    Family History  Problem Relation Age of Onset   Cancer - Other Father     Prior to Admission medications   Medication Sig Start Date End Date Taking? Authorizing Provider  acetaminophen (TYLENOL) 650 MG CR tablet Take 650 mg by mouth daily as needed for pain.   Yes [provider]  apixaban (ELIQUIS) 5 MG TABS tablet Take 1 tablet (5 mg total) by mouth 2 (two) times daily. Patient taking differently: Take 5 mg by mouth daily.  08/12/13  Yes Arta Silence, MD  fluorouracil (EFUDEX) 5 % cream Apply 1 application topically See admin instructions. Apply topically daily as needed for spots on  head - until healed. Repeat when needed. 03/16/19  Yes [provider]  nitroGLYCERIN (NITROSTAT) 0.4 MG SL tablet Place 1 tablet (0.4 mg total) under the tongue every 5 (five) minutes as needed for chest pain. 12/15/14  Yes Rai, Ripudeep K, MD  rosuvastatin (CRESTOR) 10 MG tablet Take 10 mg by mouth daily.    Yes [provider]  diazepam (VALIUM) 2 MG tablet Take 1 tablet (2 mg total) by mouth every 8 (eight) hours as needed for muscle spasms. Patient not taking: Reported on 04/22/2019 11/06/17   Isla Pence, MD  HYDROcodone-acetaminophen (NORCO/VICODIN) 5-325 MG tablet Take 1 tablet by mouth every 4 (four) hours as  needed. Patient not taking: Reported on 04/22/2019 11/06/17   Isla Pence, MD  NONFORMULARY OR COMPOUNDED ITEM [facility]:  Antifungal Cream - Terbinafine 3%, Fluconazole 2%, Tea Tree Oil 5%, Urea 10%, Ibuprofen 2%, in DMSO #64ml suspension, apply to affected toenail(s) once at bedtime. Patient not taking: Reported on 04/22/2019 09/24/18   Marzetta Board, DPM  pantoprazole (PROTONIX) 40 MG tablet Take 1 tablet (40 mg total) by mouth daily. Patient not taking: Reported on 04/22/2019 12/15/14   Mendel Corning, MD    Physical Exam: Constitutional: Moderately built and nourished. Vitals:   04/22/19 2100 04/22/19 2115 04/22/19 2145 04/22/19 2200  BP: 120/89 129/70 133/80 126/90  Pulse: 88 94 90 93  Resp: 15 (!) 21 18 19   Temp:      TempSrc:      SpO2: 97% 97% 98% 96%   Eyes: Anicteric no pallor. ENMT: No discharge from the ears eyes nose or mouth. Neck: No mass felt.  No neck rigidity. Respiratory: No rhonchi or crepitations. Cardiovascular: S1-S2 heard. Abdomen: Soft nontender bowel sound present. Musculoskeletal: Pain on moving left hip. Skin: No rash. Neurologic: Alert awake oriented to time place and person.  Moves all extremities. Psychiatric: Appears normal.   Labs on Admission: I have personally reviewed following labs and imaging studies  CBC: Recent Labs  Lab 04/22/19 2038  WBC 24.0*  NEUTROABS 21.1*  HGB 13.4  HCT 41.8  MCV 106.9*  PLT 0000000   Basic Metabolic Panel: Recent Labs  Lab 04/22/19 2038  NA 140  K 4.8  CL 106  CO2 22  GLUCOSE 174*  BUN 19  CREATININE 1.10  CALCIUM 8.7*   GFR: CrCl cannot be calculated (Unknown ideal weight.). Liver Function Tests: No results for input(s): AST, ALT, ALKPHOS, BILITOT, PROT, ALBUMIN in the last 168 hours. No results for input(s): LIPASE, AMYLASE in the last 168 hours. No results for input(s): AMMONIA in the last 168 hours. Coagulation Profile: Recent Labs  Lab 04/22/19 2038  INR 1.1    Cardiac Enzymes: No results for input(s): CKTOTAL, CKMB, CKMBINDEX, TROPONINI in the last 168 hours. BNP (last 3 results) No results for input(s): PROBNP in the last 8760 hours. HbA1C: No results for input(s): HGBA1C in the last 72 hours. CBG: No results for input(s): GLUCAP in the last 168 hours. Lipid Profile: No results for input(s): CHOL, HDL, LDLCALC, TRIG, CHOLHDL, LDLDIRECT in the last 72 hours. Thyroid Function Tests: No results for input(s): TSH, T4TOTAL, FREET4, T3FREE, THYROIDAB in the last 72 hours. Anemia Panel: No results for input(s): VITAMINB12, FOLATE, FERRITIN, TIBC, IRON, RETICCTPCT in the last 72 hours. Urine analysis: No results found for: COLORURINE, APPEARANCEUR, LABSPEC, PHURINE, GLUCOSEU, HGBUR, BILIRUBINUR, KETONESUR, PROTEINUR, UROBILINOGEN, NITRITE, LEUKOCYTESUR Sepsis Labs: @LABRCNTIP (procalcitonin:4,lacticidven:4) )No results found for this or any previous visit (from the past 240 hour(s)).  Radiological Exams on Admission: DG Chest 1 View  Result Date: 04/22/2019 CLINICAL DATA:  Pain status post fall EXAM: CHEST  1 VIEW COMPARISON:  12/14/2014 FINDINGS: The lung volumes are low. Bibasilar atelectasis is noted, greatest at the left lung base. The heart size is enlarged. There is no pneumothorax or large pleural effusion. There is no obvious acute osseous abnormality. There are advanced degenerative changes of both glenohumeral joints. There is mild height loss of several lower thoracic vertebral bodies which appears stable from prior study. IMPRESSION: 1. Low lung volumes with bibasilar atelectasis, greatest at the left lung base. 2. Cardiomegaly. 3. Advanced degenerative changes of both glenohumeral joints. Electronically Signed   By: Constance Holster M.D.   On: 04/22/2019 20:27   DG Hip Unilat With Pelvis 2-3 Views Left  Result Date: 04/22/2019 CLINICAL DATA:  Pain status post fall EXAM: DG HIP (WITH OR WITHOUT PELVIS) 2-3V LEFT COMPARISON:  November 06, 2017 FINDINGS: The patient is status post total hip arthroplasty bilaterally. The hardware appears grossly intact. There is diffuse osteopenia. Again noted is a significantly displaced periprosthetic fracture involving the left femoral diaphysis. IMPRESSION: 1. Significantly displaced periprosthetic fracture involving the left femoral diaphysis. 2. Diffuse osteopenia. 3. Status post bilateral total hip arthroplasty. Electronically Signed   By: Constance Holster M.D.   On: 04/22/2019 20:29   DG Femur Portable 1 View Left  Result Date: 04/22/2019 CLINICAL DATA:  Femur fracture, postreduction. EXAM: LEFT FEMUR PORTABLE 1 VIEW COMPARISON:  Radiograph earlier this day. FINDINGS: Femoral shaft fracture in slightly improved alignment after immobilizer placement. Persistent osseous overriding of 5.8 cm. Persistent apex medial angulation. IMPRESSION: Slight improvement in alignment of the femoral shaft fracture after immobilizer placement. Persistent osseous overriding of 5.8 cm. Electronically Signed   By: Keith Rake M.D.   On: 04/22/2019 22:02   DG FEMUR MIN 2 VIEWS LEFT  Result Date: 04/22/2019 CLINICAL DATA:  Pain status post fall EXAM: LEFT FEMUR 2 VIEWS COMPARISON:  November 06, 2017 FINDINGS: There is an acute significantly displaced periprosthetic fracture involving the left femoral diaphysis. There is still overlap of the fracture fragments of nearly 9 cm. There is surrounding soft tissue swelling. The patient is status post total hip arthroplasty on the left. There is osteopenia. There are advanced degenerative changes of the left knee. Vascular calcifications are noted. IMPRESSION: 1. Acute significantly displaced periprosthetic fracture of the left femoral diaphysis. 2. Osteopenia. 3. Advanced degenerative changes of the left knee. Electronically Signed   By: Constance Holster M.D.   On: 04/22/2019 20:28    EKG: Independently reviewed.  A. fib rate controlled.  Assessment/Plan Principal  Problem:   Hip fracture (HCC) Active Problems:   Chronic atrial fibrillation (HCC)   HLD (hyperlipidemia)    1. Left periprosthetic hip fracture being followed by orthopedic surgery.  Plan is to do surgery on Monday, December 21.  Orthopedic advised to keep patient on Lovenox bridging and hold apixaban.  Pain relief medications. 2. Leukocytosis cause not clear.  Patient is afebrile chest x-ray UA pending follow CBC. 3. Chronic A. fib rate controlled without any medications.  On Lovenox bridging at this time.  Orthopedics aware. 4. History of prostate cancer under observation. 5. Macrocytosis. 6. Hyperlipidemia on Crestor.  To that patient will need further management for the hip fracture in inpatient status.   DVT prophylaxis: Lovenox A. fib full dose. Code Status: DNR as discussed with patient. Family Communication: Discussed with patient. Disposition Plan: May need rehab. Consults called:  Orthopedics. Admission status: Inpatient.   Rise Patience MD [facility] Pager [phone].  If 7PM-7AM, please contact night-coverage [email or web address] Password [facility]  04/22/2019, 10:39 PM

## 2019-04-22 NOTE — ED Notes (Signed)
megan wallas granddaughter please call for updates at [phone]

## 2019-04-22 NOTE — Progress Notes (Signed)
[place] for Lovenox Indication: atrial fibrillation  Labs: Recent Labs    04/22/19 2038  HGB 13.4  HCT 41.8  PLT 162  LABPROT 14.0  INR 1.1  CREATININE 1.10    Assessment: 65 yom with history of afib on apixaban PTA presenting L femur fracture. Pharmacy consulted to transition to Lovenox. Plan is to transfer patient to [facility] for Ortho intervention. Last dose of apixaban reported 12/18 at 0900 per med rec (but reportedly only takes once daily). CBC wnl. History of GIB noted but no current active bleed issues documented. Patient states he weighs 175 lbs.  Goal of Therapy:  Stroke prevention Monitor platelets by anticoagulation protocol: Yes   Plan:  Lovenox 80 (1mg /kg) [place] q12h Monitor CBC at least q72h, s/sx bleeding F/u Ortho plans   Elicia Lamp, PharmD, BCPS Clinical Pharmacist 04/22/2019 10:43 PM

## 2019-04-22 NOTE — ED Notes (Signed)
[facility] called to transport patient  

## 2019-04-23 ENCOUNTER — Encounter (HOSPITAL_COMMUNITY): Payer: Self-pay | Admitting: Internal Medicine

## 2019-04-23 DIAGNOSIS — S72002D Fracture of unspecified part of neck of left femur, subsequent encounter for closed fracture with routine healing: Secondary | ICD-10-CM

## 2019-04-23 LAB — CBC WITH DIFFERENTIAL/PLATELET
Abs Immature Granulocytes: 0.07 10*3/uL (ref 0.00–0.07)
Basophils Absolute: 0 10*3/uL (ref 0.0–0.1)
Basophils Relative: 0 %
Eosinophils Absolute: 0 10*3/uL (ref 0.0–0.5)
Eosinophils Relative: 0 %
HCT: 39.3 % (ref 39.0–52.0)
Hemoglobin: 12.5 g/dL — ABNORMAL LOW (ref 13.0–17.0)
Immature Granulocytes: 0 %
Lymphocytes Relative: 11 %
Lymphs Abs: 1.8 10*3/uL (ref 0.7–4.0)
MCH: 33.9 pg (ref 26.0–34.0)
MCHC: 31.8 g/dL (ref 30.0–36.0)
MCV: 106.5 fL — ABNORMAL HIGH (ref 80.0–100.0)
Monocytes Absolute: 2.1 10*3/uL — ABNORMAL HIGH (ref 0.1–1.0)
Monocytes Relative: 12 %
Neutro Abs: 12.9 10*3/uL — ABNORMAL HIGH (ref 1.7–7.7)
Neutrophils Relative %: 77 %
Platelets: 153 10*3/uL (ref 150–400)
RBC: 3.69 MIL/uL — ABNORMAL LOW (ref 4.22–5.81)
RDW: 14.6 % (ref 11.5–15.5)
WBC: 16.9 10*3/uL — ABNORMAL HIGH (ref 4.0–10.5)
nRBC: 0 % (ref 0.0–0.2)

## 2019-04-23 LAB — COMPREHENSIVE METABOLIC PANEL
ALT: 69 U/L — ABNORMAL HIGH (ref 0–44)
AST: 139 U/L — ABNORMAL HIGH (ref 15–41)
Albumin: 3.4 g/dL — ABNORMAL LOW (ref 3.5–5.0)
Alkaline Phosphatase: 78 U/L (ref 38–126)
Anion gap: 11 (ref 5–15)
BUN: 25 mg/dL — ABNORMAL HIGH (ref 8–23)
CO2: 21 mmol/L — ABNORMAL LOW (ref 22–32)
Calcium: 8.6 mg/dL — ABNORMAL LOW (ref 8.9–10.3)
Chloride: 104 mmol/L (ref 98–111)
Creatinine, Ser: 1.33 mg/dL — ABNORMAL HIGH (ref 0.61–1.24)
GFR calc Af Amer: 53 mL/min — ABNORMAL LOW (ref >60)
GFR calc non Af Amer: 46 mL/min — ABNORMAL LOW (ref >60)
Glucose, Bld: 184 mg/dL — ABNORMAL HIGH (ref 70–99)
Potassium: 4.8 mmol/L (ref 3.5–5.1)
Sodium: 136 mmol/L (ref 135–145)
Total Bilirubin: 1.8 mg/dL — ABNORMAL HIGH (ref 0.3–1.2)
Total Protein: 6.3 g/dL — ABNORMAL LOW (ref 6.5–8.1)

## 2019-04-23 LAB — ABO/RH
ABO/RH(D): O POS
ABO/RH(D): O POS

## 2019-04-23 LAB — HEMOGLOBIN A1C
Hgb A1c MFr Bld: 6.5 % — ABNORMAL HIGH (ref 4.8–5.6)
Mean Plasma Glucose: 139.85 mg/dL

## 2019-04-23 LAB — SARS CORONAVIRUS 2 (TAT 6-24 HRS): SARS Coronavirus 2: NEGATIVE

## 2019-04-23 LAB — GLUCOSE, CAPILLARY: Glucose-Capillary: 167 mg/dL — ABNORMAL HIGH (ref 70–99)

## 2019-04-23 MED ORDER — MUPIROCIN 2 % EX OINT
1.0000 "application " | TOPICAL_OINTMENT | Freq: Two times a day (BID) | CUTANEOUS | Status: DC
  Administered 2019-04-23 – 2019-04-27 (×9): 1 via NASAL
  Filled 2019-04-23 (×2): qty 22

## 2019-04-23 MED ORDER — ONDANSETRON HCL 4 MG/2ML IJ SOLN
INTRAMUSCULAR | Status: AC
  Administered 2019-04-23: 4 mg via INTRAVENOUS
  Filled 2019-04-23: qty 2

## 2019-04-23 MED ORDER — ONDANSETRON HCL 4 MG/2ML IJ SOLN
4.0000 mg | Freq: Three times a day (TID) | INTRAMUSCULAR | Status: DC | PRN
  Administered 2019-04-23 (×2): 4 mg via INTRAVENOUS
  Filled 2019-04-23 (×2): qty 2

## 2019-04-23 MED ORDER — ENSURE ENLIVE PO LIQD
237.0000 mL | Freq: Two times a day (BID) | ORAL | Status: DC
  Administered 2019-04-24 – 2019-05-04 (×19): 237 mL via ORAL

## 2019-04-23 MED ORDER — BOOST / RESOURCE BREEZE PO LIQD CUSTOM
1.0000 | ORAL | Status: DC
  Administered 2019-04-24 – 2019-05-04 (×9): 1 via ORAL

## 2019-04-23 MED ORDER — DILTIAZEM HCL 30 MG PO TABS
30.0000 mg | ORAL_TABLET | Freq: Four times a day (QID) | ORAL | Status: DC
  Administered 2019-04-23 – 2019-05-04 (×45): 30 mg via ORAL
  Filled 2019-04-23 (×44): qty 1

## 2019-04-23 MED ORDER — ADULT MULTIVITAMIN W/MINERALS CH
1.0000 | ORAL_TABLET | Freq: Every day | ORAL | Status: DC
  Administered 2019-04-23 – 2019-05-04 (×11): 1 via ORAL
  Filled 2019-04-23 (×11): qty 1

## 2019-04-23 NOTE — Progress Notes (Signed)
PROGRESS NOTE    George [name]  [id number] DOB: [birth year] DOA: 04/22/2019 PCP: Merrilee Seashore, MD  Outpatient Specialists:    Brief Narrative:  Patient is a 85 year old Caucasian male with past medical history significant for A. fib, hyperlipidemia and prostate cancer. Patient was admitted with left periprosthetic femur fracture following a fall. Patient is awaiting an open reduction internal fixation of the left periprosthetic fracture that should be occurring Monday and/or Tuesday, as per orthopedic surgery team.  Assessment & Plan:   Principal Problem:   Hip fracture ([place]) Active Problems:   Chronic atrial fibrillation ([place])   HLD (hyperlipidemia)   1. Left periprosthetic hip fracture being followed by orthopedic surgery.  Plan is to do surgery on Monday, December 21.  Orthopedic advised to keep patient on Lovenox bridging and hold apixaban.  Pain relief medications. 2. Leukocytosis cause not clear.  Patient is afebrile chest x-ray UA pending follow CBC. 3. Chronic A. fib rate controlled without any medications.  On Lovenox bridging at this time.  Orthopedics aware. 4. History of prostate cancer under observation. 5. Macrocytosis. 6. Hyperlipidemia on Crestor.  DVT prophylaxis: Lovenox Code Status: DNR Family Communication: Disposition Plan: Awaiting Ortho surgery for now.  Consultants:   Orthopedics  Procedures:   None for now.  Antimicrobials:   None for now.   Subjective: No new complaints. Reports left femoral pain.  Objective: Vitals:   04/23/19 0257 04/23/19 0258 04/23/19 0300 04/23/19 0456  BP:   138/63 132/72  Pulse: 99 (!) 103 94 98  Resp: (!) 24 (!) 21 20 18   Temp:    98 F (36.7 C)  TempSrc:    Oral  SpO2: 98% 96% 97% 98%  Weight:      Height:        Intake/Output Summary (Last 24 hours) at 04/23/2019 1157 Last data filed at 04/23/2019 0600 Gross per 24 hour  Intake 0 ml  Output --  Net 0 ml   Filed Weights   04/22/19  2200  Weight: 79.4 kg    Examination:  General exam: Appears calm and comfortable  Respiratory system: Clear to auscultation. Respiratory effort normal. Cardiovascular system: S1 & S2 heard Gastrointestinal system: Abdomen is nondistended, soft and nontender. No organomegaly or masses felt. Normal bowel sounds heard. Central nervous system: Alert and oriented.  Extremities: No leg edema.  Data Reviewed: I have personally reviewed following labs and imaging studies  CBC: Recent Labs  Lab 04/22/19 2038 04/23/19 0618  WBC 24.0* 16.9*  NEUTROABS 21.1* 12.9*  HGB 13.4 12.5*  HCT 41.8 39.3  MCV 106.9* 106.5*  PLT 162 0000000   Basic Metabolic Panel: Recent Labs  Lab 04/22/19 2038 04/23/19 0618  NA 140 136  K 4.8 4.8  CL 106 104  CO2 22 21*  GLUCOSE 174* 184*  BUN 19 25*  CREATININE 1.10 1.33*  CALCIUM 8.7* 8.6*   GFR: Estimated Creatinine Clearance: 35.8 mL/min (A) (by C-G formula based on SCr of 1.33 mg/dL (H)). Liver Function Tests: Recent Labs  Lab 04/23/19 0618  AST 139*  ALT 69*  ALKPHOS 78  BILITOT 1.8*  PROT 6.3*  ALBUMIN 3.4*   No results for input(s): LIPASE, AMYLASE in the last 168 hours. No results for input(s): AMMONIA in the last 168 hours. Coagulation Profile: Recent Labs  Lab 04/22/19 2038  INR 1.1   Cardiac Enzymes: No results for input(s): CKTOTAL, CKMB, CKMBINDEX, TROPONINI in the last 168 hours. BNP (last 3 results) No results for input(s): PROBNP  in the last 8760 hours. HbA1C: Recent Labs    04/23/19 0618  HGBA1C 6.5*   CBG: Recent Labs  Lab 04/23/19 0756  GLUCAP 167*   Lipid Profile: No results for input(s): CHOL, HDL, LDLCALC, TRIG, CHOLHDL, LDLDIRECT in the last 72 hours. Thyroid Function Tests: No results for input(s): TSH, T4TOTAL, FREET4, T3FREE, THYROIDAB in the last 72 hours. Anemia Panel: No results for input(s): VITAMINB12, FOLATE, FERRITIN, TIBC, IRON, RETICCTPCT in the last 72 hours. Urine analysis: No results  found for: COLORURINE, APPEARANCEUR, LABSPEC, PHURINE, GLUCOSEU, HGBUR, BILIRUBINUR, KETONESUR, PROTEINUR, UROBILINOGEN, NITRITE, LEUKOCYTESUR Sepsis Labs: @LABRCNTIP (procalcitonin:4,lacticidven:4)  ) Recent Results (from the past 240 hour(s))  SARS CORONAVIRUS 2 (TAT 6-24 HRS) Nasopharyngeal Nasopharyngeal Swab     Status: None   Collection Time: 04/22/19  8:38 PM   Specimen: Nasopharyngeal Swab  Result Value Ref Range Status   SARS Coronavirus 2 NEGATIVE NEGATIVE Final    Comment: (NOTE) SARS-CoV-2 target nucleic acids are NOT DETECTED. The SARS-CoV-2 RNA is generally detectable in upper and lower respiratory specimens during the acute phase of infection. Negative results do not preclude SARS-CoV-2 infection, do not rule out co-infections with other pathogens, and should not be used as the sole basis for treatment or other patient management decisions. Negative results must be combined with clinical observations, patient history, and epidemiological information. The expected result is Negative. Fact Sheet for Patients: SugarRoll.be Fact Sheet for Healthcare Providers: https://www.woods-mathews.com/ This test is not yet approved or cleared by the [place] FDA and  has been authorized for detection and/or diagnosis of SARS-CoV-2 by FDA under an Emergency Use Authorization (EUA). This EUA will remain  in effect (meaning this test can be used) for the duration of the COVID-19 declaration under Section 56 4(b)(1) of the Act, 21 U.S.C. section 360bbb-3(b)(1), unless the authorization is terminated or revoked sooner. Performed at [facility], [place] [address] [place],  [address]          Radiology Studies: DG Chest 1 View  Result Date: 04/22/2019 CLINICAL DATA:  Pain status post fall EXAM: CHEST  1 VIEW COMPARISON:  12/14/2014 FINDINGS: The lung volumes are low. Bibasilar atelectasis is noted, greatest at the left lung  base. The heart size is enlarged. There is no pneumothorax or large pleural effusion. There is no obvious acute osseous abnormality. There are advanced degenerative changes of both glenohumeral joints. There is mild height loss of several lower thoracic vertebral bodies which appears stable from prior study. IMPRESSION: 1. Low lung volumes with bibasilar atelectasis, greatest at the left lung base. 2. Cardiomegaly. 3. Advanced degenerative changes of both glenohumeral joints. Electronically Signed   By: Constance Holster M.D.   On: 04/22/2019 20:27   DG Hip Unilat With Pelvis 2-3 Views Left  Result Date: 04/22/2019 CLINICAL DATA:  Pain status post fall EXAM: DG HIP (WITH OR WITHOUT PELVIS) 2-3V LEFT COMPARISON:  November 06, 2017 FINDINGS: The patient is status post total hip arthroplasty bilaterally. The hardware appears grossly intact. There is diffuse osteopenia. Again noted is a significantly displaced periprosthetic fracture involving the left femoral diaphysis. IMPRESSION: 1. Significantly displaced periprosthetic fracture involving the left femoral diaphysis. 2. Diffuse osteopenia. 3. Status post bilateral total hip arthroplasty. Electronically Signed   By: Constance Holster M.D.   On: 04/22/2019 20:29   DG Femur Portable 1 View Left  Result Date: 04/22/2019 CLINICAL DATA:  Femur fracture, postreduction. EXAM: LEFT FEMUR PORTABLE 1 VIEW COMPARISON:  Radiograph earlier this day. FINDINGS: Femoral shaft fracture  in slightly improved alignment after immobilizer placement. Persistent osseous overriding of 5.8 cm. Persistent apex medial angulation. IMPRESSION: Slight improvement in alignment of the femoral shaft fracture after immobilizer placement. Persistent osseous overriding of 5.8 cm. Electronically Signed   By: Keith Rake M.D.   On: 04/22/2019 22:02   DG FEMUR MIN 2 VIEWS LEFT  Result Date: 04/22/2019 CLINICAL DATA:  Pain status post fall EXAM: LEFT FEMUR 2 VIEWS COMPARISON:  November 06, 2017  FINDINGS: There is an acute significantly displaced periprosthetic fracture involving the left femoral diaphysis. There is still overlap of the fracture fragments of nearly 9 cm. There is surrounding soft tissue swelling. The patient is status post total hip arthroplasty on the left. There is osteopenia. There are advanced degenerative changes of the left knee. Vascular calcifications are noted. IMPRESSION: 1. Acute significantly displaced periprosthetic fracture of the left femoral diaphysis. 2. Osteopenia. 3. Advanced degenerative changes of the left knee. Electronically Signed   By: Constance Holster M.D.   On: 04/22/2019 20:28        Scheduled Meds: . enoxaparin (LOVENOX) injection  1 mg/kg Subcutaneous Q12H  . mupirocin ointment  1 application Nasal BID  . rosuvastatin  10 mg Oral Daily   Continuous Infusions:   LOS: 1 day    Time spent: 25 Minutes    Dana Allan, MD  [facility] Pager #: [phone] 7PM-7AM contact night coverage as above

## 2019-04-23 NOTE — ED Notes (Signed)
Updated report given to [facility] 4th floor nurse

## 2019-04-23 NOTE — Progress Notes (Signed)
Initial Nutrition Assessment  RD working remotely.   DOCUMENTATION CODES:   Not applicable  INTERVENTION:  - will order Boost Breeze once/day, each supplement provides 250 kcal and 9 grams of protein. - will order Ensure Enlive BID, each supplement provides 350 kcal and 20 grams of protein. - will order daily multivitamin with minerals.    NUTRITION DIAGNOSIS:   Increased nutrient needs related to acute illness, post-op healing as evidenced by estimated needs.  GOAL:   Patient will meet greater than or equal to 90% of their needs  MONITOR:   PO intake, Supplement acceptance, Labs, Weight trends  REASON FOR ASSESSMENT:   Consult Hip fracture protocol  ASSESSMENT:   85 y.o. male with history of A. fib, hyperlipidemia, and prostate cancer. Patient presented to the ED with L hip pain after twisting it when he missed a step while walking to his garage. He did not fall.  Unable to reach patient by phone. No intakes documented since admission. Per chart review, current weight is 175 lb and weight on 9/2 was 177 lb; weight appears to have been stable since at least 08/10/13 (weight that date was the same as weight yesterday). Patient has not been seen by a [place] RD at any point in the past.    Per notes: - L hip fx pending surgery 12/21 AM - patient has denied N/V, chest pain, or SOB   Labs reviewed; BUN: 25 mg/dl, creatinine: 1.33 mg/dl, Ca: 8.6 mg/dl, LFTs elevated, GFR: 46 ml/min. Medications reviewed.      NUTRITION - FOCUSED PHYSICAL EXAM:  unable to complete at this time.   Diet Order:   Diet Order            Diet Heart Room service appropriate? Yes; Fluid consistency: Thin  Diet effective now              EDUCATION NEEDS:   No education needs have been identified at this time  Skin:  Skin Assessment: Reviewed RN Assessment  Last BM:  12/18  Height:   Ht Readings from Last 1 Encounters:  04/22/19 5\' 10"  (1.778 m)    Weight:   Wt Readings  from Last 1 Encounters:  04/22/19 79.4 kg    Ideal Body Weight:  75.4 kg  BMI:  Body mass index is 25.11 kg/m.  Estimated Nutritional Needs:   Kcal:  1800-2000 kcal  Protein:  90-100 grams  Fluid:  >/= 2 L/day      Jarome Matin, MS, RD, LDN, [facility] Inpatient Clinical Dietitian Pager # [phone] After hours/weekend pager # [phone]

## 2019-04-23 NOTE — Progress Notes (Signed)
Subjective:   Patient has a left ascitic fracture that occurred yesterday.  Patient is awaiting surgery by Dr. Wynelle Link. Patient reports pain as moderate.  Complains of pain in the left leg. Overall doing okay, no complaints at this time.  No events overnight Denies any nausea vomiting, chest pain, shortness of breath  Objective: Vital signs in last 24 hours: Temp:  [97.8 F (36.6 C)-98 F (36.7 C)] 98 F (36.7 C) (12/19 0456) Pulse Rate:  [88-103] 98 (12/19 0456) Resp:  [15-24] 18 (12/19 0456) BP: (114-138)/(63-90) 132/72 (12/19 0456) SpO2:  [95 %-98 %] 98 % (12/19 0456) Weight:  [79.4 kg] 79.4 kg (12/18 2200)  Intake/Output from previous day: No intake/output data recorded. Intake/Output this shift: No intake/output data recorded.  Recent Labs    04/22/19 2038 04/23/19 0618  HGB 13.4 12.5*   Recent Labs    04/22/19 2038 04/23/19 0618  WBC 24.0* 16.9*  RBC 3.91* 3.69*  HCT 41.8 39.3  PLT 162 153   Recent Labs    04/22/19 2038 04/23/19 0618  NA 140 136  K 4.8 4.8  CL 106 104  CO2 22 21*  BUN 19 25*  CREATININE 1.10 1.33*  GLUCOSE 174* 184*  CALCIUM 8.7* 8.6*   Recent Labs    04/22/19 2038  INR 1.1    Neurologically intact Neurovascular intact Intact pulses distally Dorsiflexion/Plantar flexion intact Compartment soft Patient does have history of decreased sensation of the left leg for many years now.  He does have decreased sensation in his left leg, able to dorsiflex, plantarflex and move toes of the left leg with no issues.  2+ palpable posterior tibialis in dorsalis pedis pulses noted   Assessment/Plan:   Patient is awaiting an open reduction internal fixation of the left periprosthetic fracture that should be occurring Monday and/or Tuesday.  He will be seen by the surgeon on Monday morning.  Patient is going to continue to remain comfortable in the hospital.  All questions were answered for the patient at this time.    Drue Novel,  PA-C Orthopedic surgery IV:[id number] 04/23/2019, 10:02 AM

## 2019-04-24 ENCOUNTER — Inpatient Hospital Stay (HOSPITAL_COMMUNITY): Payer: Medicare Other

## 2019-04-24 DIAGNOSIS — N179 Acute kidney failure, unspecified: Secondary | ICD-10-CM

## 2019-04-24 DIAGNOSIS — R6510 Systemic inflammatory response syndrome (SIRS) of non-infectious origin without acute organ dysfunction: Secondary | ICD-10-CM

## 2019-04-24 LAB — URINALYSIS, ROUTINE W REFLEX MICROSCOPIC
Bilirubin Urine: NEGATIVE
Glucose, UA: NEGATIVE mg/dL
Hgb urine dipstick: NEGATIVE
Ketones, ur: 5 mg/dL — AB
Nitrite: NEGATIVE
Protein, ur: 30 mg/dL — AB
Specific Gravity, Urine: 1.021 (ref 1.005–1.030)
pH: 5 (ref 5.0–8.0)

## 2019-04-24 LAB — RENAL FUNCTION PANEL
Albumin: 2.7 g/dL — ABNORMAL LOW (ref 3.5–5.0)
Anion gap: 9 (ref 5–15)
BUN: 55 mg/dL — ABNORMAL HIGH (ref 8–23)
CO2: 25 mmol/L (ref 22–32)
Calcium: 8.2 mg/dL — ABNORMAL LOW (ref 8.9–10.3)
Chloride: 102 mmol/L (ref 98–111)
Creatinine, Ser: 3.07 mg/dL — ABNORMAL HIGH (ref 0.61–1.24)
GFR calc Af Amer: 19 mL/min — ABNORMAL LOW (ref >60)
GFR calc non Af Amer: 17 mL/min — ABNORMAL LOW (ref >60)
Glucose, Bld: 172 mg/dL — ABNORMAL HIGH (ref 70–99)
Phosphorus: 2.9 mg/dL (ref 2.5–4.6)
Potassium: 5 mmol/L (ref 3.5–5.1)
Sodium: 136 mmol/L (ref 135–145)

## 2019-04-24 LAB — CREATININE, SERUM
Creatinine, Ser: 3.01 mg/dL — ABNORMAL HIGH (ref 0.61–1.24)
GFR calc Af Amer: 20 mL/min — ABNORMAL LOW (ref >60)
GFR calc non Af Amer: 17 mL/min — ABNORMAL LOW (ref >60)

## 2019-04-24 LAB — CBC
HCT: 31.5 % — ABNORMAL LOW (ref 39.0–52.0)
Hemoglobin: 10.1 g/dL — ABNORMAL LOW (ref 13.0–17.0)
MCH: 34.4 pg — ABNORMAL HIGH (ref 26.0–34.0)
MCHC: 32.1 g/dL (ref 30.0–36.0)
MCV: 107.1 fL — ABNORMAL HIGH (ref 80.0–100.0)
Platelets: 150 10*3/uL (ref 150–400)
RBC: 2.94 MIL/uL — ABNORMAL LOW (ref 4.22–5.81)
RDW: 14.8 % (ref 11.5–15.5)
WBC: 27.6 10*3/uL — ABNORMAL HIGH (ref 4.0–10.5)
nRBC: 0 % (ref 0.0–0.2)

## 2019-04-24 LAB — PROTEIN / CREATININE RATIO, URINE
Creatinine, Urine: 251.02 mg/dL
Protein Creatinine Ratio: 0.12 mg/mg{Cre} (ref 0.00–0.15)
Total Protein, Urine: 30 mg/dL

## 2019-04-24 LAB — SURGICAL PCR SCREEN
MRSA, PCR: NEGATIVE
Staphylococcus aureus: NEGATIVE

## 2019-04-24 LAB — SODIUM, URINE, RANDOM: Sodium, Ur: 17 mmol/L

## 2019-04-24 LAB — PROCALCITONIN: Procalcitonin: 2.31 ng/mL

## 2019-04-24 MED ORDER — SODIUM CHLORIDE 0.9 % IV SOLN: INTRAVENOUS | Status: DC

## 2019-04-24 MED ORDER — METHOCARBAMOL 500 MG PO TABS
500.0000 mg | ORAL_TABLET | Freq: Four times a day (QID) | ORAL | Status: DC | PRN
  Administered 2019-04-26 – 2019-04-27 (×3): 500 mg via ORAL
  Filled 2019-04-24 (×3): qty 1

## 2019-04-24 MED ORDER — SODIUM CHLORIDE 0.9 % IV SOLN
2.0000 g | INTRAVENOUS | Status: DC
  Administered 2019-04-24 – 2019-04-25 (×2): 2 g via INTRAVENOUS
  Filled 2019-04-24 (×3): qty 2

## 2019-04-24 NOTE — Progress Notes (Addendum)
Discussed patient's Lovenox therapy with Theodis Shove, PharmD. Patient has had an acute increase in creatinine since starting therapeutic Lovenox, from 1.10 to 3.01. Spoke with Dr. Alvan Dame, who recommends holding chemoprophylaxis for now. Patient to have a discussion with Dr. Wynelle Link in the AM regarding surgery Monday vs Tuesday, and we will begin heparin therapy at that time if necessary. SCD to be placed on right leg.   He is admitted to the medical service, and we welcome their input at any point if they recommend changes to this plan.  Griffith Citron, PA-C Orthopedic Surgery [facility] [phone]

## 2019-04-24 NOTE — Evaluation (Signed)
Clinical/Bedside Swallow Evaluation Patient Details  Name: George [name] MRN: [id number] Date of Birth: 1923/12/31  Today's Date: 04/24/2019 Time: SLP Start Time (ACUTE ONLY): 1420 SLP Stop Time (ACUTE ONLY): 1443 SLP Time Calculation (min) (ACUTE ONLY): 23 min  Past Medical History:  Past Medical History:  Diagnosis Date  . BPH (benign prostatic hypertrophy)   . Chronic atrial fibrillation (HCC)    CHA2DS2VASC score 3    . Esophageal ring   . GERD (gastroesophageal reflux disease)   . History of GI bleed   . Hypercholesteremia   . Osteoarthritis   . Osteoporosis   . Peripheral neuropathy   . Peripheral neuropathy   . Prostate cancer ([place])    treated with observation  . Trigger finger of both hands   . Vertigo   . Vitamin D deficiency    Past Surgical History:  Past Surgical History:  Procedure Laterality Date  . BALLOON DILATION N/A 08/10/2013   Procedure: BALLOON DILATION;  Surgeon: Arta Silence, MD;  Location: [facility];  Service: Endoscopy;  Laterality: N/A;  . BOTOX INJECTION N/A 08/10/2013   Procedure: BOTOX INJECTION;  Surgeon: Arta Silence, MD;  Location: [facility];  Service: Endoscopy;  Laterality: N/A;  + or -      balloon/botox  . CARDIAC CATHETERIZATION    . CHOLECYSTECTOMY  2010  . ESOPHAGOGASTRODUODENOSCOPY (EGD) WITH PROPOFOL N/A 08/10/2013   Procedure: ESOPHAGOGASTRODUODENOSCOPY (EGD) WITH PROPOFOL;  Surgeon: Arta Silence, MD;  Location: [facility];  Service: Endoscopy;  Laterality: N/A;  . INGUINAL HERNIA REPAIR    . KNEE ARTHROCENTESIS    . skin cancer removed from face  1 week ago   area healing well, some areas reved in past also  . TOTAL HIP ARTHROPLASTY Bilateral yrs ago   hips   HPI:  Patient is a 85 year old Caucasian male with past medical history significant for A. fib, hyperlipidemia, reflux, hiatal hernia, esophageal dilation, and prostate cancer. Patient was admitted with left periprosthetic femur fracture following a fall.  Patient is awaiting an open reduction internal fixation of the left periprosthetic fracture that should be occurring Monday and/or Tuesday, as per orthopedic surgery team.   Assessment / Plan / Recommendation Clinical Impression  Pt presents with minimal oral dysphagia and suspected functional pharyngeal phase of the swallow.  Pt was encountered awake/alert and he was agreeable to ST evaluation.  He presented with a baseline cough upon arrival that he stated began during this hospital admission.  Pt reported hx of hiatal hernia and GERD with 2 previous esophageal dilations (most recent 2-3 years ago).  Pt additionally reported that he takes an over the counter medication at baseline to manage his reflux.  Pt stated that reflux has increased since admission and that he is having difficulty keeping solid foods down.  Pt was seen with trials of thin liquid, puree, and regular solids,  He exhibited good bolus acceptance and consistent hyolaryngeal elevation and excursion with all trials.  He presented with timely AP transport of thin liquid and puree trials.  Mastication and AP transport were mildly prolonged with regular solid trial and pt required a liquid wash to clear trace oral residue.  Pt exhibited a delayed cough following regular solid trial; otherwise no clinical s/sx of aspiration were observed.   Suspect esophageal component vs pharyngeal deficit.  Recommend Dysphagia 3 (soft) solids and thin liquid with the following precautions including: 1) Small bites/sips 2) Slow rate of intake 3) Sit upright 90 degrees 4) Remain upright for  20-30 minutes after po intake.  Pt verbalized understanding with teach back.  Additionally recommend consideration of a PPI and/or a GI consult to further evaluate esophageal function.  SLP will briefly f/u to monitor diet tolerance.    SLP Visit Diagnosis: Dysphagia, unspecified (R13.10)    Aspiration Risk  Mild aspiration risk    Diet Recommendation Dysphagia 3 (Mech  soft);Thin liquid   Liquid Administration via: Cup;Straw Medication Administration: Whole meds with liquid Supervision: Patient able to self feed;Intermittent supervision to cue for compensatory strategies Compensations: Slow rate;Small sips/bites Postural Changes: Seated upright at 90 degrees;Remain upright for at least 30 minutes after po intake    Other  Recommendations Recommended Consults: Consider GI evaluation Oral Care Recommendations: Oral care BID   Follow up Recommendations (TBD)      Frequency and Duration min 1 x/week  1 week       Prognosis Prognosis for Safe Diet Advancement: Good      Swallow Study   General HPI: Patient is a 85 year old Caucasian male with past medical history significant for A. fib, hyperlipidemia, reflux, hiatal hernia, esophageal dilation, and prostate cancer. Patient was admitted with left periprosthetic femur fracture following a fall. Patient is awaiting an open reduction internal fixation of the left periprosthetic fracture that should be occurring Monday and/or Tuesday, as per orthopedic surgery team. Type of Study: Bedside Swallow Evaluation Previous Swallow Assessment: none documented  Diet Prior to this Study: Regular;Thin liquids Temperature Spikes Noted: No Respiratory Status: Room air History of Recent Intubation: No Behavior/Cognition: Alert;Cooperative;Pleasant mood Oral Cavity Assessment: Within Functional Limits Oral Care Completed by SLP: No Oral Cavity - Dentition: Adequate natural dentition Vision: Functional for self-feeding Self-Feeding Abilities: Able to feed self;Needs set up Patient Positioning: Upright in bed Baseline Vocal Quality: Normal Volitional Cough: Strong Volitional Swallow: Able to elicit    Oral/Motor/Sensory Function Overall Oral Motor/Sensory Function: Within functional limits   Ice Chips Ice chips: Not tested   Thin Liquid Thin Liquid: Within functional limits Presentation: Straw;Cup    Nectar  Thick Nectar Thick Liquid: Not tested   Honey Thick Honey Thick Liquid: Not tested   Puree Puree: Within functional limits Presentation: Self Fed   Solid     Solid: Impaired Presentation: Self Fed Oral Phase Impairments: Impaired mastication Oral Phase Functional Implications: Oral residue;Impaired mastication;Prolonged oral transit Pharyngeal Phase Impairments: Cough - Delayed     Colin Mulders M.S., CCC-SLP Acute Rehabilitation Services Office: [phone]  Elvia Collum Chinenye Katzenberger 04/24/2019,3:07 PM

## 2019-04-24 NOTE — Progress Notes (Signed)
[place] for Lovenox Indication: atrial fibrillation  Labs: Recent Labs    04/22/19 2038 04/23/19 0618 04/24/19 0523  HGB 13.4 12.5* 10.1*  HCT 41.8 39.3 31.5*  PLT 162 153 150  LABPROT 14.0  --   --   INR 1.1  --   --   CREATININE 1.10 1.33* 3.01*    Assessment: 33 yoM presenting with fall resulting in L femur fracture. Pt on apixaban PTA for atrial fibrillation. Anticoagulation was converted to therapeutic LMWH on admission in anticipation of surgical intervention.  Anticoagulation: -Last dose of apixaban: 12/18 @ 0900 per med rec -Therapeutic enoxaparin 12/18 > 12/20. Last dose of enoxaparin 80 mg on 12/19 @ 2005  Today, 04/24/19  SCr significantly increased from 1.3 -> 3.0, CrCl now ~15 mL/min  Due to acutely worsening renal function, pt no longer a good candidate for therapeutic anticoagulation with LMWH. Currently will be anticoagulated 24 hours from last enoxaparin dose. Recommended transition to heparin infusion if continuing therapeutic AC.  Discussed case with hospitalist and ortho - potential surgery tomorrow (12/21). Per ortho - will hold anticoagulation starting tonight in anticipation of surgery tomorrow. Ortho to re-assess need for heparin drip tomorrow morning.    Plan:   Discontinued enoxaparin   Holding therapeutic anticoagulation at this time, pharmacy to sign off of LMWH consult. Re-consult if needed for future anticoagulation.  Lenis Noon, PharmD 04/24/19 9:26 AM

## 2019-04-24 NOTE — Progress Notes (Signed)
PROGRESS NOTE    [name]  [id number] DOB: 1924/03/08 DOA: 04/22/2019 PCP: Merrilee Seashore, MD  Outpatient Specialists:    Brief Narrative:  Patient is a 85 year old Caucasian male with past medical history significant for A. fib, hyperlipidemia and prostate cancer. Patient was admitted with left periprosthetic femur fracture following a fall. Patient is awaiting an open reduction internal fixation of the left periprosthetic fracture that should be occurring Monday and/or Tuesday, as per orthopedic surgery team.  04/24/2019: Significant leukocytosis noted today (27.6), with worsening acute kidney injury.  Serum creatinine has gone from 1.33 to 3.  Will work-up acute kidney injury.  Will proceed with renal ultrasound, urine sodium, urine protein and creatinine.  Will start patient on normal saline at 100 cc/h.  We will continue to monitor renal function and electrolytes closely.  Meanwhile, will rule out possible infective process.  We will panculture patient.  Will check urinalysis.  We will also check procalcitonin.  We will get a chest x-ray.  Further management will depend on hospital course.  Surgery may have to hold until patient is optimized.  Lovenox is currently on hold.  Discussed with pharmacy.  If surgery is delayed, consider heparin drip bridge.   Assessment & Plan:   Principal Problem:   Hip fracture ([place]) Active Problems:   Chronic atrial fibrillation (HCC)   HLD (hyperlipidemia)   Left periprosthetic hip fracture: -Orthopedic team is managing.  -Lovenox is currently on hold.   -If surgery is postponed further due to above medical problems, consider heparin drip bridge as patient has chronic atrial fibrillation.    SIRS/worsening leukocytosis:  -Chest x-ray is nonrevealing.   -UA reveals many bacteria  -Nasal swab is negative for MRSA  -Start IV cefepime  -Follow cultures  -Further management depend on hospital course.    Acute kidney injury:    -Possible prerenal  -Work-up AKI as documented above  -Cannot rule out ATN  -May have to postpone surgery for now.    Chronic atrial fibrillation:  -Rate controlled.   -Start heparin drip bridge if surgery is postponed.    Hyperlipidemia: Continue Crestor  History of prostate cancer.  DVT prophylaxis: Lovenox on hold Code Status: DNR Family Communication: Disposition Plan: Awaiting Ortho surgery for now.  Consultants:   Orthopedics  Procedures:   None for now.  Antimicrobials:   None for now.   Subjective: No new complaints. Reports left femoral pain.  Objective: Vitals:   04/23/19 1338 04/23/19 1759 04/23/19 2148 04/24/19 0536  BP: (!) 127/91 (!) 145/93 123/65 116/63  Pulse: 95 (!) 116 (!) 101 81  Resp: 16 16 16 20   Temp: 98.9 F (37.2 C) 98.5 F (36.9 C) 98.2 F (36.8 C) 97.9 F (36.6 C)  TempSrc:  Oral Oral Oral  SpO2: 90% 91% 92% 95%  Weight:      Height:        Intake/Output Summary (Last 24 hours) at 04/24/2019 1020 Last data filed at 04/23/2019 1753 Gross per 24 hour  Intake --  Output 100 ml  Net -100 ml   Filed Weights   04/22/19 2200  Weight: 79.4 kg    Examination:  General exam: Appears calm and comfortable  Respiratory system: Clear to auscultation. Respiratory effort normal. Cardiovascular system: S1 & S2 heard Gastrointestinal system: Abdomen is nondistended, soft and nontender. No organomegaly or masses felt. Normal bowel sounds heard. Central nervous system: Alert and oriented.  Extremities: No leg edema.  Data Reviewed: I have personally reviewed following  labs and imaging studies  CBC: Recent Labs  Lab 04/22/19 2038 04/23/19 0618 04/24/19 0523  WBC 24.0* 16.9* 27.6*  NEUTROABS 21.1* 12.9*  --   HGB 13.4 12.5* 10.1*  HCT 41.8 39.3 31.5*  MCV 106.9* 106.5* 107.1*  PLT 162 153 [id number]   Basic Metabolic Panel: Recent Labs  Lab 04/22/19 2038 04/23/19 0618 04/24/19 0523  NA 140 136  --   K 4.8 4.8  --   CL 106  104  --   CO2 22 21*  --   GLUCOSE 174* 184*  --   BUN 19 25*  --   CREATININE 1.10 1.33* 3.01*  CALCIUM 8.7* 8.6*  --    GFR: Estimated Creatinine Clearance: 15.8 mL/min (A) (by C-G formula based on SCr of 3.01 mg/dL (H)). Liver Function Tests: Recent Labs  Lab 04/23/19 0618  AST 139*  ALT 69*  ALKPHOS 78  BILITOT 1.8*  PROT 6.3*  ALBUMIN 3.4*   No results for input(s): LIPASE, AMYLASE in the last 168 hours. No results for input(s): AMMONIA in the last 168 hours. Coagulation Profile: Recent Labs  Lab 04/22/19 2038  INR 1.1   Cardiac Enzymes: No results for input(s): CKTOTAL, CKMB, CKMBINDEX, TROPONINI in the last 168 hours. BNP (last 3 results) No results for input(s): PROBNP in the last 8760 hours. HbA1C: Recent Labs    04/23/19 0618  HGBA1C 6.5*   CBG: Recent Labs  Lab 04/23/19 0756  GLUCAP 167*   Lipid Profile: No results for input(s): CHOL, HDL, LDLCALC, TRIG, CHOLHDL, LDLDIRECT in the last 72 hours. Thyroid Function Tests: No results for input(s): TSH, T4TOTAL, FREET4, T3FREE, THYROIDAB in the last 72 hours. Anemia Panel: No results for input(s): VITAMINB12, FOLATE, FERRITIN, TIBC, IRON, RETICCTPCT in the last 72 hours. Urine analysis: No results found for: COLORURINE, APPEARANCEUR, LABSPEC, PHURINE, GLUCOSEU, HGBUR, BILIRUBINUR, KETONESUR, PROTEINUR, UROBILINOGEN, NITRITE, LEUKOCYTESUR Sepsis Labs: @LABRCNTIP (procalcitonin:4,lacticidven:4)  ) Recent Results (from the past 240 hour(s))  SARS CORONAVIRUS 2 (TAT 6-24 HRS) Nasopharyngeal Nasopharyngeal Swab     Status: None   Collection Time: 04/22/19  8:38 PM   Specimen: Nasopharyngeal Swab  Result Value Ref Range Status   SARS Coronavirus 2 NEGATIVE NEGATIVE Final    Comment: (NOTE) SARS-CoV-2 target nucleic acids are NOT DETECTED. The SARS-CoV-2 RNA is generally detectable in upper and lower respiratory specimens during the acute phase of infection. Negative results do not preclude SARS-CoV-2  infection, do not rule out co-infections with other pathogens, and should not be used as the sole basis for treatment or other patient management decisions. Negative results must be combined with clinical observations, patient history, and epidemiological information. The expected result is Negative. Fact Sheet for Patients: SugarRoll.be Fact Sheet for Healthcare Providers: https://www.woods-mathews.com/ This test is not yet approved or cleared by the [place] FDA and  has been authorized for detection and/or diagnosis of SARS-CoV-2 by FDA under an Emergency Use Authorization (EUA). This EUA will remain  in effect (meaning this test can be used) for the duration of the COVID-19 declaration under Section 56 4(b)(1) of the Act, 21 U.S.C. section 360bbb-3(b)(1), unless the authorization is terminated or revoked sooner. Performed at [facility], [place] [address], [place], [place] [address]          Radiology Studies: DG Chest 1 View  Result Date: 04/22/2019 CLINICAL DATA:  Pain status post fall EXAM: CHEST  1 VIEW COMPARISON:  12/14/2014 FINDINGS: The lung volumes are low. Bibasilar atelectasis is noted, greatest at the  left lung base. The heart size is enlarged. There is no pneumothorax or large pleural effusion. There is no obvious acute osseous abnormality. There are advanced degenerative changes of both glenohumeral joints. There is mild height loss of several lower thoracic vertebral bodies which appears stable from prior study. IMPRESSION: 1. Low lung volumes with bibasilar atelectasis, greatest at the left lung base. 2. Cardiomegaly. 3. Advanced degenerative changes of both glenohumeral joints. Electronically Signed   By: Constance Holster M.D.   On: 04/22/2019 20:27   DG Hip Unilat With Pelvis 2-3 Views Left  Result Date: 04/22/2019 CLINICAL DATA:  Pain status post fall EXAM: DG HIP (WITH OR WITHOUT PELVIS) 2-3V LEFT  COMPARISON:  November 06, 2017 FINDINGS: The patient is status post total hip arthroplasty bilaterally. The hardware appears grossly intact. There is diffuse osteopenia. Again noted is a significantly displaced periprosthetic fracture involving the left femoral diaphysis. IMPRESSION: 1. Significantly displaced periprosthetic fracture involving the left femoral diaphysis. 2. Diffuse osteopenia. 3. Status post bilateral total hip arthroplasty. Electronically Signed   By: Constance Holster M.D.   On: 04/22/2019 20:29   DG Femur Portable 1 View Left  Result Date: 04/22/2019 CLINICAL DATA:  Femur fracture, postreduction. EXAM: LEFT FEMUR PORTABLE 1 VIEW COMPARISON:  Radiograph earlier this day. FINDINGS: Femoral shaft fracture in slightly improved alignment after immobilizer placement. Persistent osseous overriding of 5.8 cm. Persistent apex medial angulation. IMPRESSION: Slight improvement in alignment of the femoral shaft fracture after immobilizer placement. Persistent osseous overriding of 5.8 cm. Electronically Signed   By: Keith Rake M.D.   On: 04/22/2019 22:02   DG FEMUR MIN 2 VIEWS LEFT  Result Date: 04/22/2019 CLINICAL DATA:  Pain status post fall EXAM: LEFT FEMUR 2 VIEWS COMPARISON:  November 06, 2017 FINDINGS: There is an acute significantly displaced periprosthetic fracture involving the left femoral diaphysis. There is still overlap of the fracture fragments of nearly 9 cm. There is surrounding soft tissue swelling. The patient is status post total hip arthroplasty on the left. There is osteopenia. There are advanced degenerative changes of the left knee. Vascular calcifications are noted. IMPRESSION: 1. Acute significantly displaced periprosthetic fracture of the left femoral diaphysis. 2. Osteopenia. 3. Advanced degenerative changes of the left knee. Electronically Signed   By: Constance Holster M.D.   On: 04/22/2019 20:28        Scheduled Meds: . diltiazem  30 mg Oral Q6H  . feeding  supplement  1 Container Oral Q24H  . feeding supplement (ENSURE ENLIVE)  237 mL Oral BID BM  . multivitamin with minerals  1 tablet Oral Daily  . mupirocin ointment  1 application Nasal BID  . rosuvastatin  10 mg Oral Daily   Continuous Infusions: . sodium chloride       LOS: 2 days    Time spent: 41 Minutes    Dana Allan, MD  [facility] Pager #: [phone] 7PM-7AM contact night coverage as above

## 2019-04-24 NOTE — TOC Initial Note (Signed)
Transition of Care [facility]) - Initial/Assessment Note    Patient Details  Name: George [name] MRN: [id number] Date of Birth: 05/17/1923  Transition of Care (TOC) CM/SW Contact:    Joaquin Courts, RN Phone Number: 04/24/2019, 2:56 PM  Clinical Narrative:  [facility] consult received for HH/DME and SNF. CM reviewed cahrt, noted patient has not been seen by PT/OT. Will await recommendations and assists with dc planning as appropriate.                   Expected Discharge Plan: (uncertain at this time) Barriers to Discharge: Continued Medical Work up   Patient Goals and CMS Choice Patient states their goals for this hospitalization and ongoing recovery are:: to get better      Expected Discharge Plan and Services Expected Discharge Plan: (uncertain at this time)   Discharge Planning Services: CM Consult   Living arrangements for the past 2 months: Single Family Home                                      Prior Living Arrangements/Services Living arrangements for the past 2 months: Single Family Home Lives with:: Self Patient language and need for interpreter reviewed:: Yes Do you feel safe going back to the place where you live?: Yes      Need for Family Participation in Patient Care: Yes (Comment) Care giver support system in place?: Yes (comment)   Criminal Activity/Legal Involvement Pertinent to Current Situation/Hospitalization: No - Comment as needed  Activities of Daily Living Home Assistive Devices/Equipment: Walker (specify type), Cane (specify quad or straight) ADL Screening (condition at time of admission) Patient's cognitive ability adequate to safely complete daily activities?: Yes Is the patient deaf or have difficulty hearing?: No Does the patient have difficulty seeing, even when wearing glasses/contacts?: No Does the patient have difficulty concentrating, remembering, or making decisions?: No Patient able to express need for assistance with ADLs?:  Yes Does the patient have difficulty dressing or bathing?: No Independently performs ADLs?: Yes (appropriate for developmental age) Does the patient have difficulty walking or climbing stairs?: Yes Weakness of Legs: Left Weakness of Arms/Hands: None  Permission Sought/Granted                  Emotional Assessment           Psych Involvement: No (comment)  Admission diagnosis:  Hip fracture ([place]) [S72.009A] Fall, initial encounter [W19.XXXA] Periprosthetic fracture of hip, initial encounter [id number], [place] Patient Active Problem List   Diagnosis Date Noted  . Hip fracture ([place]) 04/22/2019  . Peri-prosthetic fracture around prosthetic hip   . Bilateral knee pain 02/24/2019  . Current use of long term anticoagulation 01/06/2019  . Non-occlusive coronary artery disease 01/06/2019  . Pure hypercholesterolemia 01/06/2019  . Primary osteoarthritis of both knees 06/17/2016  . Esophageal ring 12/14/2014  . Chronic atrial fibrillation ([place]) 12/14/2014  . Chest pain 12/14/2014  . HLD (hyperlipidemia) 12/14/2014  . History of GI bleed 12/14/2014  . Peripheral neuropathy w/gait disturbance 12/14/2014  . Pain in the chest   . Paroxysmal atrial fibrillation ([place])   . Gastroesophageal reflux disease without esophagitis    PCP:  Merrilee Seashore, MD Pharmacy:   [id number] - , [place] - [address] [facility] [place] [address] [place] [address] Phone: [phone] Fax: [phone]     Social Determinants of Health (SDOH)  Interventions    Readmission Risk Interventions No flowsheet data found.

## 2019-04-24 NOTE — Progress Notes (Addendum)
Pharmacy Antibiotic Note  George [name] is a 85 y.o. male admitted on 04/22/2019. Pharmacy has been consulted for cefepime dosing.  Pt admitted s/p fall resulting in left femur fracture, planning for surgical intervention. Antibiotics being initiated for PNA.   Today, 04/24/19  WBC 27.6 - elevated  SCr 3.01, CrCl 16 mL/min. SCr significantly increased  PCT 2.31  MRSA PCR ordered  Afebrile  Plan:  Cefepime 2 g IV q24h  MRSA PCR ordered, if positive will initiate vancomycin per discussion with MD  Monitor renal function and culture data  Height: 5\' 10"  (177.8 cm) Weight: 175 lb (79.4 kg) IBW/kg (Calculated) : 73  Temp (24hrs), Avg:98 F (36.7 C), Min:97.4 F (36.3 C), Max:98.5 F (36.9 C)  Recent Labs  Lab 04/22/19 2038 04/23/19 0618 04/24/19 0523  WBC 24.0* 16.9* 27.6*  CREATININE 1.10 1.33* 3.01*    Estimated Creatinine Clearance: 15.8 mL/min (A) (by C-G formula based on SCr of 3.01 mg/dL (H)).    Allergies  Allergen Reactions  . Aspirin Other (See Comments)    bleeding   Antimicrobials this admission: cefepime 12/20 >>   Dose adjustments this admission:  Microbiology results: 12/20 BCx: Sent 12/20 UCx: Sent  12/18 SARS-2: Negative  Thank you for allowing pharmacy to be a part of this patient's care.  Lenis Noon, PharmD 04/24/2019 2:27 PM   Addendum: MRSA PCR negative, d/c vancomycin consult.   Lenis Noon, PharmD 04/24/19 4:41 PM

## 2019-04-24 NOTE — Progress Notes (Signed)
Subjective:     Patient is admitted for left periprosthetic fracture that occurred on 12/18 after a fall onto his left side while stepping down one step into the garage. He is s/p bilateral total hip arthroplasty roughly 20 years ago by Dr. Wynelle Link. He was seen in the office for regular check up on 02/24/19 and was doing well at that time. Patient is awaiting surgery with Dr. Wynelle Link on either Monday or Tuesday. He is on Eliquis at baseline for atrial fibrillation, but has not had this in a few days. Today, patient states he is comfortable at rest. He denies CP, SHOB, N/V. He is voiding without difficulty. He states he has a chronic rattling cough that is on/off. He reports significant neuropathy of bilateral LE at baseline, and has minimal sensation in either foot. He does report tingling in the first three digits of his right hand, which has occurred on/off.    Patient seen in rounds for Dr. Wynelle Link.  Objective: Vital signs in last 24 hours: Temp:  [97.9 F (36.6 C)-98.9 F (37.2 C)] 97.9 F (36.6 C) (12/20 0536) Pulse Rate:  [81-116] 81 (12/20 0536) Resp:  [16-20] 20 (12/20 0536) BP: (116-145)/(63-93) 116/63 (12/20 0536) SpO2:  [90 %-95 %] 95 % (12/20 0536)  Intake/Output from previous day:  Intake/Output Summary (Last 24 hours) at 04/24/2019 0819 Last data filed at 04/23/2019 1753 Gross per 24 hour  Intake --  Output 100 ml  Net -100 ml     Intake/Output this shift: No intake/output data recorded.  Labs: Recent Labs    04/22/19 2038 04/23/19 0618 04/24/19 0523  HGB 13.4 12.5* 10.1*   Recent Labs    04/23/19 0618 04/24/19 0523  WBC 16.9* 27.6*  RBC 3.69* 2.94*  HCT 39.3 31.5*  PLT 153 150   Recent Labs    04/22/19 2038 04/23/19 0618 04/24/19 0523  NA 140 136  --   K 4.8 4.8  --   CL 106 104  --   CO2 22 21*  --   BUN 19 25*  --   CREATININE 1.10 1.33* 3.01*  GLUCOSE 174* 184*  --   CALCIUM 8.7* 8.6*  --    Recent Labs    04/22/19 2038  INR 1.1     Exam: General - Patient is Alert and Oriented Extremity - Neurologically intact Intact pulses distally Dorsiflexion/Plantar flexion intact  Patient reports no sensation to light touch in either foot, which he states is his baseline. Motor Function - intact, moving foot and toes well on exam.   Past Medical History:  Diagnosis Date  . BPH (benign prostatic hypertrophy)   . Chronic atrial fibrillation (HCC)    CHA2DS2VASC score 3    . Esophageal ring   . GERD (gastroesophageal reflux disease)   . History of GI bleed   . Hypercholesteremia   . Osteoarthritis   . Osteoporosis   . Peripheral neuropathy   . Peripheral neuropathy   . Prostate cancer ([place])    treated with observation  . Trigger finger of both hands   . Vertigo   . Vitamin D deficiency     Assessment/Plan:    Principal Problem:   Hip fracture (HCC) Active Problems:   Chronic atrial fibrillation (HCC)   HLD (hyperlipidemia)  Estimated body mass index is 25.11 kg/m as calculated from the following:   Height as of this encounter: 5\' 10"  (1.778 m).   Weight as of this encounter: 79.4 kg.   Assessment: Displaced left  periprosthetic femur fracture   Plan: Patient is awaiting an open reduction internal fixation of the left periprosthetic fracture that should be occurring Monday and/or Tuesday.  He will be seen by the surgeon on Monday morning.  Patient is going to continue to remain comfortable in the hospital. Per Dr. Alvan Dame, we will utilize [facility] traction for comfort measure. I have ordered methocarbamol in case of muscle spasm. Will hold Lovenox after PM dose today. Patient to be NPO after midnight in case of surgery tomorrow. Patient does not have family or help at home, and has questions regarding post-op disposition. This will be determined after surgery. All questions were answered for the patient at this time.  Griffith Citron, PA-C Orthopedic Surgery [phone] 04/24/2019, 8:19 AM

## 2019-04-25 ENCOUNTER — Encounter (HOSPITAL_COMMUNITY): Payer: Self-pay | Admitting: Internal Medicine

## 2019-04-25 DIAGNOSIS — N179 Acute kidney failure, unspecified: Secondary | ICD-10-CM

## 2019-04-25 HISTORY — DX: Acute kidney failure, unspecified: N17.9

## 2019-04-25 LAB — RENAL FUNCTION PANEL
Albumin: 2.5 g/dL — ABNORMAL LOW (ref 3.5–5.0)
Anion gap: 8 (ref 5–15)
BUN: 56 mg/dL — ABNORMAL HIGH (ref 8–23)
CO2: 25 mmol/L (ref 22–32)
Calcium: 8 mg/dL — ABNORMAL LOW (ref 8.9–10.3)
Chloride: 103 mmol/L (ref 98–111)
Creatinine, Ser: 2.73 mg/dL — ABNORMAL HIGH (ref 0.61–1.24)
GFR calc Af Amer: 22 mL/min — ABNORMAL LOW (ref >60)
GFR calc non Af Amer: 19 mL/min — ABNORMAL LOW (ref >60)
Glucose, Bld: 158 mg/dL — ABNORMAL HIGH (ref 70–99)
Phosphorus: 2.7 mg/dL (ref 2.5–4.6)
Potassium: 4.5 mmol/L (ref 3.5–5.1)
Sodium: 136 mmol/L (ref 135–145)

## 2019-04-25 LAB — CBC WITH DIFFERENTIAL/PLATELET
Abs Immature Granulocytes: 0.17 10*3/uL — ABNORMAL HIGH (ref 0.00–0.07)
Basophils Absolute: 0 10*3/uL (ref 0.0–0.1)
Basophils Relative: 0 %
Eosinophils Absolute: 0 10*3/uL (ref 0.0–0.5)
Eosinophils Relative: 0 %
HCT: 24.2 % — ABNORMAL LOW (ref 39.0–52.0)
Hemoglobin: 7.7 g/dL — ABNORMAL LOW (ref 13.0–17.0)
Immature Granulocytes: 1 %
Lymphocytes Relative: 7 %
Lymphs Abs: 1.3 10*3/uL (ref 0.7–4.0)
MCH: 34.8 pg — ABNORMAL HIGH (ref 26.0–34.0)
MCHC: 31.8 g/dL (ref 30.0–36.0)
MCV: 109.5 fL — ABNORMAL HIGH (ref 80.0–100.0)
Monocytes Absolute: 1.7 10*3/uL — ABNORMAL HIGH (ref 0.1–1.0)
Monocytes Relative: 9 %
Neutro Abs: 15.3 10*3/uL — ABNORMAL HIGH (ref 1.7–7.7)
Neutrophils Relative %: 83 %
Platelets: 133 10*3/uL — ABNORMAL LOW (ref 150–400)
RBC: 2.21 MIL/uL — ABNORMAL LOW (ref 4.22–5.81)
RDW: 15.2 % (ref 11.5–15.5)
WBC: 18.5 10*3/uL — ABNORMAL HIGH (ref 4.0–10.5)
nRBC: 0.2 % (ref 0.0–0.2)

## 2019-04-25 LAB — MAGNESIUM

## 2019-04-25 LAB — EXPECTORATED SPUTUM ASSESSMENT W GRAM STAIN, RFLX TO RESP C

## 2019-04-25 LAB — MRSA PCR SCREENING: MRSA by PCR: NEGATIVE

## 2019-04-25 MED ORDER — HEPARIN SODIUM (PORCINE) 5000 UNIT/ML IJ SOLN
5000.0000 [IU] | Freq: Three times a day (TID) | INTRAMUSCULAR | Status: AC
  Administered 2019-04-25 (×2): 5000 [IU] via SUBCUTANEOUS
  Filled 2019-04-25 (×2): qty 1

## 2019-04-25 NOTE — Progress Notes (Signed)
   Subjective: Patient reports pain as mild.   Patient seen in rounds by Dr. Wynelle Link. Patient is well, has no complaints other than pain in the left leg. Creatinine bumped from 3.01 to 3.07 this AM. Discussed with patient that due to kidney function surgery will need to be temporarily postponed until Wednesday. Pt is understanding.  Objective: Vital signs in last 24 hours: Temp:  [97.4 F (36.3 C)-98.5 F (36.9 C)] 98.5 F (36.9 C) (12/21 0405) Pulse Rate:  [78-96] 78 (12/21 0405) Resp:  [18-20] 18 (12/21 0405) BP: (112-130)/(53-71) 112/53 (12/21 0405) SpO2:  [93 %-95 %] 95 % (12/21 0405)  Intake/Output from previous day:  Intake/Output Summary (Last 24 hours) at 04/25/2019 0937 Last data filed at 04/25/2019 [id number] Gross per 24 hour  Intake 1912.13 ml  Output 400 ml  Net 1512.13 ml    Labs: Recent Labs    04/22/19 2038 04/23/19 0618 04/24/19 0523 04/25/19 0632  HGB 13.4 12.5* 10.1* 7.7*   Recent Labs    04/24/19 0523 04/25/19 0632  WBC 27.6* 18.5*  RBC 2.94* 2.21*  HCT 31.5* 24.2*  PLT 150 133*   Recent Labs    04/25/19 0449 04/25/19 0632  NA QUESTIONABLE RESULTS, RECOMMEND RECOLLECT TO VERIFY 136  K QUESTIONABLE RESULTS, RECOMMEND RECOLLECT TO VERIFY 4.5  CL QUESTIONABLE RESULTS, RECOMMEND RECOLLECT TO VERIFY 103  CO2 QUESTIONABLE RESULTS, RECOMMEND RECOLLECT TO VERIFY 25  BUN QUESTIONABLE RESULTS, RECOMMEND RECOLLECT TO VERIFY 56*  CREATININE QUESTIONABLE RESULTS, RECOMMEND RECOLLECT TO VERIFY 2.73*  GLUCOSE QUESTIONABLE RESULTS, RECOMMEND RECOLLECT TO VERIFY 158*  CALCIUM QUESTIONABLE RESULTS, RECOMMEND RECOLLECT TO VERIFY 8.0*   Recent Labs    04/22/19 2038  INR 1.1    Exam: General - Patient is Alert and Oriented Extremity - Neurologically intact Neurovascular intact Sensation intact distally Intact pulses distally Dorsiflexion/Plantar flexion intact  Motor Function - intact, moving foot and toes well on exam.  Past Medical History:    Diagnosis Date  . BPH (benign prostatic hypertrophy)   . Chronic atrial fibrillation (HCC)    CHA2DS2VASC score 3    . Esophageal ring   . GERD (gastroesophageal reflux disease)   . History of GI bleed   . Hypercholesteremia   . Osteoarthritis   . Osteoporosis   . Peripheral neuropathy   . Peripheral neuropathy   . Prostate cancer ([place])    treated with observation  . Trigger finger of both hands   . Vertigo   . Vitamin D deficiency     Assessment/Plan: * Surgery Date in Future * Procedure(s) (LRB): LEFT PERIPROSTHETIC FEMUR FRACTURE WITH ZIMMER CABLE AND PLATE (Left) Principal Problem:   Hip fracture (HCC) Active Problems:   Chronic atrial fibrillation (HCC)   HLD (hyperlipidemia)  Estimated body mass index is 25.11 kg/m as calculated from the following:   Height as of this encounter: 5\' 10"  (1.778 m).   Weight as of this encounter: 79.4 kg.  DVT Prophylaxis - Not currently on chemoprophylaxis therapy  Repeat creatinine ordered for today was 2.73. Will hold off on surgery until Wednesday, this has been discussed with the patient.   Will take medicine's input for chemoprophylaxis therapy until Wednesday given kidney function. Continue SCD to the right leg.   Theresa Duty, PA-C Orthopedic Surgery 04/25/2019, 9:37 AM

## 2019-04-25 NOTE — Progress Notes (Addendum)
Progress Note    George [name]  [id number] DOB: 15-Jan-1924  DOA: 04/22/2019 PCP: Merrilee Seashore, MD         Assessment/Plan:   Principal Problem:   Hip fracture [facility]) Active Problems:   Chronic atrial fibrillation ([place])   HLD (hyperlipidemia)   AKI (acute kidney injury) ([place])   Body mass index is 25.11 kg/m.   Left periprosthetic hip fracture: Analgesics as needed for pain.  Patient has been seen by orthopedic surgeon who plans to perform surgery on 04/27/2019.  Acute kidney injury: Slowly improving.  Continue IV fluids  Sepsis secondary to UTI/leukocytosis: Urine culture showed Enterococcus faecalis.  Sensitivity report is pending.  Continue IV cefepime.  Chronic atrial fibrillation: Eliquis on hold.  History of prostate cancer   Family Communication/Anticipated D/C date and plan/Code Status   DVT prophylaxis: Heparin subcu Code Status: Full code Family Communication: Discussed with the patient Disposition Plan: To be determined      Subjective:   C/o pain in the left hip.  No shortness of breath or chest pain.  Objective:    Vitals:   04/24/19 1705 04/24/19 2040 04/25/19 0405 04/25/19 1245  BP: 119/64 130/66 (!) 112/53 (!) 117/52  Pulse:  96 78 88  Resp:  18 18 17   Temp:  98.2 F (36.8 C) 98.5 F (36.9 C) 98.6 F (37 C)  TempSrc:  Oral Oral Oral  SpO2:  93% 95% 97%  Weight:      Height:        Intake/Output Summary (Last 24 hours) at 04/25/2019 1642 Last data filed at 04/25/2019 1400 Gross per 24 hour  Intake 2891.69 ml  Output 500 ml  Net 2391.69 ml   Filed Weights   04/22/19 2200  Weight: 79.4 kg    Exam:  GEN: NAD SKIN: No rash EYES: EOMI ENT: MMM CV: RRR PULM: CTA B ABD: soft, ND, NT, +BS CNS: AAO x 3, non focal EXT: Left lower extremity is elevated by traction GU: Foley catheter draining amber urine   Data Reviewed:   I have personally reviewed following labs and imaging studies:  Labs: Labs  show the following:   Basic Metabolic Panel: Recent Labs  Lab 04/22/19 2038 04/23/19 0618 04/24/19 0523 04/24/19 1919 04/25/19 0449 04/25/19 0632  NA 140 136  --  136 QUESTIONABLE RESULTS, RECOMMEND RECOLLECT TO VERIFY 136  K 4.8 4.8  --  5.0 QUESTIONABLE RESULTS, RECOMMEND RECOLLECT TO VERIFY 4.5  CL 106 104  --  102 QUESTIONABLE RESULTS, RECOMMEND RECOLLECT TO VERIFY 103  CO2 22 21*  --  25 QUESTIONABLE RESULTS, RECOMMEND RECOLLECT TO VERIFY 25  GLUCOSE 174* 184*  --  172* QUESTIONABLE RESULTS, RECOMMEND RECOLLECT TO VERIFY 158*  BUN 19 25*  --  55* QUESTIONABLE RESULTS, RECOMMEND RECOLLECT TO VERIFY 56*  CREATININE 1.10 1.33* 3.01* 3.07* QUESTIONABLE RESULTS, RECOMMEND RECOLLECT TO VERIFY 2.73*  CALCIUM 8.7* 8.6*  --  8.2* QUESTIONABLE RESULTS, RECOMMEND RECOLLECT TO VERIFY 8.0*  MG  --   --   --   --  QUESTIONABLE RESULTS, RECOMMEND RECOLLECT TO VERIFY  --   PHOS  --   --   --  2.9 QUESTIONABLE RESULTS, RECOMMEND RECOLLECT TO VERIFY 2.7   GFR Estimated Creatinine Clearance: 17.5 mL/min (A) (by C-G formula based on SCr of 2.73 mg/dL (H)). Liver Function Tests: Recent Labs  Lab 04/23/19 0618 04/24/19 1919 04/25/19 0449 04/25/19 0632  AST 139*  --   --   --  ALT 69*  --   --   --   ALKPHOS 78  --   --   --   BILITOT 1.8*  --   --   --   PROT 6.3*  --   --   --   ALBUMIN 3.4* 2.7* QUESTIONABLE RESULTS, RECOMMEND RECOLLECT TO VERIFY 2.5*   No results for input(s): LIPASE, AMYLASE in the last 168 hours. No results for input(s): AMMONIA in the last 168 hours. Coagulation profile Recent Labs  Lab 04/22/19 2038  INR 1.1    CBC: Recent Labs  Lab 04/22/19 2038 04/23/19 0618 04/24/19 0523 04/25/19 0632  WBC 24.0* 16.9* 27.6* 18.5*  NEUTROABS 21.1* 12.9*  --  15.3*  HGB 13.4 12.5* 10.1* 7.7*  HCT 41.8 39.3 31.5* 24.2*  MCV 106.9* 106.5* 107.1* 109.5*  PLT 162 153 150 133*   Cardiac Enzymes: No results for input(s): CKTOTAL, CKMB, CKMBINDEX, TROPONINI in the last  168 hours. BNP (last 3 results) No results for input(s): PROBNP in the last 8760 hours. CBG: Recent Labs  Lab 04/23/19 0756  GLUCAP 167*   D-Dimer: No results for input(s): DDIMER in the last 72 hours. Hgb A1c: Recent Labs    04/23/19 0618  HGBA1C 6.5*   Lipid Profile: No results for input(s): CHOL, HDL, LDLCALC, TRIG, CHOLHDL, LDLDIRECT in the last 72 hours. Thyroid function studies: No results for input(s): TSH, T4TOTAL, T3FREE, THYROIDAB in the last 72 hours.  Invalid input(s): FREET3 Anemia work up: No results for input(s): VITAMINB12, FOLATE, FERRITIN, TIBC, IRON, RETICCTPCT in the last 72 hours. Sepsis Labs: Recent Labs  Lab 04/22/19 2038 04/23/19 0618 04/24/19 0523 04/24/19 0606 04/25/19 [id number]  PROCALCITON  --   --   --  2.31  --   WBC 24.0* 16.9* 27.6*  --  18.5*    Microbiology Recent Results (from the past 240 hour(s))  SARS CORONAVIRUS 2 (TAT 6-24 HRS) Nasopharyngeal Nasopharyngeal Swab     Status: None   Collection Time: 04/22/19  8:38 PM   Specimen: Nasopharyngeal Swab  Result Value Ref Range Status   SARS Coronavirus 2 NEGATIVE NEGATIVE Final    Comment: (NOTE) SARS-CoV-2 target nucleic acids are NOT DETECTED. The SARS-CoV-2 RNA is generally detectable in upper and lower respiratory specimens during the acute phase of infection. Negative results do not preclude SARS-CoV-2 infection, do not rule out co-infections with other pathogens, and should not be used as the sole basis for treatment or other patient management decisions. Negative results must be combined with clinical observations, patient history, and epidemiological information. The expected result is Negative. Fact Sheet for Patients: SugarRoll.be Fact Sheet for Healthcare Providers: https://www.woods-mathews.com/ This test is not yet approved or cleared by the [place] FDA and  has been authorized for detection and/or diagnosis of SARS-CoV-2  by FDA under an Emergency Use Authorization (EUA). This EUA will remain  in effect (meaning this test can be used) for the duration of the COVID-19 declaration under Section 56 4(b)(1) of the Act, 21 U.S.C. section 360bbb-3(b)(1), unless the authorization is terminated or revoked sooner. Performed at [facility], [place] [address], [place], [place] [address]   Surgical PCR screen     Status: None   Collection Time: 04/24/19  9:00 AM   Specimen: Nasal Mucosa; Nasal Swab  Result Value Ref Range Status   MRSA, PCR NEGATIVE NEGATIVE Final   Staphylococcus aureus NEGATIVE NEGATIVE Final    Comment: (NOTE) The Xpert SA Assay (FDA approved for NASAL specimens in  patients 76 years of age and older), is one component of a comprehensive surveillance program. It is not intended to diagnose infection nor to guide or monitor treatment. Performed at [facility], [place] [address], [place], [place] [address]   Culture, Urine     Status: Abnormal (Preliminary result)   Collection Time: May 22, 2019 11:21 AM   Specimen: Urine, Clean Catch  Result Value Ref Range Status   Specimen Description   Final    URINE, CLEAN CATCH Performed at [facility], [place] [address] [place], [place] [address]    Special Requests   Final    NONE Performed at [facility], [place] [address] [place], [place] [address]    Culture >=100,000 COLONIES/mL ENTEROCOCCUS FAECALIS (A)  Final   Report Status PENDING  Incomplete  Culture, blood (routine x 2)     Status: None (Preliminary result)   Collection Time: 22-May-2019 11:35 AM   Specimen: BLOOD  Result Value Ref Range Status   Specimen Description   Final    BLOOD RIGHT ANTECUBITAL Performed at [facility], [place] [address], [place], [place] [address]    Special Requests   Final    BOTTLES DRAWN AEROBIC AND ANAEROBIC Blood Culture adequate volume Performed at [facility], [place] [address] [place], [place] [address]    Culture   Final    NO GROWTH < 24 HOURS Performed at [place] [address], [place], [place] [address]    Report Status PENDING  Incomplete  Culture, blood (routine x 2)     Status: None (Preliminary result)   Collection Time: 05/22/19 11:41 AM   Specimen: BLOOD RIGHT ARM  Result Value Ref Range Status   Specimen Description   Final    BLOOD RIGHT ARM Performed at [facility], [place] [address] [place], [place] [address]    Special Requests   Final    BOTTLES DRAWN AEROBIC AND ANAEROBIC Blood Culture adequate volume Performed at [facility], [place] [address], [place], [place] [address]    Culture   Final    NO GROWTH < 24 HOURS Performed at [place] [address], [place], [place] [address]    Report Status PENDING  Incomplete    Procedures and diagnostic studies:  US Renal  Result Date: 05-22-19 CLINICAL DATA:  85 year old male with acute kidney injury. EXAM: RENAL / URINARY TRACT ULTRASOUND COMPLETE COMPARISON:  12/09/2009 ultrasound and CT FINDINGS: Right Kidney: Renal measurements: 10.1 x 5 x 4.3 cm = volume: 112 mL. UPPER limits normal renal echogenicity noted. Two large renal cysts are noted, measuring 5.8 cm and 6.4 cm respectively. No solid mass or hydronephrosis. Mild cortical atrophy noted. Left Kidney: Renal measurements: 10.5 x 5.2 x 5.1 cm = volume: 145 mL. UPPER limits normal renal echogenicity noted with mild cortical atrophy. No solid or cystic mass or hydronephrosis noted. Bladder: Appears normal for degree of bladder distention. Other: None. IMPRESSION: 1. UPPER limits of normal renal echogenicity with mild bilateral renal cortical atrophy. 2. No evidence of hydronephrosis. 3. RIGHT renal cysts. Electronically Signed   By: Margarette Canada M.D.   On: 2019/05/22 12:30   DG CHEST PORT 1 VIEW  Result Date: 22-May-2019 CLINICAL DATA:   Leukocytosis EXAM: PORTABLE CHEST 1 VIEW COMPARISON:  04/22/2019 and prior radiographs FINDINGS: Cardiomegaly size and minimal LEFT basilar atelectasis/scarring again noted. There is no evidence of  focal airspace disease, pulmonary edema, suspicious pulmonary nodule/mass, pleural effusion, or pneumothorax. No acute bony abnormalities are identified. IMPRESSION: 1. No evidence of acute cardiopulmonary disease. 2. Unchanged minimal LEFT basilar atelectasis/scarring. Electronically Signed   By: Margarette Canada M.D.   On: 04/24/2019 12:28    Medications:   . diltiazem  30 mg Oral Q6H  . feeding supplement  1 Container Oral Q24H  . feeding supplement (ENSURE ENLIVE)  237 mL Oral BID BM  . heparin injection (subcutaneous)  5,000 Units Subcutaneous Q8H  . multivitamin with minerals  1 tablet Oral Daily  . mupirocin ointment  1 application Nasal BID  . rosuvastatin  10 mg Oral Daily   Continuous Infusions: . sodium chloride 100 mL/hr at 04/25/19 0819  . ceFEPime (MAXIPIME) IV 2 g (04/25/19 1110)     LOS: 3 days   Samael Blades  [facility]   *Please refer to [email or web address], password [email or web address] to get updated schedule on who will round on this patient, as hospitalists switch teams weekly. If 7PM-7AM, please contact night-coverage at [email or web address], password [email or web address] for any overnight needs.  04/25/2019, 4:42 PM

## 2019-04-25 NOTE — Progress Notes (Signed)
Wife  Inez Catalina is asking that Alusio call her at [phone] (cell) [phone] (home) after surgery is completed on 12/22  Thank you!

## 2019-04-26 ENCOUNTER — Encounter (HOSPITAL_COMMUNITY): Payer: Self-pay | Admitting: Internal Medicine

## 2019-04-26 ENCOUNTER — Inpatient Hospital Stay (HOSPITAL_COMMUNITY): Payer: Medicare Other

## 2019-04-26 LAB — CBC
HCT: 21.2 % — ABNORMAL LOW (ref 39.0–52.0)
Hemoglobin: 6.6 g/dL — CL (ref 13.0–17.0)
MCH: 34.7 pg — ABNORMAL HIGH (ref 26.0–34.0)
MCHC: 31.1 g/dL (ref 30.0–36.0)
MCV: 111.6 fL — ABNORMAL HIGH (ref 80.0–100.0)
Platelets: 130 10*3/uL — ABNORMAL LOW (ref 150–400)
RBC: 1.9 MIL/uL — ABNORMAL LOW (ref 4.22–5.81)
RDW: 15.4 % (ref 11.5–15.5)
WBC: 15.6 10*3/uL — ABNORMAL HIGH (ref 4.0–10.5)
nRBC: 1.2 % — ABNORMAL HIGH (ref 0.0–0.2)

## 2019-04-26 LAB — CREATININE, SERUM
Creatinine, Ser: 2.1 mg/dL — ABNORMAL HIGH (ref 0.61–1.24)
GFR calc Af Amer: 31 mL/min — ABNORMAL LOW (ref >60)
GFR calc non Af Amer: 26 mL/min — ABNORMAL LOW (ref >60)

## 2019-04-26 LAB — IRON AND TIBC
Iron: 34 ug/dL — ABNORMAL LOW (ref 45–182)
Saturation Ratios: 17 % — ABNORMAL LOW (ref 17.9–39.5)
TIBC: 203 ug/dL — ABNORMAL LOW (ref 250–450)
UIBC: 169 ug/dL

## 2019-04-26 LAB — FERRITIN: Ferritin: 95 ng/mL (ref 24–336)

## 2019-04-26 LAB — URINE CULTURE: Culture: >=100000 — AB

## 2019-04-26 LAB — PREPARE RBC (CROSSMATCH)

## 2019-04-26 LAB — HEMOGLOBIN AND HEMATOCRIT, BLOOD
HCT: 25.3 % — ABNORMAL LOW (ref 39.0–52.0)
Hemoglobin: 8.1 g/dL — ABNORMAL LOW (ref 13.0–17.0)

## 2019-04-26 LAB — VITAMIN B12: Vitamin B-12: 316 pg/mL (ref 180–914)

## 2019-04-26 MED ORDER — FUROSEMIDE 10 MG/ML IJ SOLN
20.0000 mg | Freq: Once | INTRAMUSCULAR | Status: AC
  Administered 2019-04-26: 20 mg via INTRAVENOUS
  Filled 2019-04-26: qty 2

## 2019-04-26 MED ORDER — DIPHENHYDRAMINE HCL 25 MG PO CAPS
25.0000 mg | ORAL_CAPSULE | Freq: Once | ORAL | Status: AC
  Administered 2019-04-26: 25 mg via ORAL
  Filled 2019-04-26: qty 1

## 2019-04-26 MED ORDER — SODIUM CHLORIDE 0.9% IV SOLUTION: Freq: Once | INTRAVENOUS | Status: AC

## 2019-04-26 MED ORDER — SODIUM CHLORIDE 0.9 % IV SOLN
1.0000 g | Freq: Three times a day (TID) | INTRAVENOUS | Status: DC
  Administered 2019-04-27 – 2019-04-28 (×3): 1 g via INTRAVENOUS
  Filled 2019-04-26 (×3): qty 1000
  Filled 2019-04-26 (×3): qty 1
  Filled 2019-04-26: qty 1000

## 2019-04-26 MED ORDER — VANCOMYCIN HCL 1250 MG/250ML IV SOLN: 1250.0000 mg | INTRAVENOUS | Status: DC

## 2019-04-26 MED ORDER — VANCOMYCIN HCL 1500 MG/300ML IV SOLN
1500.0000 mg | Freq: Once | INTRAVENOUS | Status: AC
  Administered 2019-04-26: 1500 mg via INTRAVENOUS
  Filled 2019-04-26: qty 300

## 2019-04-26 MED ORDER — HEPARIN SODIUM (PORCINE) 5000 UNIT/ML IJ SOLN
5000.0000 [IU] | Freq: Three times a day (TID) | INTRAMUSCULAR | Status: DC
  Administered 2019-04-26: 5000 [IU] via SUBCUTANEOUS
  Filled 2019-04-26: qty 1

## 2019-04-26 MED ORDER — ACETAMINOPHEN 325 MG PO TABS
650.0000 mg | ORAL_TABLET | Freq: Once | ORAL | Status: AC
  Administered 2019-04-26: 650 mg via ORAL
  Filled 2019-04-26: qty 2

## 2019-04-26 NOTE — Progress Notes (Signed)
Pharmacy Antibiotic Note  George [name] is a 85 y.o. male presented to the ED on 04/22/2019  with c/o left hip pain s/p fall.  Hip x-ray on 12/18 showed "significantly displaced periprosthetic fracture involving the left femoral diaphysis". Plan is to take patient to the OR on 12/23 for repair of fracture.  He was started on cefepime on 12/20 for suspected PNA.  CXR on 12/21 showed no acute findings.  UCX on 12/20 has >100K enterococcus faecalis.  Pharmacy was consulted on 12/22 to d/c cefepime and start vancomycin for UTI.  Today, 04/26/2019: - afeb, wbc elevated but trending down - scr improving with 2.10 (crc~23)  Plan: - d/c cefepime - start vancomycin 1500 mg IV x1, then 1250 mg q48h for est AUC 473 - monitor renal function closely __________________________________________  Height: 5\' 10"  (177.8 cm) Weight: 175 lb (79.4 kg) IBW/kg (Calculated) : 73  Temp (24hrs), Avg:98.2 F (36.8 C), Min:98 F (36.7 C), Max:98.6 F (37 C)  Recent Labs  Lab 04/22/19 2038 04/23/19 0618 04/24/19 0523 04/24/19 1919 04/25/19 0449 04/25/19 0632 04/26/19 0526  WBC 24.0* 16.9* 27.6*  --   --  18.5* 15.6*  CREATININE 1.10 1.33* 3.01* 3.07* QUESTIONABLE RESULTS, RECOMMEND RECOLLECT TO VERIFY 2.73* 2.10*    Estimated Creatinine Clearance: 22.7 mL/min (A) (by C-G formula based on SCr of 2.1 mg/dL (H)).    Allergies  Allergen Reactions  . Aspirin Other (See Comments)    bleeding    Antimicrobials this admission: 12/20 cefepime >> 12/22 12/22 vanc>>     Microbiology results: 12/20 BCx x2:  12/20 UCx: > 100K enterococcus faecalis (pans sens) FINAL 12/18 SARS-2: Negative 12/21 sputum cx: moderate GNR and GPC in clusters 12/20 MRSA PCR: neg    Thank you for allowing pharmacy to be a part of this patient's care.  Lynelle Doctor 04/26/2019 8:57 AM

## 2019-04-26 NOTE — Progress Notes (Addendum)
Open reduction internal fixation left periprosthetic femur fracture scheduled for 2:20 PM on 04/27/2019. NPO at midnight. Consent in chart. Heparin for DVT prophylaxis in meantime - needs to be discontinued at a minimum of 6 hours prior to OR.  Theresa Duty, PA-C Orthopedic Surgery [facility]

## 2019-04-26 NOTE — Progress Notes (Signed)
Progress Note    George [name]  [id number] DOB: 23-Oct-1923  DOA: 04/22/2019 PCP: Merrilee Seashore, MD         Assessment/Plan:   Principal Problem:   Hip fracture [facility]) Active Problems:   Chronic atrial fibrillation ([place])   HLD (hyperlipidemia)   AKI (acute kidney injury) ([place])   Body mass index is 25.11 kg/m.   Left periprosthetic hip fracture: Analgesics as needed for pain.  Patient has been seen by orthopedic surgeon who plans to perform surgery on 04/27/2019.  Left hip superficial hematoma: Follow-up with hip surgeon.  Acute blood loss anemia: Transfuse 1 unit of packed red blood cells and monitor H&H.  IV Lasix posttransfusion to prevent fluid overload  Acute kidney injury: Slowly improving.  Hold IV fluids because of concern for fluid overload. Monitor BMP.   Sepsis secondary to UTI/leukocytosis: Urine culture showed Enterococcus faecalis.  Change antibiotics to IV ampicillin.  Discussed with pharmacist.  Chronic atrial fibrillation: Eliquis on hold.  History of prostate cancer   Family Communication/Anticipated D/C date and plan/Code Status   DVT prophylaxis: Heparin subcu Code Status: Full code Family Communication: Plan discussed with the patient Disposition Plan: To be determined      Subjective:   He has very little pain in the left hip.  Earlier this morning, he felt fine when I saw him.  However, later in the day, patient reported feeling a little short of breath to his nurse and he had developed mild wheezing as well.  Objective:    Vitals:   04/26/19 0045 04/26/19 0549 04/26/19 1316 04/26/19 1352  BP: (!) 121/57 (!) 115/52 (!) 109/52 130/64  Pulse: 89 64 78 77  Resp:  18 16 19   Temp:  98 F (36.7 C) (!) 97.5 F (36.4 C) 98.5 F (36.9 C)  TempSrc:  Oral Oral Oral  SpO2: 97% 97% 99% 97%  Weight:      Height:        Intake/Output Summary (Last 24 hours) at 04/26/2019 1356 Last data filed at 04/26/2019 0930 Gross  per 24 hour  Intake 2571.39 ml  Output 450 ml  Net 2121.39 ml   Filed Weights   04/22/19 2200  Weight: 79.4 kg    Exam:   GEN: NAD SKIN: No rash EYES: pale, anicteric ENT: MMM CV: RRR PULM: CTA B ABD: soft, ND, NT, +BS CNS: AAO x 3, non focal EXT: Left hip swelling but no tenderness GU: Foley catheter in place. Dark/purple discoloration of scrotum but no tenderness or swelling of scrotum    Data Reviewed:   I have personally reviewed following labs and imaging studies:  Labs: Labs show the following:   Basic Metabolic Panel: Recent Labs  Lab 04/22/19 2038 04/23/19 0618 04/24/19 0523 04/24/19 1919 04/25/19 0449 04/25/19 0632 04/26/19 0526  NA 140 136  --  136 QUESTIONABLE RESULTS, RECOMMEND RECOLLECT TO VERIFY 136  --   K 4.8 4.8  --  5.0 QUESTIONABLE RESULTS, RECOMMEND RECOLLECT TO VERIFY 4.5  --   CL 106 104  --  102 QUESTIONABLE RESULTS, RECOMMEND RECOLLECT TO VERIFY 103  --   CO2 22 21*  --  25 QUESTIONABLE RESULTS, RECOMMEND RECOLLECT TO VERIFY 25  --   GLUCOSE 174* 184*  --  172* QUESTIONABLE RESULTS, RECOMMEND RECOLLECT TO VERIFY 158*  --   BUN 19 25*  --  55* QUESTIONABLE RESULTS, RECOMMEND RECOLLECT TO VERIFY 56*  --   CREATININE 1.10 1.33* 3.01* 3.07* QUESTIONABLE RESULTS,  RECOMMEND RECOLLECT TO VERIFY 2.73* 2.10*  CALCIUM 8.7* 8.6*  --  8.2* QUESTIONABLE RESULTS, RECOMMEND RECOLLECT TO VERIFY 8.0*  --   MG  --   --   --   --  QUESTIONABLE RESULTS, RECOMMEND RECOLLECT TO VERIFY  --   --   PHOS  --   --   --  2.9 QUESTIONABLE RESULTS, RECOMMEND RECOLLECT TO VERIFY 2.7  --    GFR Estimated Creatinine Clearance: 22.7 mL/min (A) (by C-G formula based on SCr of 2.1 mg/dL (H)). Liver Function Tests: Recent Labs  Lab 04/23/19 0618 04/24/19 1919 04/25/19 0449 04/25/19 0632  AST 139*  --   --   --   ALT 69*  --   --   --   ALKPHOS 78  --   --   --   BILITOT 1.8*  --   --   --   PROT 6.3*  --   --   --   ALBUMIN 3.4* 2.7* QUESTIONABLE RESULTS,  RECOMMEND RECOLLECT TO VERIFY 2.5*   No results for input(s): LIPASE, AMYLASE in the last 168 hours. No results for input(s): AMMONIA in the last 168 hours. Coagulation profile Recent Labs  Lab 04/22/19 2038  INR 1.1    CBC: Recent Labs  Lab 04/22/19 2038 04/23/19 0618 04/24/19 0523 04/25/19 0632 04/26/19 0526  WBC 24.0* 16.9* 27.6* 18.5* 15.6*  NEUTROABS 21.1* 12.9*  --  15.3*  --   HGB 13.4 12.5* 10.1* 7.7* 6.6*  HCT 41.8 39.3 31.5* 24.2* 21.2*  MCV 106.9* 106.5* 107.1* 109.5* 111.6*  PLT 162 153 150 133* 130*   Cardiac Enzymes: No results for input(s): CKTOTAL, CKMB, CKMBINDEX, TROPONINI in the last 168 hours. BNP (last 3 results) No results for input(s): PROBNP in the last 8760 hours. CBG: Recent Labs  Lab 04/23/19 0756  GLUCAP 167*   D-Dimer: No results for input(s): DDIMER in the last 72 hours. Hgb A1c: No results for input(s): HGBA1C in the last 72 hours. Lipid Profile: No results for input(s): CHOL, HDL, LDLCALC, TRIG, CHOLHDL, LDLDIRECT in the last 72 hours. Thyroid function studies: No results for input(s): TSH, T4TOTAL, T3FREE, THYROIDAB in the last 72 hours.  Invalid input(s): FREET3 Anemia work up: No results for input(s): VITAMINB12, FOLATE, FERRITIN, TIBC, IRON, RETICCTPCT in the last 72 hours. Sepsis Labs: Recent Labs  Lab 04/23/19 0618 04/24/19 0523 04/24/19 0606 04/25/19 [id number] 04/26/19 0526  PROCALCITON  --   --  2.31  --   --   WBC 16.9* 27.6*  --  18.5* 15.6*    Microbiology Recent Results (from the past 240 hour(s))  SARS CORONAVIRUS 2 (TAT 6-24 HRS) Nasopharyngeal Nasopharyngeal Swab     Status: None   Collection Time: 04/22/19  8:38 PM   Specimen: Nasopharyngeal Swab  Result Value Ref Range Status   SARS Coronavirus 2 NEGATIVE NEGATIVE Final    Comment: (NOTE) SARS-CoV-2 target nucleic acids are NOT DETECTED. The SARS-CoV-2 RNA is generally detectable in upper and lower respiratory specimens during the acute phase of  infection. Negative results do not preclude SARS-CoV-2 infection, do not rule out co-infections with other pathogens, and should not be used as the sole basis for treatment or other patient management decisions. Negative results must be combined with clinical observations, patient history, and epidemiological information. The expected result is Negative. Fact Sheet for Patients: SugarRoll.be Fact Sheet for Healthcare Providers: https://www.woods-mathews.com/ This test is not yet approved or cleared by the [place] and  has been authorized  for detection and/or diagnosis of SARS-CoV-2 by FDA under an Emergency Use Authorization (EUA). This EUA will remain  in effect (meaning this test can be used) for the duration of the COVID-19 declaration under Section 56 4(b)(1) of the Act, 21 U.S.C. section 360bbb-3(b)(1), unless the authorization is terminated or revoked sooner. Performed at [facility], [place] [address] [place], [place] [address]   Surgical PCR screen     Status: None   Collection Time: 04/24/19  9:00 AM   Specimen: Nasal Mucosa; Nasal Swab  Result Value Ref Range Status   MRSA, PCR NEGATIVE NEGATIVE Final   Staphylococcus aureus NEGATIVE NEGATIVE Final    Comment: (NOTE) The Xpert SA Assay (FDA approved for NASAL specimens in patients 30 years of age and older), is one component of a comprehensive surveillance program. It is not intended to diagnose infection nor to guide or monitor treatment. Performed at [facility], [place] [address], [place], [place] [address]   MRSA PCR Screening     Status: None   Collection Time: 04/24/19 10:29 AM   Specimen: Nasopharyngeal  Result Value Ref Range Status   MRSA by PCR NEGATIVE NEGATIVE Final    Comment:        The GeneXpert MRSA Assay (FDA approved for NASAL specimens only), is one component of a comprehensive MRSA colonization surveillance program. It is  not intended to diagnose MRSA infection nor to guide or monitor treatment for MRSA infections. Performed at [facility], [place] [address], [place], [place] [address]   Culture, Urine     Status: Abnormal   Collection Time: 04/24/19 11:21 AM   Specimen: Urine, Clean Catch  Result Value Ref Range Status   Specimen Description   Final    URINE, CLEAN CATCH Performed at [facility], [place] [address], [place], [place] [address]    Special Requests   Final    NONE Performed at [facility], [place] [address], [place], [place] [address]    Culture >=100,000 COLONIES/mL ENTEROCOCCUS FAECALIS (A)  Final   Report Status 04/26/2019 FINAL  Final   Organism ID, Bacteria ENTEROCOCCUS FAECALIS (A)  Final      Susceptibility   Enterococcus faecalis - MIC*    AMPICILLIN <=2 SENSITIVE Sensitive     NITROFURANTOIN <=16 SENSITIVE Sensitive     VANCOMYCIN 1 SENSITIVE Sensitive     * >=100,000 COLONIES/mL ENTEROCOCCUS FAECALIS  Culture, blood (routine x 2)     Status: None (Preliminary result)   Collection Time: 04/24/19 11:35 AM   Specimen: BLOOD  Result Value Ref Range Status   Specimen Description   Final    BLOOD RIGHT ANTECUBITAL Performed at [facility], [place] [address], [place], [place] [address]    Special Requests   Final    BOTTLES DRAWN AEROBIC AND ANAEROBIC Blood Culture adequate volume Performed at [facility], [place] [address] [place], [place] [address]    Culture   Final    NO GROWTH 2 DAYS Performed at [facility] [address], [place], [place] [address]    Report Status PENDING  Incomplete  Culture, blood (routine x 2)     Status: None (Preliminary result)   Collection Time: 04/24/19 11:41 AM   Specimen: BLOOD RIGHT ARM  Result Value Ref Range Status   Specimen Description   Final    BLOOD RIGHT ARM Performed at [facility], [address] [place], [place] [address]    Special Requests   Final    BOTTLES DRAWN AEROBIC AND ANAEROBIC Blood Culture adequate volume Performed at [facility], [place] [address], [place], [place] [address]    Culture   Final    NO GROWTH 2 DAYS Performed at [place] [address] [place], [place] [address]    Report Status PENDING  Incomplete  Expectorated sputum assessment w rflx to resp cult     Status: None   Collection Time: 04/25/19  6:38 PM   Specimen: Sputum  Result Value Ref Range Status   Specimen Description SPUTUM  Final   Special Requests NONE  Final   Sputum evaluation   Final    THIS SPECIMEN IS ACCEPTABLE FOR SPUTUM CULTURE Performed at [facility], [place] [address] [place], [place] [address]    Report Status 04/25/2019 FINAL  Final  Culture, respiratory     Status: None (Preliminary result)   Collection Time: 04/25/19  6:38 PM   Specimen: SPU  Result Value Ref Range Status   Specimen Description   Final    SPUTUM Performed at [place] [address] [place], [place] [address]    Special Requests   Final    NONE Reflexed from [phone] [phone] Performed at [place] [address] [place], [place] [address]    Gram Stain   Final    FEW WBC PRESENT, PREDOMINANTLY PMN MODERATE GRAM NEGATIVE RODS MODERATE GRAM POSITIVE COCCI IN CLUSTERS    Culture   Final    TOO YOUNG TO READ Performed at [facility], [place] [address], [place], [place] [address]    Report Status PENDING  Incomplete    Procedures and diagnostic studies:  CT ABDOMEN PELVIS WO CONTRAST  Result Date: 04/26/2019 CLINICAL DATA:  Fall, left hip pain. Anemia. Evaluate for retroperitoneal bleed. EXAM: CT ABDOMEN AND PELVIS WITHOUT CONTRAST TECHNIQUE: Multidetector CT imaging of the abdomen and pelvis was performed following the standard protocol without IV contrast. COMPARISON:  12/09/2009 FINDINGS:  Lower chest: Coronary artery calcifications. Heart is normal size. Bibasilar dependent atelectasis. Hepatobiliary: Calcifications in the liver compatible with old granulomatous disease. Prior cholecystectomy. No focal hepatic abnormality. Pancreas: Pancreatic atrophy. No focal abnormality or ductal dilatation. Spleen: No focal abnormality.  Normal size. Adrenals/Urinary Tract: Large cysts in the upper pole of the right kidney are unchanged since prior study. No hydronephrosis. No adrenal or urinary bladder visible abnormality. Stomach/Bowel: Sigmoid diverticulosis. Colonic diverticulosis, most notable in the sigmoid colon and right colon. No active diverticulitis. Stomach and small bowel decompressed. Vascular/Lymphatic: Aortic atherosclerosis. No enlarged abdominal or pelvic lymph nodes. Reproductive: Obscured by beam hardening artifact from bilateral hip replacements. Other: No free fluid or free air.  No retroperitoneal hematoma. Musculoskeletal: Bilateral hip replacements. No visible fracture. Degenerative changes in the lumbar spine. There is stranding within the subcutaneous soft tissues of the left hip and proximal thigh compatible with hematoma. IMPRESSION: No retroperitoneal hematoma. Stranding within the subcutaneous soft tissues overlying the left hip compatible with superficial hematoma. No visible bony abnormality. Colonic diverticulosis. Aortic atherosclerosis. Electronically Signed   By: Rolm Baptise M.D.   On: 04/26/2019 08:27    Medications:   . diltiazem  30 mg Oral Q6H  . feeding supplement  1 Container Oral Q24H  . feeding supplement (ENSURE ENLIVE)  237 mL Oral BID BM  . heparin injection (subcutaneous)  5,000 Units Subcutaneous  Q8H  . multivitamin with minerals  1 tablet Oral Daily  . mupirocin ointment  1 application Nasal BID  . rosuvastatin  10 mg Oral Daily   Continuous Infusions: . sodium chloride 100 mL/hr at 04/25/19 1854  . [START ON 04/27/2019] ampicillin (OMNIPEN) IV         LOS: 4 days   Paublo Warshawsky  [facility]   *Please refer to [email or web address], password [email or web address] to get updated schedule on who will round on this patient, as hospitalists switch teams weekly. If 7PM-7AM, please contact night-coverage at [email or web address], password [email or web address] for any overnight needs.  04/26/2019, 1:56 PM

## 2019-04-26 NOTE — Progress Notes (Signed)
Pharmacy Antibiotic Note  George [name] is a 85 y.o. male presented to the ED on 04/22/2019  with c/o left hip pain s/p fall.  Hip x-ray on 12/18 showed "significantly displaced periprosthetic fracture involving the left femoral diaphysis". Plan is to take patient to the OR on 12/23 for repair of fracture.  He was started on cefepime on 12/20 for suspected PNA.  CXR on 12/21 showed no acute findings.  UCX on 12/20 has >100K enterococcus faecalis.  Pharmacy was consulted on 12/22 to d/c cefepime and start vancomycin for UTI. After further discussion,  Antibiotics change to ampicillin per pharmacy.  Today, 04/26/2019: - afeb, wbc elevated but trending down - scr improving with 2.10 (crc~23)  Plan: - vancomycin loading dose given - Starting 12/23 am will start ampicillin 1gm IV q8h (delay start due to vancomycin being on board) - follow renal function closely __________________________________________  Height: 5\' 10"  (177.8 cm) Weight: 175 lb (79.4 kg) IBW/kg (Calculated) : 73  Temp (24hrs), Avg:97.9 F (36.6 C), Min:97.5 F (36.4 C), Max:98.1 F (36.7 C)  Recent Labs  Lab 04/22/19 2038 04/23/19 0618 04/24/19 0523 04/24/19 1919 04/25/19 0449 04/25/19 0632 04/26/19 0526  WBC 24.0* 16.9* 27.6*  --   --  18.5* 15.6*  CREATININE 1.10 1.33* 3.01* 3.07* QUESTIONABLE RESULTS, RECOMMEND RECOLLECT TO VERIFY 2.73* 2.10*    Estimated Creatinine Clearance: 22.7 mL/min (A) (by C-G formula based on SCr of 2.1 mg/dL (H)).    Allergies  Allergen Reactions  . Aspirin Other (See Comments)    bleeding    Antimicrobials this admission: 12/20 cefepime >> 12/22 12/22 vanc>>     Microbiology results: 12/20 BCx x2:  12/20 UCx: > 100K enterococcus faecalis (pans sens) FINAL 12/18 SARS-2: Negative 12/21 sputum cx: moderate GNR and GPC in clusters 12/20 MRSA PCR: neg    Thank you for allowing pharmacy to be a part of this patient's care.  Clovis Riley 04/26/2019 1:37 PM

## 2019-04-26 NOTE — Care Management Important Message (Signed)
Important Message  Patient Details IM Letter given to Dessa Phi RN Case Manager to present to the Patient Name: George [name] MRN: [id number] Date of Birth: August 30, 1923   Medicare Important Message Given:  Yes     Kerin Salen 04/26/2019, 9:56 AM

## 2019-04-26 NOTE — H&P (View-Only) (Signed)
Open reduction internal fixation left periprosthetic femur fracture scheduled for 2:20 PM on 04/27/2019. NPO at midnight. Consent in chart. Heparin for DVT prophylaxis in meantime - needs to be discontinued at a minimum of 6 hours prior to OR.  Theresa Duty, PA-C Orthopedic Surgery [facility]

## 2019-04-26 NOTE — Progress Notes (Signed)
CRITICAL VALUE STICKER  CRITICAL VALUE: Hgb 6.6  RECEIVER (on-site recipient of call): Summer RN reported to [place] NOTIFIED:  04/26/2019 around 0640  MD NOTIFIED: Maudie Mercury MD  TIME OF NOTIFICATION: 04/26/2019 @0651   RESPONSE: New orders to transfuse 1 unit RBC   0719- On coming RN made aware of labs and orders.  Norlene Duel RN, BSN

## 2019-04-26 NOTE — Progress Notes (Signed)
SLP Cancellation Note  Patient Details Name: George [name] MRN: [id number] DOB: July 30, 1923   Cancelled treatment:       Reason Eval/Treat Not Completed: Other (comment)(pt npo for surgery, will continue efforts)   Macario Golds 04/26/2019, 7:37 AM   Kathleen Lime, MS [place] Office [phone]

## 2019-04-27 ENCOUNTER — Encounter (HOSPITAL_COMMUNITY): Payer: Self-pay | Admitting: Internal Medicine

## 2019-04-27 ENCOUNTER — Encounter (HOSPITAL_COMMUNITY)
Admission: EM | Disposition: A | Discharge status: Skilled Nursing Facility | Payer: Self-pay | Source: Home / Self Care | Attending: Internal Medicine

## 2019-04-27 ENCOUNTER — Inpatient Hospital Stay (HOSPITAL_COMMUNITY): Payer: Medicare Other | Admitting: Certified Registered"

## 2019-04-27 ENCOUNTER — Inpatient Hospital Stay (HOSPITAL_COMMUNITY): Payer: Medicare Other

## 2019-04-27 DIAGNOSIS — E785 Hyperlipidemia, unspecified: Secondary | ICD-10-CM

## 2019-04-27 HISTORY — PX: TOTAL HIP REVISION: SHX763

## 2019-04-27 LAB — POCT I-STAT, CHEM 8
BUN: 43 mg/dL — ABNORMAL HIGH (ref 8–23)
BUN: 69 mg/dL — ABNORMAL HIGH (ref 8–23)
Calcium, Ion: 1.02 mmol/L — ABNORMAL LOW (ref 1.15–1.40)
Calcium, Ion: 1.13 mmol/L — ABNORMAL LOW (ref 1.15–1.40)
Chloride: 105 mmol/L (ref 98–111)
Chloride: 106 mmol/L (ref 98–111)
Creatinine, Ser: 1.6 mg/dL — ABNORMAL HIGH (ref 0.61–1.24)
Creatinine, Ser: 1.6 mg/dL — ABNORMAL HIGH (ref 0.61–1.24)
Glucose, Bld: 142 mg/dL — ABNORMAL HIGH (ref 70–99)
Glucose, Bld: 145 mg/dL — ABNORMAL HIGH (ref 70–99)
HCT: 30 % — ABNORMAL LOW (ref 39.0–52.0)
HCT: 31 % — ABNORMAL LOW (ref 39.0–52.0)
Hemoglobin: 10.2 g/dL — ABNORMAL LOW (ref 13.0–17.0)
Hemoglobin: 10.5 g/dL — ABNORMAL LOW (ref 13.0–17.0)
Potassium: 4.5 mmol/L (ref 3.5–5.1)
Potassium: 6.8 mmol/L (ref 3.5–5.1)
Sodium: 139 mmol/L (ref 135–145)
Sodium: 141 mmol/L (ref 135–145)
TCO2: 25 mmol/L (ref 22–32)
TCO2: 25 mmol/L (ref 22–32)

## 2019-04-27 LAB — CULTURE, RESPIRATORY W GRAM STAIN: Culture: NORMAL

## 2019-04-27 LAB — CBC
HCT: 26.7 % — ABNORMAL LOW (ref 39.0–52.0)
Hemoglobin: 8.5 g/dL — ABNORMAL LOW (ref 13.0–17.0)
MCH: 33.9 pg (ref 26.0–34.0)
MCHC: 31.8 g/dL (ref 30.0–36.0)
MCV: 106.4 fL — ABNORMAL HIGH (ref 80.0–100.0)
Platelets: 159 10*3/uL (ref 150–400)
RBC: 2.51 MIL/uL — ABNORMAL LOW (ref 4.22–5.81)
RDW: 17.2 % — ABNORMAL HIGH (ref 11.5–15.5)
WBC: 18 10*3/uL — ABNORMAL HIGH (ref 4.0–10.5)
nRBC: 1 % — ABNORMAL HIGH (ref 0.0–0.2)

## 2019-04-27 LAB — TYPE AND SCREEN
ABO/RH(D): O POS
Antibody Screen: NEGATIVE
Unit division: 0

## 2019-04-27 LAB — BPAM RBC
Blood Product Expiration Date: 202101232359
ISSUE DATE / TIME: 202012221332
Unit Type and Rh: 5100

## 2019-04-27 LAB — CREATININE, SERUM
Creatinine, Ser: 1.87 mg/dL — ABNORMAL HIGH (ref 0.61–1.24)
GFR calc Af Amer: 35 mL/min — ABNORMAL LOW (ref >60)
GFR calc non Af Amer: 30 mL/min — ABNORMAL LOW (ref >60)

## 2019-04-27 LAB — PREPARE RBC (CROSSMATCH)

## 2019-04-27 LAB — FOLATE RBC
Folate, Hemolysate: 394 ng/mL
Folate, RBC: 1655 ng/mL (ref >498)
Hematocrit: 23.8 % — ABNORMAL LOW (ref 37.5–51.0)

## 2019-04-27 SURGERY — TOTAL HIP REVISION
Anesthesia: Spinal | Site: Hip | Laterality: Left

## 2019-04-27 MED ORDER — DEXAMETHASONE SODIUM PHOSPHATE 10 MG/ML IJ SOLN
10.0000 mg | Freq: Once | INTRAMUSCULAR | Status: AC
  Administered 2019-04-28: 10 mg via INTRAVENOUS
  Filled 2019-04-27: qty 1

## 2019-04-27 MED ORDER — PHENYLEPHRINE HCL-NACL 10-0.9 MG/250ML-% IV SOLN: INTRAVENOUS | Status: DC | PRN

## 2019-04-27 MED ORDER — FENTANYL CITRATE (PF) 100 MCG/2ML IJ SOLN: 25.0000 ug | INTRAMUSCULAR | Status: DC | PRN

## 2019-04-27 MED ORDER — FLEET ENEMA 7-19 GM/118ML RE ENEM: 1.0000 | ENEMA | Freq: Once | RECTAL | Status: DC | PRN

## 2019-04-27 MED ORDER — BUPIVACAINE HCL (PF) 0.75 % IJ SOLN
INTRAMUSCULAR | Status: DC | PRN
  Administered 2019-04-27: 1.6 mL

## 2019-04-27 MED ORDER — FENTANYL CITRATE (PF) 100 MCG/2ML IJ SOLN
INTRAMUSCULAR | Status: AC
  Filled 2019-04-27: qty 2

## 2019-04-27 MED ORDER — PROMETHAZINE HCL 25 MG/ML IJ SOLN: 6.2500 mg | INTRAMUSCULAR | Status: DC | PRN

## 2019-04-27 MED ORDER — ONDANSETRON HCL 4 MG/2ML IJ SOLN
INTRAMUSCULAR | Status: DC | PRN
  Administered 2019-04-27: 4 mg via INTRAVENOUS

## 2019-04-27 MED ORDER — ACETAMINOPHEN 10 MG/ML IV SOLN: 1000.0000 mg | Freq: Once | INTRAVENOUS | Status: DC | PRN

## 2019-04-27 MED ORDER — SODIUM CHLORIDE 0.9 % IV SOLN: INTRAVENOUS | Status: DC | PRN

## 2019-04-27 MED ORDER — METOCLOPRAMIDE HCL 5 MG/ML IJ SOLN: 5.0000 mg | Freq: Three times a day (TID) | INTRAMUSCULAR | Status: DC | PRN

## 2019-04-27 MED ORDER — FENTANYL CITRATE (PF) 100 MCG/2ML IJ SOLN
INTRAMUSCULAR | Status: DC | PRN
  Administered 2019-04-27 (×2): 25 ug via INTRAVENOUS

## 2019-04-27 MED ORDER — PHENOL 1.4 % MT LIQD
1.0000 | OROMUCOSAL | Status: DC | PRN
  Filled 2019-04-27: qty 177

## 2019-04-27 MED ORDER — SODIUM CHLORIDE 0.9 % IV SOLN
INTRAVENOUS | Status: DC
  Administered 2019-04-27: 1000 mL via INTRAVENOUS

## 2019-04-27 MED ORDER — BUPIVACAINE HCL (PF) 0.25 % IJ SOLN: INTRAMUSCULAR | Status: DC | PRN

## 2019-04-27 MED ORDER — MIDAZOLAM HCL 2 MG/2ML IJ SOLN
INTRAMUSCULAR | Status: AC
  Filled 2019-04-27: qty 2

## 2019-04-27 MED ORDER — OXYCODONE HCL 5 MG PO TABS
5.0000 mg | ORAL_TABLET | ORAL | Status: DC | PRN
  Administered 2019-04-28 – 2019-05-02 (×5): 5 mg via ORAL
  Filled 2019-04-27 (×5): qty 1

## 2019-04-27 MED ORDER — LIDOCAINE 2% (20 MG/ML) 5 ML SYRINGE
INTRAMUSCULAR | Status: DC | PRN
  Administered 2019-04-27: 20 mg via INTRAVENOUS

## 2019-04-27 MED ORDER — ONDANSETRON HCL 4 MG/2ML IJ SOLN: 4.0000 mg | Freq: Four times a day (QID) | INTRAMUSCULAR | Status: DC | PRN

## 2019-04-27 MED ORDER — ALBUTEROL SULFATE (2.5 MG/3ML) 0.083% IN NEBU
INHALATION_SOLUTION | RESPIRATORY_TRACT | Status: AC
  Filled 2019-04-27: qty 3

## 2019-04-27 MED ORDER — MIDAZOLAM HCL 2 MG/2ML IJ SOLN: 1.0000 mg | INTRAMUSCULAR | Status: DC

## 2019-04-27 MED ORDER — METOCLOPRAMIDE HCL 5 MG PO TABS: 5.0000 mg | ORAL_TABLET | Freq: Three times a day (TID) | ORAL | Status: DC | PRN

## 2019-04-27 MED ORDER — PHENYLEPHRINE HCL-NACL 10-0.9 MG/250ML-% IV SOLN
INTRAVENOUS | Status: DC | PRN
  Administered 2019-04-27: 25 ug/min via INTRAVENOUS
  Administered 2019-04-27: 40 ug/min via INTRAVENOUS

## 2019-04-27 MED ORDER — METHOCARBAMOL 500 MG PO TABS
500.0000 mg | ORAL_TABLET | Freq: Four times a day (QID) | ORAL | Status: DC | PRN
  Administered 2019-04-27 – 2019-05-03 (×3): 500 mg via ORAL
  Filled 2019-04-27 (×3): qty 1

## 2019-04-27 MED ORDER — SODIUM CHLORIDE 0.9% IV SOLUTION: Freq: Once | INTRAVENOUS | Status: DC

## 2019-04-27 MED ORDER — PROPOFOL 10 MG/ML IV BOLUS
INTRAVENOUS | Status: AC
  Filled 2019-04-27: qty 20

## 2019-04-27 MED ORDER — PROPOFOL 500 MG/50ML IV EMUL
INTRAVENOUS | Status: DC | PRN
  Administered 2019-04-27: 25 ug/kg/min via INTRAVENOUS

## 2019-04-27 MED ORDER — METHOCARBAMOL 500 MG IVPB - SIMPLE MED
500.0000 mg | Freq: Four times a day (QID) | INTRAVENOUS | Status: DC | PRN
  Filled 2019-04-27: qty 50

## 2019-04-27 MED ORDER — APIXABAN 2.5 MG PO TABS
2.5000 mg | ORAL_TABLET | Freq: Two times a day (BID) | ORAL | Status: DC
  Administered 2019-04-28 – 2019-05-01 (×7): 2.5 mg via ORAL
  Filled 2019-04-27 (×7): qty 1

## 2019-04-27 MED ORDER — PROPOFOL 10 MG/ML IV BOLUS
INTRAVENOUS | Status: DC | PRN
  Administered 2019-04-27: 20 mg via INTRAVENOUS
  Administered 2019-04-27 (×2): 10 mg via INTRAVENOUS

## 2019-04-27 MED ORDER — DOCUSATE SODIUM 100 MG PO CAPS
100.0000 mg | ORAL_CAPSULE | Freq: Two times a day (BID) | ORAL | Status: DC
  Administered 2019-04-27 – 2019-05-04 (×13): 100 mg via ORAL
  Filled 2019-04-27 (×13): qty 1

## 2019-04-27 MED ORDER — HYDROMORPHONE HCL 1 MG/ML IJ SOLN
0.5000 mg | INTRAMUSCULAR | Status: DC | PRN
  Administered 2019-04-27: 1 mg via INTRAVENOUS
  Filled 2019-04-27: qty 1

## 2019-04-27 MED ORDER — ACETAMINOPHEN 325 MG PO TABS
325.0000 mg | ORAL_TABLET | Freq: Four times a day (QID) | ORAL | Status: DC | PRN
  Administered 2019-05-03 – 2019-05-04 (×2): 650 mg via ORAL
  Filled 2019-04-27 (×2): qty 2

## 2019-04-27 MED ORDER — LACTATED RINGERS IV SOLN: INTRAVENOUS | Status: DC

## 2019-04-27 MED ORDER — BISACODYL 10 MG RE SUPP: 10.0000 mg | Freq: Every day | RECTAL | Status: DC | PRN

## 2019-04-27 MED ORDER — DIPHENHYDRAMINE HCL 12.5 MG/5ML PO ELIX: 12.5000 mg | ORAL_SOLUTION | ORAL | Status: DC | PRN

## 2019-04-27 MED ORDER — METHOCARBAMOL 500 MG IVPB - SIMPLE MED
INTRAVENOUS | Status: AC
  Administered 2019-04-27: 17:00:00 500 mg via INTRAVENOUS
  Filled 2019-04-27: qty 50

## 2019-04-27 MED ORDER — POLYETHYLENE GLYCOL 3350 17 G PO PACK: 17.0000 g | PACK | Freq: Every day | ORAL | Status: DC | PRN

## 2019-04-27 MED ORDER — FENTANYL CITRATE (PF) 100 MCG/2ML IJ SOLN
50.0000 ug | INTRAMUSCULAR | Status: AC
  Administered 2019-04-27: 12.5 ug via INTRAVENOUS

## 2019-04-27 MED ORDER — MEPERIDINE HCL 50 MG/ML IJ SOLN: 6.2500 mg | INTRAMUSCULAR | Status: DC | PRN

## 2019-04-27 MED ORDER — SODIUM CHLORIDE 0.9 % IR SOLN
Status: DC | PRN
  Administered 2019-04-27: 1000 mL

## 2019-04-27 MED ORDER — ONDANSETRON HCL 4 MG PO TABS: 4.0000 mg | ORAL_TABLET | Freq: Four times a day (QID) | ORAL | Status: DC | PRN

## 2019-04-27 MED ORDER — OXYCODONE HCL 5 MG PO TABS
10.0000 mg | ORAL_TABLET | ORAL | Status: DC | PRN
  Administered 2019-04-27 – 2019-05-04 (×15): 10 mg via ORAL
  Filled 2019-04-27 (×15): qty 2

## 2019-04-27 MED ORDER — BUPIVACAINE HCL (PF) 0.25 % IJ SOLN
INTRAMUSCULAR | Status: DC | PRN
  Administered 2019-04-27 (×6): 5 mL

## 2019-04-27 MED ORDER — MENTHOL 3 MG MT LOZG: 1.0000 | LOZENGE | OROMUCOSAL | Status: DC | PRN

## 2019-04-27 SURGICAL SUPPLY — 76 items
BAG DECANTER FOR FLEXI CONT (MISCELLANEOUS) ×1 IMPLANT
BIT DRILL 2.8X128 (BIT) ×2 IMPLANT
BIT DRILL 2.8X128MM (BIT) ×1
BIT DRILL 4.3 (BIT) IMPLANT
BLADE EXTENDED COATED 6.5IN (ELECTRODE) ×3 IMPLANT
BLADE SAW SAG 73X25 THK (BLADE)
BLADE SAW SGTL 73X25 THK (BLADE) IMPLANT
CABLE CERLAGE W/CRIMP 1.8 (Cable) ×6 IMPLANT
CABLE CERLAGE W/CRIMP 1.8MM (Cable) ×6 IMPLANT
CLOSURE STERI-STRIP 1/2X4 (GAUZE/BANDAGES/DRESSINGS) ×1
CLOSURE WOUND 1/2 X4 (GAUZE/BANDAGES/DRESSINGS) ×3
CLSR STERI-STRIP ANTIMIC 1/2X4 (GAUZE/BANDAGES/DRESSINGS) ×1 IMPLANT
COVER SURGICAL LIGHT HANDLE (MISCELLANEOUS) ×3 IMPLANT
COVER WAND RF STERILE (DRAPES) IMPLANT
DRAPE INCISE IOBAN 66X45 STRL (DRAPES) ×3 IMPLANT
DRAPE ORTHO SPLIT 77X108 STRL (DRAPES) ×6
DRAPE POUCH INSTRU U-SHP 10X18 (DRAPES) ×3 IMPLANT
DRAPE SURG ORHT 6 SPLT 77X108 (DRAPES) ×2 IMPLANT
DRAPE U-SHAPE 47X51 STRL (DRAPES) ×3 IMPLANT
DRILL BIT 4.3 (BIT) ×3
DRSG EMULSION OIL 3X16 NADH (GAUZE/BANDAGES/DRESSINGS) ×3 IMPLANT
DRSG MEPILEX BORDER 4X12 (GAUZE/BANDAGES/DRESSINGS) ×2 IMPLANT
DRSG MEPILEX BORDER 4X4 (GAUZE/BANDAGES/DRESSINGS) ×2 IMPLANT
DRSG MEPILEX BORDER 4X8 (GAUZE/BANDAGES/DRESSINGS) ×3 IMPLANT
DURAPREP 26ML APPLICATOR (WOUND CARE) ×3 IMPLANT
ELECT REM PT RETURN 15FT ADLT (MISCELLANEOUS) ×3 IMPLANT
EVACUATOR 1/8 PVC DRAIN (DRAIN) ×3 IMPLANT
FACESHIELD WRAPAROUND (MASK) ×12 IMPLANT
FACESHIELD WRAPAROUND OR TEAM (MASK) ×4 IMPLANT
FEM CORT STRUT FROZEN (Tissue) ×3 IMPLANT
FEMORAL CORT STRUT FROZEN (Tissue) ×1 IMPLANT
GAUZE SPONGE 4X4 12PLY STRL (GAUZE/BANDAGES/DRESSINGS) ×1 IMPLANT
GLOVE BIO SURGEON STRL SZ7 (GLOVE) ×3 IMPLANT
GLOVE BIO SURGEON STRL SZ8 (GLOVE) ×3 IMPLANT
GLOVE BIOGEL PI IND STRL 7.0 (GLOVE) ×1 IMPLANT
GLOVE BIOGEL PI IND STRL 8 (GLOVE) ×1 IMPLANT
GLOVE BIOGEL PI INDICATOR 7.0 (GLOVE)
GLOVE BIOGEL PI INDICATOR 8 (GLOVE) ×2
GOWN STRL REUS W/TWL LRG LVL3 (GOWN DISPOSABLE) ×6 IMPLANT
GRAFT BNE STRUT 15X220 CRT FEM (Tissue) IMPLANT
HANDPIECE INTERPULSE COAX TIP (DISPOSABLE)
IMMOBILIZER KNEE 20 (SOFTGOODS) IMPLANT
IMMOBILIZER KNEE 20 THIGH 36 (SOFTGOODS) IMPLANT
KIT BASIN OR (CUSTOM PROCEDURE TRAY) ×3 IMPLANT
KIT TURNOVER KIT A (KITS) IMPLANT
LOCKPLATE CABLE BUTTON NCP HIP (Orthopedic Implant) ×4 IMPLANT
MANIFOLD NEPTUNE II (INSTRUMENTS) ×3 IMPLANT
MARKER SKIN DUAL TIP RULER LAB (MISCELLANEOUS) ×3 IMPLANT
NDL SAFETY ECLIPSE 18X1.5 (NEEDLE) ×1 IMPLANT
NEEDLE HYPO 18GX1.5 SHARP (NEEDLE) ×3
NS IRRIG 1000ML POUR BTL (IV SOLUTION) ×3 IMPLANT
PACK TOTAL JOINT (CUSTOM PROCEDURE TRAY) ×3 IMPLANT
PASSER SUT SWANSON 36MM LOOP (INSTRUMENTS) ×2 IMPLANT
PENCIL SMOKE EVACUATOR (MISCELLANEOUS) ×2 IMPLANT
PLATE DIST FEM 12H (Plate) ×2 IMPLANT
PROTECTOR NERVE ULNAR (MISCELLANEOUS) ×3 IMPLANT
SCREW NCB 5.0X36MM (Screw) ×6 IMPLANT
SCREW NCB 5.0X38 (Screw) ×2 IMPLANT
SET HNDPC FAN SPRY TIP SCT (DISPOSABLE) IMPLANT
SPONGE LAP 18X18 RF (DISPOSABLE) ×3 IMPLANT
STRIP CLOSURE SKIN 1/2X4 (GAUZE/BANDAGES/DRESSINGS) ×3 IMPLANT
SUCTION FRAZIER HANDLE 12FR (TUBING) ×2
SUCTION TUBE FRAZIER 12FR DISP (TUBING) ×1 IMPLANT
SUT ETHIBOND NAB CT1 #1 30IN (SUTURE) IMPLANT
SUT MNCRL AB 4-0 PS2 18 (SUTURE) ×2 IMPLANT
SUT STRATAFIX 0 PDS 27 VIOLET (SUTURE) ×3
SUT VIC AB 1 CT1 27 (SUTURE) ×9
SUT VIC AB 1 CT1 27XBRD ANTBC (SUTURE) ×3 IMPLANT
SUT VIC AB 2-0 CT1 27 (SUTURE) ×15
SUT VIC AB 2-0 CT1 TAPERPNT 27 (SUTURE) ×3 IMPLANT
SUTURE STRATFX 0 PDS 27 VIOLET (SUTURE) ×1 IMPLANT
SWAB CULTURE ESWAB REG 1ML (MISCELLANEOUS) IMPLANT
SYR 50ML LL SCALE MARK (SYRINGE) ×3 IMPLANT
TOWEL OR 17X26 10 PK STRL BLUE (TOWEL DISPOSABLE) ×6 IMPLANT
WATER STERILE IRR 1000ML POUR (IV SOLUTION) ×6 IMPLANT
YANKAUER SUCT BULB TIP 10FT TU (MISCELLANEOUS) ×3 IMPLANT

## 2019-04-27 NOTE — Progress Notes (Signed)
Assisted Dr.Hatchett with left femoral block. Side rails up, monitors on throughout procedure. See vital signs in flow sheet. Tolerated Procedure well.

## 2019-04-27 NOTE — Progress Notes (Signed)
SLP Cancellation Note  Patient Details Name: George [name] MRN: [id number] DOB: 08-Nov-1923   Cancelled treatment:       Reason Eval/Treat Not Completed: Medical issues which prohibited therapy(pt for surgery today will continue efforts)   Macario Golds 04/27/2019, 12:41 PM  Kathleen Lime, MS [place] Office [phone]

## 2019-04-27 NOTE — Transfer of Care (Signed)
Immediate Anesthesia Transfer of Care Note  Patient: George [name]  Procedure(s) Performed: LEFT PERIPROSTHETIC FEMUR FRACTURE WITH ZIMMER CABLE AND PLATE (Left Hip)  Patient Location: PACU  Anesthesia Type:Spinal  Level of Consciousness: sedated  Airway & Oxygen Therapy: Patient Spontanous Breathing and Patient connected to face mask oxygen  Post-op Assessment: Report given to RN and Post -op Vital signs reviewed and stable  Post vital signs: Reviewed and stable  Last Vitals:  Vitals Value Taken Time  BP 135/72 04/27/19 1615  Temp 36.4 C 04/27/19 1615  Pulse 73 04/27/19 1622  Resp 15 04/27/19 1622  SpO2 100 % 04/27/19 1622  Vitals shown include unvalidated device data.  Last Pain:  Vitals:   04/27/19 1159  TempSrc:   PainSc: 7       Patients Stated Pain Goal: 6 ([id number])  Complications: No apparent anesthesia complications

## 2019-04-27 NOTE — Interval H&P Note (Signed)
History and Physical Interval Note:  04/27/2019 11:54 AM  George [name]  has presented today for surgery, with the diagnosis of left hip periprosthetic fracture.  The various methods of treatment have been discussed with the patient and family. After consideration of risks, benefits and other options for treatment, the patient has consented to  Procedure(s): LEFT PERIPROSTHETIC FEMUR FRACTURE WITH Sylvarena (Left) as a surgical intervention.  The patient's history has been reviewed, patient examined, no change in status, stable for surgery.  I have reviewed the patient's chart and labs.  Questions were answered to the patient's satisfaction.     Pilar Plate Brenyn Petrey

## 2019-04-27 NOTE — Anesthesia Postprocedure Evaluation (Signed)
Anesthesia Post Note  Patient: QUENTYN CHEVALIER  Procedure(s) Performed: LEFT PERIPROSTHETIC FEMUR FRACTURE WITH ZIMMER CABLE AND PLATE (Left Hip)     Patient location during evaluation: PACU Anesthesia Type: Spinal Level of consciousness: awake Pain management: pain level controlled Respiratory status: spontaneous breathing Cardiovascular status: stable Postop Assessment: no headache, no backache, spinal receding, patient able to bend at knees and no apparent nausea or vomiting Anesthetic complications: no    Last Vitals:  Vitals:   04/27/19 1645 04/27/19 1700  BP: 134/68 135/73  Pulse: 73 79  Resp: 16 (!) 23  Temp:  (!) 36.4 C  SpO2: 99% 98%    Last Pain:  Vitals:   04/27/19 1700  TempSrc:   PainSc: 0-No pain   Pain Goal: Patients Stated Pain Goal: 6 (04/27/19 1159)  LLE Motor Response: Purposeful movement (04/27/19 1645) LLE Sensation: Decreased (04/27/19 1645) RLE Motor Response: Purposeful movement (04/27/19 1645) RLE Sensation: Full sensation (04/27/19 1645) L Sensory Level: S1-Sole of foot, small toes (04/27/19 1700) R Sensory Level: S1-Sole of foot, small toes (04/27/19 1700)    Huston Foley

## 2019-04-27 NOTE — Brief Op Note (Signed)
04/22/2019 - 04/27/2019  3:48 PM  PATIENT:  George [name]  85 y.o. male  PRE-OPERATIVE DIAGNOSIS:  left hip periprosthetic fracture  POST-OPERATIVE DIAGNOSIS:  left hip periprosthetic fracture  PROCEDURE:  Procedure(s): LEFT PERIPROSTHETIC FEMUR FRACTURE WITH ZIMMER CABLE AND PLATE (Left)  SURGEON:  Surgeon(s) and Role:    Gaynelle Arabian, MD - Primary  PHYSICIAN ASSISTANT:   ASSISTANTS: Griffith Citron, PA-C   ANESTHESIA:   spinal  EBL:  300 mL   BLOOD ADMINISTERED:none  DRAINS: none   LOCAL MEDICATIONS USED:  NONE  COUNTS:  YES  TOURNIQUET:  * No tourniquets in log *  DICTATION: .Other Dictation: Dictation Number [id number]  PLAN OF CARE: Admit to inpatient   PATIENT DISPOSITION:  PACU - hemodynamically stable.

## 2019-04-27 NOTE — Anesthesia Procedure Notes (Signed)
Date/Time: 04/27/2019 1:28 PM Performed by: Cynda Familia, CRNA Pre-anesthesia Checklist: Patient identified, Emergency Drugs available, Suction available, Patient being monitored and Timeout performed Patient Re-evaluated:Patient Re-evaluated prior to induction Oxygen Delivery Method: Simple face mask Placement Confirmation: positive ETCO2 and breath sounds checked- equal and bilateral Dental Injury: Teeth and Oropharynx as per pre-operative assessment

## 2019-04-27 NOTE — Anesthesia Procedure Notes (Addendum)
Anesthesia Regional Block: Fascia iliaca block   Pre-Anesthetic Checklist: ,, timeout performed, Correct Patient, Correct Site, Correct Laterality, Correct Procedure, Correct Position, site marked, Risks and benefits discussed,  Surgical consent,  Pre-op evaluation,  At surgeon's request and post-op pain management  Laterality: Left and Lower  Prep: chloraprep       Needles:  Injection technique: Single-shot  Needle Type: Echogenic Stimulator Needle     Needle Length: 9cm  Needle Gauge: 21   Needle insertion depth: 3 cm   Additional Needles:   Procedures:,,,, ultrasound used (permanent image in chart),,,,  Narrative:  Start time: 04/27/2019 1:10 PM End time: 04/27/2019 1:20 PM Injection made incrementally with aspirations every 5 mL.

## 2019-04-27 NOTE — Op Note (Signed)
NAME: George [name], George [name] MEDICAL RECORD [id number] ACCOUNT [id number] DATE OF BIRTH:Sep 27, 1923 FACILITY: [facility] LOCATION: [facility] PHYSICIAN:Kewanna Kasprzak Zella Ball, MD  OPERATIVE REPORT  DATE OF PROCEDURE:  04/27/2019  PREOPERATIVE DIAGNOSIS:  Left periprosthetic femur fracture.  POSTOPERATIVE DIAGNOSIS:  Left periprosthetic femur fracture.  PROCEDURE:  Open reduction internal fixation left periprosthetic femur fracture.  SURGEON:  Gaynelle Arabian, MD  ASSISTANT:  Griffith Citron, PA-C  ANESTHESIA:  Spinal.  ESTIMATED BLOOD LOSS:  300 mL.  DRAINS:  None.  COMPLICATIONS:  None.  CONDITION:  Stable to recovery.  BRIEF CLINICAL NOTE:  The patient is a 85 year old male on whom I performed a left total hip arthroplasty over 20 years ago.  He was doing fine, but then fell last weekend, sustaining a type 3 femoral periprosthetic fracture.  The stem is stable.  He  presents now for open reduction and internal fixation of this fracture.  PROCEDURE IN DETAIL:  After successful administration of spinal anesthetic, the patient was placed in the right lateral decubitus position with the left side up and held with a hip positioner.  The left lower extremity was isolated from his perineum with  plastic drapes and prepped and draped in the usual sterile fashion.  A long lateral incision was made with a 10 blade through subcutaneous tissue to the fascia lata, which was incised in line with the skin incision.  The fascia for the vastus lateralis  was incised and the muscle was peeled off the posterior intermuscular septum and elevated anteriorly to reveal the fracture.  Reduction clamps were placed, traction applied and the fracture reduced to near anatomic position.  It was held in place with a  2 fracture reduction clamps.  Two Zimmer cables were then passed around the femur and then they were tensioned, crimped and tightened and then the cables were cut.  This effectively held the fracture reduced.  I  then placed a cortical strut graft  anterior to the femur and we placed a Zimmer cable plate on the lateral aspect of the femur.  Two more cables were passed around the plate and strut graft, 1 proximal and 1 distal to the fracture to stabilize both the plate and the strut to the femur.   Four additional bicortical screws were then placed distal to the fracture through the plate and then 3 additional cables passed proximal to the fracture around the femur and the plate to hold it in place.  All of these cables were tensioned, tightened,  crimped and cut.  I was then moving the femur as a solitary unit and it was stable throughout flexion, extension and rotational maneuvers, as well as abduction and adduction.  It was all functioning as a solitary unit.  I was very pleased with the  reduction and the fixation.  The wound was then copiously irrigated with saline solution with pulsatile lavage.  The fascia of the vastus lateralis was closed with a running #1 Vicryl, fascia lata closed with a running 0 Stratafix,  subcutaneous closed  with interrupted 2-0 Vicryl and subcuticular running 4-0 Monocryl.  Incisions were cleaned and dried and Steri-Strips and a bulky sterile dressing were applied.  He was then awakened and transported to recovery in stable condition.  Please note that a surgical assistant was a medical necessity for this procedure.  Surgical assistance necessary for retraction of vital neurovascular structures, as well as for proper positioning of the limb for allowance of adequate reduction and  placement of the hardware in a safe and  accurate manner.  VN/NUANCE  D:04/27/2019 T:04/27/2019 JOB:[id number]

## 2019-04-27 NOTE — Progress Notes (Signed)
Progress Note    George [name]  [id number] DOB: [birth year]  DOA: 04/22/2019 PCP: Merrilee Seashore, MD         Assessment/Plan:   Principal Problem:   Hip fracture [facility]) Active Problems:   Chronic atrial fibrillation ([place])   HLD (hyperlipidemia)   AKI (acute kidney injury) ([place])    Left periprosthetic hip fracture s/p ORIF on 04/27/2019  Orthopedics on board  Pain management/DVT PPx as per orthopedics  PT/OT as per orthopedics  Acute blood loss anemia Hemoglobin dropped to 6.6, s/p transfused 1 unit of packed red blood cell on 12/22 ?? Transfuse during surgery Monitor closely post op Daily CBC  UTI 2/2 Enterococcus faecalis  Currently afebrile, with leukocytosis Urine culture showed Enterococcus faecalis Continue IV ampicillin Daily CBC, will trend  Acute kidney injury Improving s/p IVF, continue to hold Daily BMP  Chronic atrial fibrillation Rate controlled Continue cardizem Eliquis on hold due to recent surgery  HLD Continue crestor  History of prostate cancer Outpt follow up   Family Communication/Anticipated D/C date and plan/Code Status   DVT prophylaxis: SCD Code Status: DNR Family Communication: Plan discussed with the patient Disposition Plan: To be determined      Subjective:   Patient denies any new complaints, still reports some shortness of breath with mild wheezing noted.  Denies any chest pain, abdominal pain, nausea/vomiting, fever/chills.  Patient with tolerable left hip pain  Objective:    Vitals:   04/27/19 1626 04/27/19 1628 04/27/19 1630 04/27/19 1645  BP: (!) 145/90 (!) 148/72 139/81 134/68  Pulse:   68 73  Resp:   14 16  Temp:      TempSrc:      SpO2:   100% 99%  Weight:      Height:        Intake/Output Summary (Last 24 hours) at 04/27/2019 1648 Last data filed at 04/27/2019 1606 Gross per 24 hour  Intake 1990 ml  Output 1175 ml  Net 815 ml   Filed Weights   04/22/19 2200 04/27/19 1212    Weight: 79.4 kg 79.4 kg    Exam:  General: NAD   Cardiovascular: S1, S2 present  Respiratory: CTAB  Abdomen: Soft, nontender, nondistended, bowel sounds present  Musculoskeletal: No bilateral pedal edema noted  Skin: Normal  Psychiatry: Normal mood    Data Reviewed:   I have personally reviewed following labs and imaging studies:  Labs: Labs show the following:   Basic Metabolic Panel: Recent Labs  Lab 04/22/19 2038 04/23/19 0618 04/24/19 1919 04/25/19 0449 04/25/19 0632 04/26/19 0526 04/27/19 0442 04/27/19 1552 04/27/19 1613  NA 140 136 136 QUESTIONABLE RESULTS, RECOMMEND RECOLLECT TO VERIFY 136  --   --  139 141  K 4.8 4.8 5.0 QUESTIONABLE RESULTS, RECOMMEND RECOLLECT TO VERIFY 4.5  --   --  6.8* 4.5  CL 106 104 102 QUESTIONABLE RESULTS, RECOMMEND RECOLLECT TO VERIFY 103  --   --  106 105  CO2 22 21* 25 QUESTIONABLE RESULTS, RECOMMEND RECOLLECT TO VERIFY 25  --   --   --   --   GLUCOSE 174* 184* 172* QUESTIONABLE RESULTS, RECOMMEND RECOLLECT TO VERIFY 158*  --   --  145* 142*  BUN 19 25* 55* QUESTIONABLE RESULTS, RECOMMEND RECOLLECT TO VERIFY 56*  --   --  69* 43*  CREATININE 1.10 1.33* 3.07* QUESTIONABLE RESULTS, RECOMMEND RECOLLECT TO VERIFY 2.73* 2.10* 1.87* 1.60* 1.60*  CALCIUM 8.7* 8.6* 8.2* QUESTIONABLE RESULTS, RECOMMEND RECOLLECT TO VERIFY  8.0*  --   --   --   --   MG  --   --   --  QUESTIONABLE RESULTS, RECOMMEND RECOLLECT TO VERIFY  --   --   --   --   --   PHOS  --   --  2.9 QUESTIONABLE RESULTS, RECOMMEND RECOLLECT TO VERIFY 2.7  --   --   --   --    GFR Estimated Creatinine Clearance: 29.8 mL/min (A) (by C-G formula based on SCr of 1.6 mg/dL (H)). Liver Function Tests: Recent Labs  Lab 04/23/19 0618 04/24/19 1919 04/25/19 0449 04/25/19 0632  AST 139*  --   --   --   ALT 69*  --   --   --   ALKPHOS 78  --   --   --   BILITOT 1.8*  --   --   --   PROT 6.3*  --   --   --   ALBUMIN 3.4* 2.7* QUESTIONABLE RESULTS, RECOMMEND RECOLLECT TO  VERIFY 2.5*   No results for input(s): LIPASE, AMYLASE in the last 168 hours. No results for input(s): AMMONIA in the last 168 hours. Coagulation profile Recent Labs  Lab 04/22/19 2038  INR 1.1    CBC: Recent Labs  Lab 04/22/19 2038 04/23/19 0618 04/24/19 0523 04/25/19 0632 04/26/19 0526 04/26/19 1829 04/27/19 1017 04/27/19 1552 04/27/19 1613  WBC 24.0* 16.9* 27.6* 18.5* 15.6*  --  18.0*  --   --   NEUTROABS 21.1* 12.9*  --  15.3*  --   --   --   --   --   HGB 13.4 12.5* 10.1* 7.7* 6.6* 8.1* 8.5* 10.2* 10.5*  HCT 41.8 39.3 31.5* 24.2* 21.2* 25.3*  23.8* 26.7* 30.0* 31.0*  MCV 106.9* 106.5* 107.1* 109.5* 111.6*  --  106.4*  --   --   PLT 162 153 150 133* 130*  --  159  --   --    Cardiac Enzymes: No results for input(s): CKTOTAL, CKMB, CKMBINDEX, TROPONINI in the last 168 hours. BNP (last 3 results) No results for input(s): PROBNP in the last 8760 hours. CBG: Recent Labs  Lab 04/23/19 0756  GLUCAP 167*   D-Dimer: No results for input(s): DDIMER in the last 72 hours. Hgb A1c: No results for input(s): HGBA1C in the last 72 hours. Lipid Profile: No results for input(s): CHOL, HDL, LDLCALC, TRIG, CHOLHDL, LDLDIRECT in the last 72 hours. Thyroid function studies: No results for input(s): TSH, T4TOTAL, T3FREE, THYROIDAB in the last 72 hours.  Invalid input(s): FREET3 Anemia work up: Recent Labs    04/26/19 1829  VITAMINB12 316  FERRITIN 95  TIBC 203*  IRON 34*   Sepsis Labs: Recent Labs  Lab 04/24/19 0523 04/24/19 0606 04/25/19 0632 04/26/19 0526 04/27/19 1017  PROCALCITON  --  2.31  --   --   --   WBC 27.6*  --  18.5* 15.6* 18.0*    Microbiology Recent Results (from the past 240 hour(s))  SARS CORONAVIRUS 2 (TAT 6-24 HRS) Nasopharyngeal Nasopharyngeal Swab     Status: None   Collection Time: 04/22/19  8:38 PM   Specimen: Nasopharyngeal Swab  Result Value Ref Range Status   SARS Coronavirus 2 NEGATIVE NEGATIVE Final    Comment:  (NOTE) SARS-CoV-2 target nucleic acids are NOT DETECTED. The SARS-CoV-2 RNA is generally detectable in upper and lower respiratory specimens during the acute phase of infection. Negative results do not preclude SARS-CoV-2 infection, do not rule out  co-infections with other pathogens, and should not be used as the sole basis for treatment or other patient management decisions. Negative results must be combined with clinical observations, patient history, and epidemiological information. The expected result is Negative. Fact Sheet for Patients: SugarRoll.be Fact Sheet for Healthcare Providers: https://www.woods-mathews.com/ This test is not yet approved or cleared by the [place] FDA and  has been authorized for detection and/or diagnosis of SARS-CoV-2 by FDA under an Emergency Use Authorization (EUA). This EUA will remain  in effect (meaning this test can be used) for the duration of the COVID-19 declaration under Section 56 4(b)(1) of the Act, 21 U.S.C. section 360bbb-3(b)(1), unless the authorization is terminated or revoked sooner. Performed at [facility], [place] [address], [place], [place] [address]   Surgical PCR screen     Status: None   Collection Time: 04/24/19  9:00 AM   Specimen: Nasal Mucosa; Nasal Swab  Result Value Ref Range Status   MRSA, PCR NEGATIVE NEGATIVE Final   Staphylococcus aureus NEGATIVE NEGATIVE Final    Comment: (NOTE) The Xpert SA Assay (FDA approved for NASAL specimens in patients 45 years of age and older), is one component of a comprehensive surveillance program. It is not intended to diagnose infection nor to guide or monitor treatment. Performed at [facility], [place] [address] [place], [place] [address]   MRSA PCR Screening     Status: None   Collection Time: 04/24/19 10:29 AM   Specimen: Nasopharyngeal  Result Value Ref Range Status   MRSA by PCR NEGATIVE NEGATIVE Final     Comment:        The GeneXpert MRSA Assay (FDA approved for NASAL specimens only), is one component of a comprehensive MRSA colonization surveillance program. It is not intended to diagnose MRSA infection nor to guide or monitor treatment for MRSA infections. Performed at [facility], [place] [address], [place], [place] [address]   Culture, Urine     Status: Abnormal   Collection Time: 04/24/19 11:21 AM   Specimen: Urine, Clean Catch  Result Value Ref Range Status   Specimen Description   Final    URINE, CLEAN CATCH Performed at [facility], [place] [address] [place], [place] [address]    Special Requests   Final    NONE Performed at [facility], [place] [address], [place], [place] [address]    Culture >=100,000 COLONIES/mL ENTEROCOCCUS FAECALIS (A)  Final   Report Status 04/26/2019 FINAL  Final   Organism ID, Bacteria ENTEROCOCCUS FAECALIS (A)  Final      Susceptibility   Enterococcus faecalis - MIC*    AMPICILLIN <=2 SENSITIVE Sensitive     NITROFURANTOIN <=16 SENSITIVE Sensitive     VANCOMYCIN 1 SENSITIVE Sensitive     * >=100,000 COLONIES/mL ENTEROCOCCUS FAECALIS  Culture, blood (routine x 2)     Status: None (Preliminary result)   Collection Time: 04/24/19 11:35 AM   Specimen: BLOOD  Result Value Ref Range Status   Specimen Description   Final    BLOOD RIGHT ANTECUBITAL Performed at [facility], [place] [address] [place], [place] [address]    Special Requests   Final    BOTTLES DRAWN AEROBIC AND ANAEROBIC Blood Culture adequate volume Performed at [facility], [place] [address], [place], [place] [address]    Culture   Final    NO GROWTH 3 DAYS Performed at [facility], [place]  [address] [place], [place] [address]    Report Status PENDING  Incomplete  Culture, blood (routine x 2)     Status: None (Preliminary result)   Collection Time: 04/24/19 11:41 AM    Specimen: BLOOD RIGHT ARM  Result Value Ref Range Status   Specimen Description   Final    BLOOD RIGHT ARM Performed at [facility], [place] [address], [place], [place] [address]    Special Requests   Final    BOTTLES DRAWN AEROBIC AND ANAEROBIC Blood Culture adequate volume Performed at [facility], [place] [address] [place], [place] [address]    Culture   Final    NO GROWTH 3 DAYS Performed at [facility], [place] [address] [place], [place] [address]    Report Status PENDING  Incomplete  Expectorated sputum assessment w rflx to resp cult     Status: None   Collection Time: 04/25/19  6:38 PM   Specimen: Sputum  Result Value Ref Range Status   Specimen Description SPUTUM  Final   Special Requests NONE  Final   Sputum evaluation   Final    THIS SPECIMEN IS ACCEPTABLE FOR SPUTUM CULTURE Performed at [facility], [place] [address] [place], [place] [address]    Report Status 04/25/2019 FINAL  Final  Culture, respiratory     Status: None   Collection Time: 04/25/19  6:38 PM   Specimen: SPU  Result Value Ref Range Status   Specimen Description   Final    SPUTUM Performed at [place] [address] [place], [place] [address]    Special Requests   Final    NONE Reflexed from [phone] Performed at [place] [address], [place], [place] [address]    Gram Stain   Final    FEW WBC PRESENT, PREDOMINANTLY PMN MODERATE GRAM NEGATIVE RODS MODERATE GRAM POSITIVE COCCI IN CLUSTERS    Culture   Final    Consistent with normal respiratory flora. Performed at [facility], [place] [address], [place], [place] [address]    Report Status 04/27/2019 FINAL  Final    Procedures and diagnostic studies:  CT ABDOMEN PELVIS WO CONTRAST  Result Date: 04/26/2019 CLINICAL DATA:  Fall, left hip pain. Anemia. Evaluate for retroperitoneal bleed. EXAM: CT ABDOMEN AND PELVIS  WITHOUT CONTRAST TECHNIQUE: Multidetector CT imaging of the abdomen and pelvis was performed following the standard protocol without IV contrast. COMPARISON:  12/09/2009 FINDINGS: Lower chest: Coronary artery calcifications. Heart is normal size. Bibasilar dependent atelectasis. Hepatobiliary: Calcifications in the liver compatible with old granulomatous disease. Prior cholecystectomy. No focal hepatic abnormality. Pancreas: Pancreatic atrophy. No focal abnormality or ductal dilatation. Spleen: No focal abnormality.  Normal size. Adrenals/Urinary Tract: Large cysts in the upper pole of the right kidney are unchanged since prior study. No hydronephrosis. No adrenal or urinary bladder visible abnormality. Stomach/Bowel: Sigmoid diverticulosis. Colonic diverticulosis, most notable in the sigmoid colon and right colon. No active diverticulitis. Stomach and small bowel decompressed. Vascular/Lymphatic: Aortic atherosclerosis. No enlarged abdominal or pelvic lymph nodes. Reproductive: Obscured by beam hardening artifact from bilateral hip replacements. Other: No free fluid or free air.  No retroperitoneal hematoma. Musculoskeletal: Bilateral hip replacements. No visible fracture. Degenerative changes in the lumbar spine. There is stranding within the subcutaneous soft tissues of the left hip and proximal thigh compatible with hematoma. IMPRESSION: No retroperitoneal hematoma. Stranding within the subcutaneous soft tissues overlying the left hip compatible with  superficial hematoma. No visible bony abnormality. Colonic diverticulosis. Aortic atherosclerosis. Electronically Signed   By: Rolm Baptise M.D.   On: 04/26/2019 08:27    Medications:   . [MAR Hold] sodium chloride   Intravenous Once  . [MAR Hold] diltiazem  30 mg Oral Q6H  . [MAR Hold] feeding supplement  1 Container Oral Q24H  . [MAR Hold] feeding supplement (ENSURE ENLIVE)  237 mL Oral BID BM  . midazolam  1-2 mg Intravenous UD  . [MAR Hold]  multivitamin with minerals  1 tablet Oral Daily  . [MAR Hold] mupirocin ointment  1 application Nasal BID  . [MAR Hold] rosuvastatin  10 mg Oral Daily   Continuous Infusions: . sodium chloride    . acetaminophen    . [MAR Hold] ampicillin (OMNIPEN) IV 1 g (04/27/19 0548)  . lactated ringers 10 mL/hr at 04/27/19 1156  . methocarbamol (ROBAXIN) IV 500 mg (04/27/19 1644)     LOS: 5 days   Alma Friendly, MD  [facility]    04/27/2019, 4:48 PM

## 2019-04-27 NOTE — Anesthesia Procedure Notes (Signed)
Spinal  Patient location during procedure: OR Start time: 04/27/2019 1:42 PM End time: 04/27/2019 1:47 PM Staffing Performed: anesthesiologist  Anesthesiologist: Lyn Hollingshead, MD Preanesthetic Checklist Completed: patient identified, IV checked, site marked, risks and benefits discussed, surgical consent, monitors and equipment checked, pre-op evaluation and timeout performed Spinal Block Patient position: left lateral decubitus Prep: DuraPrep and site prepped and draped Patient monitoring: continuous pulse ox and blood pressure Approach: midline Location: L3-4 Injection technique: single-shot Needle Needle type: Pencan  Needle gauge: 24 G Needle length: 10 cm Needle insertion depth: 6 cm Assessment Sensory level: T8

## 2019-04-27 NOTE — Progress Notes (Signed)
Nutrition Follow-up  RD working remotely.   DOCUMENTATION CODES:   Not applicable  INTERVENTION:  - diet re-advancement as medically feasible. - continue Boost Breeze once/day and Ensure Enlive BID.    NUTRITION DIAGNOSIS:   Increased nutrient needs related to acute illness, post-op healing as evidenced by estimated needs. -ongoing  GOAL:   Patient will meet greater than or equal to 90% of their needs -progressing  MONITOR:   PO intake, Supplement acceptance, Labs, Weight trends  ASSESSMENT:   85 y.o. male with history of A. fib, hyperlipidemia, and prostate cancer. Patient presented to the ED with L hip pain after twisting it when he missed a step while walking to his garage. He did not fall.  Patient is currently out of the room to OR and had been made NPO at midnight. Per flow sheet documentation, he consumed 0% of breakfast and 100% of lunch and dinner on 12/21; 25% of breakfast on 12/22. Boost Breeze is ordered once/day and Ensure is ordered BID and he has accepted both of these supplements 100% of the time offered. Weight today documented as exactly the same as 12/18.   Per notes: - surgery today (12/23) for L peri-prosthetic hip fx - L hip superficial hematoma - acute blood loss anemia - AKI--slowly improving    Labs reviewed; creatinine: 1.87 mg/dl, GFR: 30 ml/min. Medications reviewed; 20 mg IV lasix x1 dose 12/22, daily multivitamin with minerals.     NUTRITION - FOCUSED PHYSICAL EXAM:  unable to complete at this time.   Diet Order:   Diet Order            Diet NPO time specified  Diet effective midnight              EDUCATION NEEDS:   No education needs have been identified at this time  Skin:  Skin Assessment: Reviewed RN Assessment  Last BM:  12/18  Height:   Ht Readings from Last 1 Encounters:  04/27/19 5\' 10"  (1.778 m)    Weight:   Wt Readings from Last 1 Encounters:  04/27/19 79.4 kg    Ideal Body Weight:  75.4 kg  BMI:   Body mass index is 25.12 kg/m.  Estimated Nutritional Needs:   Kcal:  1800-2000 kcal  Protein:  90-100 grams  Fluid:  >/= 2 L/day     Jarome Matin, MS, RD, LDN, [facility] Inpatient Clinical Dietitian Pager # [phone] After hours/weekend pager # [phone]

## 2019-04-27 NOTE — Anesthesia Preprocedure Evaluation (Addendum)
Anesthesia Evaluation  Patient identified by MRN, date of birth, ID band Patient awake    Reviewed: Allergy & Precautions, NPO status , Patient's Chart, lab work & pertinent test results  Airway Mallampati: II       Dental  (+) Poor Dentition   Pulmonary    Pulmonary exam normal breath sounds clear to auscultation       Cardiovascular  Rhythm:Regular Rate:Normal     Neuro/Psych negative psych ROS   GI/Hepatic   Endo/Other    Renal/GU Renal InsufficiencyRenal diseaseAKI     Musculoskeletal  (+) Arthritis , Osteoarthritis,    Abdominal   Peds  Hematology  (+) anemia ,   Anesthesia Other Findings ------------------------------------------------------------------- Left ventricle:  The cavity size was normal. Wall thickness was increased in a pattern of mild LVH. There was focal basal hypertrophy. Systolic function was normal. The estimated ejection fraction was in the range of 60% to 65%. Wall motion was normal; there were no regional wall motion abnormalities.   Reproductive/Obstetrics                            Anesthesia Physical Anesthesia Plan  ASA: III  Anesthesia Plan: Spinal   Post-op Pain Management:  Regional for Post-op pain   Induction:   PONV Risk Score and Plan: 1 and Ondansetron  Airway Management Planned: Nasal Cannula, Natural Airway and Simple Face Mask  Additional Equipment: None  Intra-op Plan:   Post-operative Plan:   Informed Consent: I have reviewed the patients History and Physical, chart, labs and discussed the procedure including the risks, benefits and alternatives for the proposed anesthesia with the patient or authorized representative who has indicated his/her understanding and acceptance.     Dental advisory given  Plan Discussed with: CRNA  Anesthesia Plan Comments:         Anesthesia Quick Evaluation

## 2019-04-28 ENCOUNTER — Inpatient Hospital Stay (HOSPITAL_COMMUNITY): Payer: Medicare Other

## 2019-04-28 DIAGNOSIS — R0600 Dyspnea, unspecified: Secondary | ICD-10-CM

## 2019-04-28 LAB — BASIC METABOLIC PANEL
Anion gap: 9 (ref 5–15)
BUN: 44 mg/dL — ABNORMAL HIGH (ref 8–23)
CO2: 24 mmol/L (ref 22–32)
Calcium: 8.1 mg/dL — ABNORMAL LOW (ref 8.9–10.3)
Chloride: 108 mmol/L (ref 98–111)
Creatinine, Ser: 1.56 mg/dL — ABNORMAL HIGH (ref 0.61–1.24)
GFR calc Af Amer: 44 mL/min — ABNORMAL LOW (ref >60)
GFR calc non Af Amer: 38 mL/min — ABNORMAL LOW (ref >60)
Glucose, Bld: 140 mg/dL — ABNORMAL HIGH (ref 70–99)
Potassium: 4.7 mmol/L (ref 3.5–5.1)
Sodium: 141 mmol/L (ref 135–145)

## 2019-04-28 LAB — BPAM RBC
Blood Product Expiration Date: 202101262359
Blood Product Expiration Date: 202101262359
ISSUE DATE / TIME: 202012231232
ISSUE DATE / TIME: 202012231232
Unit Type and Rh: 5100
Unit Type and Rh: 5100

## 2019-04-28 LAB — TYPE AND SCREEN
ABO/RH(D): O POS
Antibody Screen: NEGATIVE
Unit division: 0
Unit division: 0

## 2019-04-28 LAB — CBC WITH DIFFERENTIAL/PLATELET
Abs Immature Granulocytes: 0.45 10*3/uL — ABNORMAL HIGH (ref 0.00–0.07)
Basophils Absolute: 0.1 10*3/uL (ref 0.0–0.1)
Basophils Relative: 0 %
Eosinophils Absolute: 0.1 10*3/uL (ref 0.0–0.5)
Eosinophils Relative: 1 %
HCT: 32.9 % — ABNORMAL LOW (ref 39.0–52.0)
Hemoglobin: 10.5 g/dL — ABNORMAL LOW (ref 13.0–17.0)
Immature Granulocytes: 3 %
Lymphocytes Relative: 7 %
Lymphs Abs: 1.1 10*3/uL (ref 0.7–4.0)
MCH: 32 pg (ref 26.0–34.0)
MCHC: 31.9 g/dL (ref 30.0–36.0)
MCV: 100.3 fL — ABNORMAL HIGH (ref 80.0–100.0)
Monocytes Absolute: 2.4 10*3/uL — ABNORMAL HIGH (ref 0.1–1.0)
Monocytes Relative: 14 %
Neutro Abs: 12.6 10*3/uL — ABNORMAL HIGH (ref 1.7–7.7)
Neutrophils Relative %: 75 %
Platelets: 147 10*3/uL — ABNORMAL LOW (ref 150–400)
RBC: 3.28 MIL/uL — ABNORMAL LOW (ref 4.22–5.81)
RDW: 20.6 % — ABNORMAL HIGH (ref 11.5–15.5)
WBC: 16.6 10*3/uL — ABNORMAL HIGH (ref 4.0–10.5)
nRBC: 1 % — ABNORMAL HIGH (ref 0.0–0.2)

## 2019-04-28 LAB — ECHOCARDIOGRAM COMPLETE
Height: 70 in
Weight: 2800.72 oz

## 2019-04-28 LAB — BRAIN NATRIURETIC PEPTIDE: B Natriuretic Peptide: 306.6 pg/mL — ABNORMAL HIGH (ref 0.0–100.0)

## 2019-04-28 MED ORDER — OXYCODONE HCL 5 MG PO TABS: 5.0000 mg | ORAL_TABLET | Freq: Four times a day (QID) | ORAL | 0 refills | Status: AC | PRN

## 2019-04-28 MED ORDER — METHOCARBAMOL 500 MG PO TABS: 500.0000 mg | ORAL_TABLET | Freq: Four times a day (QID) | ORAL | 0 refills | Status: AC | PRN

## 2019-04-28 MED ORDER — AMOXICILLIN 250 MG PO CAPS
500.0000 mg | ORAL_CAPSULE | Freq: Three times a day (TID) | ORAL | Status: AC
  Administered 2019-04-28 – 2019-04-30 (×8): 500 mg via ORAL
  Filled 2019-04-28 (×9): qty 2

## 2019-04-28 NOTE — Progress Notes (Signed)
Progress Note    VANN MENDILLO  [id number] DOB: 02/22/1924  DOA: 04/22/2019 PCP: Merrilee Seashore, MD         Assessment/Plan:   Principal Problem:   Hip fracture [facility]) Active Problems:   Chronic atrial fibrillation ([place])   HLD (hyperlipidemia)   AKI (acute kidney injury) ([place])    Left periprosthetic hip fracture s/p ORIF on 04/27/2019  Orthopedics on board  Pain management/DVT PPx as per orthopedics  PT/OT as per orthopedics Noted leukocytosis likely reactive  Acute blood loss anemia Hemoglobin dropped to 6.6, s/p transfused 1 unit of packed red blood cell on 12/22 Patient also received 2 units of PRBC during surgery  Monitor closely post op Daily CBC  UTI 2/2 Enterococcus faecalis  Currently afebrile, with leukocytosis Urine culture showed Enterococcus faecalis Continue amoxicillin for total of 5 days Daily CBC, will trend  Acute kidney injury Improving s/p IVF, continue to hold Daily BMP  Chronic atrial fibrillation Rate controlled Continue cardizem Chest x-ray showed increased vascular congestion Echo showed EF of 65 to 70%, left ventricular diastolic function could not be evaluated Continue Eliquis  HLD Continue crestor  History of prostate cancer Outpt follow up   Family Communication/Anticipated D/C date and plan/Code Status   DVT prophylaxis: SCD Code Status: DNR Family Communication: Plan discussed with the patient Disposition Plan: To be determined      Subjective:   Patient seen and examined at bedside.  Eating breakfast, denies any new complaints except for minor postop pain, tolerable with pain meds.  Denies any chest pain, shortness of breath, abdominal pain, nausea/vomiting, fever/chills  Objective:    Vitals:   04/28/19 0100 04/28/19 0516 04/28/19 0939 04/28/19 1340  BP: (!) 155/73 (!) 148/75 139/69 137/67  Pulse: 83 75 85 84  Resp: 18 16 18 20   Temp: 98.1 F (36.7 C) 97.9 F (36.6 C) 98.1 F (36.7 C)  98.6 F (37 C)  TempSrc: Oral Oral Oral Oral  SpO2: 100% 97% 100% 94%  Weight:      Height:        Intake/Output Summary (Last 24 hours) at 04/28/2019 1653 Last data filed at 04/28/2019 1000 Gross per 24 hour  Intake 1150.09 ml  Output 550 ml  Net 600.09 ml   Filed Weights   04/22/19 2200 04/27/19 1212  Weight: 79.4 kg 79.4 kg    Exam:  General: NAD   Cardiovascular: S1, S2 present  Respiratory: CTAB  Abdomen: Soft, nontender, nondistended, bowel sounds present  Musculoskeletal: No bilateral pedal edema noted, LLE dressing C/D/I  Skin: Normal  Psychiatry: Normal mood     Data Reviewed:   I have personally reviewed following labs and imaging studies:  Labs: Labs show the following:   Basic Metabolic Panel: Recent Labs  Lab 04/23/19 0618 04/24/19 1919 04/25/19 0449 04/25/19 0632 04/26/19 0526 04/27/19 0442 04/27/19 1552 04/27/19 1613 04/28/19 0444  NA 136 136 QUESTIONABLE RESULTS, RECOMMEND RECOLLECT TO VERIFY 136  --   --  139 141 141  K 4.8 5.0 QUESTIONABLE RESULTS, RECOMMEND RECOLLECT TO VERIFY 4.5  --   --  6.8* 4.5 4.7  CL 104 102 QUESTIONABLE RESULTS, RECOMMEND RECOLLECT TO VERIFY 103  --   --  106 105 108  CO2 21* 25 QUESTIONABLE RESULTS, RECOMMEND RECOLLECT TO VERIFY 25  --   --   --   --  24  GLUCOSE 184* 172* QUESTIONABLE RESULTS, RECOMMEND RECOLLECT TO VERIFY 158*  --   --  145* 142*  140*  BUN 25* 55* QUESTIONABLE RESULTS, RECOMMEND RECOLLECT TO VERIFY 56*  --   --  69* 43* 44*  CREATININE 1.33* 3.07* QUESTIONABLE RESULTS, RECOMMEND RECOLLECT TO VERIFY 2.73* 2.10* 1.87* 1.60* 1.60* 1.56*  CALCIUM 8.6* 8.2* QUESTIONABLE RESULTS, RECOMMEND RECOLLECT TO VERIFY 8.0*  --   --   --   --  8.1*  MG  --   --  QUESTIONABLE RESULTS, RECOMMEND RECOLLECT TO VERIFY  --   --   --   --   --   --   PHOS  --  2.9 QUESTIONABLE RESULTS, RECOMMEND RECOLLECT TO VERIFY 2.7  --   --   --   --   --    GFR Estimated Creatinine Clearance: 30.5 mL/min (A) (by C-G  formula based on SCr of 1.56 mg/dL (H)). Liver Function Tests: Recent Labs  Lab 04/23/19 0618 04/24/19 1919 04/25/19 0449 04/25/19 0632  AST 139*  --   --   --   ALT 69*  --   --   --   ALKPHOS 78  --   --   --   BILITOT 1.8*  --   --   --   PROT 6.3*  --   --   --   ALBUMIN 3.4* 2.7* QUESTIONABLE RESULTS, RECOMMEND RECOLLECT TO VERIFY 2.5*   No results for input(s): LIPASE, AMYLASE in the last 168 hours. No results for input(s): AMMONIA in the last 168 hours. Coagulation profile Recent Labs  Lab 04/22/19 2038  INR 1.1    CBC: Recent Labs  Lab 04/22/19 2038 04/23/19 0618 04/24/19 0523 04/25/19 0632 04/26/19 0526 04/26/19 1829 04/27/19 1017 04/27/19 1552 04/27/19 1613 04/28/19 0444  WBC 24.0* 16.9* 27.6* 18.5* 15.6*  --  18.0*  --   --  16.6*  NEUTROABS 21.1* 12.9*  --  15.3*  --   --   --   --   --  12.6*  HGB 13.4 12.5* 10.1* 7.7* 6.6* 8.1* 8.5* 10.2* 10.5* 10.5*  HCT 41.8 39.3 31.5* 24.2* 21.2* 25.3*  23.8* 26.7* 30.0* 31.0* 32.9*  MCV 106.9* 106.5* 107.1* 109.5* 111.6*  --  106.4*  --   --  100.3*  PLT 162 153 150 133* 130*  --  159  --   --  147*   Cardiac Enzymes: No results for input(s): CKTOTAL, CKMB, CKMBINDEX, TROPONINI in the last 168 hours. BNP (last 3 results) No results for input(s): PROBNP in the last 8760 hours. CBG: Recent Labs  Lab 04/23/19 0756  GLUCAP 167*   D-Dimer: No results for input(s): DDIMER in the last 72 hours. Hgb A1c: No results for input(s): HGBA1C in the last 72 hours. Lipid Profile: No results for input(s): CHOL, HDL, LDLCALC, TRIG, CHOLHDL, LDLDIRECT in the last 72 hours. Thyroid function studies: No results for input(s): TSH, T4TOTAL, T3FREE, THYROIDAB in the last 72 hours.  Invalid input(s): FREET3 Anemia work up: Recent Labs    04/26/19 1829  VITAMINB12 316  FERRITIN 95  TIBC 203*  IRON 34*   Sepsis Labs: Recent Labs  Lab 04/24/19 0606 04/25/19 0632 04/26/19 0526 04/27/19 1017 04/28/19 0444    PROCALCITON 2.31  --   --   --   --   WBC  --  18.5* 15.6* 18.0* 16.6*    Microbiology Recent Results (from the past 240 hour(s))  SARS CORONAVIRUS 2 (TAT 6-24 HRS) Nasopharyngeal Nasopharyngeal Swab     Status: None   Collection Time: 04/22/19  8:38 PM   Specimen:  Nasopharyngeal Swab  Result Value Ref Range Status   SARS Coronavirus 2 NEGATIVE NEGATIVE Final    Comment: (NOTE) SARS-CoV-2 target nucleic acids are NOT DETECTED. The SARS-CoV-2 RNA is generally detectable in upper and lower respiratory specimens during the acute phase of infection. Negative results do not preclude SARS-CoV-2 infection, do not rule out co-infections with other pathogens, and should not be used as the sole basis for treatment or other patient management decisions. Negative results must be combined with clinical observations, patient history, and epidemiological information. The expected result is Negative. Fact Sheet for Patients: SugarRoll.be Fact Sheet for Healthcare Providers: https://www.woods-mathews.com/ This test is not yet approved or cleared by the [place] FDA and  has been authorized for detection and/or diagnosis of SARS-CoV-2 by FDA under an Emergency Use Authorization (EUA). This EUA will remain  in effect (meaning this test can be used) for the duration of the COVID-19 declaration under Section 56 4(b)(1) of the Act, 21 U.S.C. section 360bbb-3(b)(1), unless the authorization is terminated or revoked sooner. Performed at [facility], [place] [address], [place], [place] [address]   Surgical PCR screen     Status: None   Collection Time: 04/24/19  9:00 AM   Specimen: Nasal Mucosa; Nasal Swab  Result Value Ref Range Status   MRSA, PCR NEGATIVE NEGATIVE Final   Staphylococcus aureus NEGATIVE NEGATIVE Final    Comment: (NOTE) The Xpert SA Assay (FDA approved for NASAL specimens in patients 43 years of age and older), is one component of  a comprehensive surveillance program. It is not intended to diagnose infection nor to guide or monitor treatment. Performed at [facility], [place] [address], [place], [place] [address]   MRSA PCR Screening     Status: None   Collection Time: 04/24/19 10:29 AM   Specimen: Nasopharyngeal  Result Value Ref Range Status   MRSA by PCR NEGATIVE NEGATIVE Final    Comment:        The GeneXpert MRSA Assay (FDA approved for NASAL specimens only), is one component of a comprehensive MRSA colonization surveillance program. It is not intended to diagnose MRSA infection nor to guide or monitor treatment for MRSA infections. Performed at [facility], [place] [address], [place], [place] [address]   Culture, Urine     Status: Abnormal   Collection Time: 04/24/19 11:21 AM   Specimen: Urine, Clean Catch  Result Value Ref Range Status   Specimen Description   Final    URINE, CLEAN CATCH Performed at [facility], [place] [address] [place], [place] [address]    Special Requests   Final    NONE Performed at [facility], [place] [address] [place], [place] [address]    Culture >=100,000 COLONIES/mL ENTEROCOCCUS FAECALIS (A)  Final   Report Status 04/26/2019 FINAL  Final   Organism ID, Bacteria ENTEROCOCCUS FAECALIS (A)  Final      Susceptibility   Enterococcus faecalis - MIC*    AMPICILLIN <=2 SENSITIVE Sensitive     NITROFURANTOIN <=16 SENSITIVE Sensitive     VANCOMYCIN 1 SENSITIVE Sensitive     * >=100,000 COLONIES/mL ENTEROCOCCUS FAECALIS  Culture, blood (routine x 2)     Status: None (Preliminary result)   Collection Time: 04/24/19 11:35 AM   Specimen: BLOOD  Result Value Ref Range Status   Specimen Description   Final    BLOOD RIGHT ANTECUBITAL Performed at [facility], [place] [address] [place], [place]  [address]    Special Requests   Final    BOTTLES DRAWN AEROBIC AND ANAEROBIC Blood  Culture adequate volume Performed at [facility], [place] [address] [place], [place] [address]    Culture   Final    NO GROWTH 4 DAYS Performed at [facility], [place] [address] [place], [place] [address]    Report Status PENDING  Incomplete  Culture, blood (routine x 2)     Status: None (Preliminary result)   Collection Time: 04/24/19 11:41 AM   Specimen: BLOOD RIGHT ARM  Result Value Ref Range Status   Specimen Description   Final    BLOOD RIGHT ARM Performed at [facility], [place] [address], [place], [place] [address]    Special Requests   Final    BOTTLES DRAWN AEROBIC AND ANAEROBIC Blood Culture adequate volume Performed at [facility], [place] [address] [place], [place] [address]    Culture   Final    NO GROWTH 4 DAYS Performed at [facility], [place] [address] [place], [place] [address]    Report Status PENDING  Incomplete  Expectorated sputum assessment w rflx to resp cult     Status: None   Collection Time: 04/25/19  6:38 PM   Specimen: Sputum  Result Value Ref Range Status   Specimen Description SPUTUM  Final   Special Requests NONE  Final   Sputum evaluation   Final    THIS SPECIMEN IS ACCEPTABLE FOR SPUTUM CULTURE Performed at [facility], [place] [address] [place], [place] [address]    Report Status 04/25/2019 FINAL  Final  Culture, respiratory     Status: None   Collection Time: 04/25/19  6:38 PM   Specimen: SPU  Result Value Ref Range Status   Specimen Description   Final    SPUTUM Performed at [place] [address], [place], [place] [address]    Special Requests   Final    NONE Reflexed from [phone] Performed at [place] [address] [place], [place] [address]    Gram Stain   Final    FEW WBC PRESENT, PREDOMINANTLY PMN MODERATE GRAM NEGATIVE RODS MODERATE GRAM POSITIVE COCCI IN CLUSTERS    Culture    Final    Consistent with normal respiratory flora. Performed at [facility], [place] [address], [place],  [address]    Report Status 04/27/2019 FINAL  Final    Procedures and diagnostic studies:  DG Chest Port 1 View  Result Date: 04/27/2019 CLINICAL DATA:  Shortness of breath post femur fracture fixation/ORIF. EXAM: PORTABLE CHEST 1 VIEW COMPARISON:  Radiographs 04/24/2019 and 04/22/2019. FINDINGS: 1718 hours. The heart size and mediastinal contours are stable. There is increased vascular congestion without overt pulmonary edema. Asymmetric left basilar atelectasis appears slightly worse. There is no pneumothorax or significant pleural effusion. The bones appear unchanged. IMPRESSION: Increased vascular congestion without overt pulmonary edema. Mildly increased left basilar atelectasis. Electronically Signed   By: Richardean Sale M.D.   On: 04/27/2019 17:44   ECHOCARDIOGRAM COMPLETE  Result Date: 04/28/2019   ECHOCARDIOGRAM REPORT   Patient Name:   CORDARO RENSBERGER Date of Exam: 04/28/2019 Medical Rec #:  [id number]        Height:       70.0 in Accession #:    [id number]       Weight:       175.0 lb  Date of Birth:  19-Aug-1923        BSA:          1.97 m Patient Age:    60 years         BP:           137/67 mmHg Patient Gender: M                HR:           84 bpm. Exam Location:  Inpatient Procedure: 2D Echo Indications:    dyspnea 786.09  History:        Patient has prior history of Echocardiogram examinations, most                 recent 12/15/2014. Arrythmias:Atrial Fibrillation; Risk                 Factors:Dyslipidemia.  Sonographer:    Jannett Celestine RDCS (AE) Referring Phys: [id number] [place]  Sonographer Comments: Technically difficult study due to poor echo windows, suboptimal parasternal window, suboptimal apical window and suboptimal subcostal window. limited mobility due to leg injury IMPRESSIONS  1. Left ventricular ejection fraction, by visual estimation, is 65 to  70%. The left ventricle has hyperdynamic function. There is mildly increased left ventricular hypertrophy.  2. Left ventricular diastolic function could not be evaluated.  3. The left ventricle has no regional wall motion abnormalities.  4. Global right ventricle has normal systolic function.The right ventricular size is normal. No increase in right ventricular wall thickness.  5. Left atrial size was mildly dilated.  6. Right atrial size was normal.  7. The mitral valve is normal in structure. No evidence of mitral valve regurgitation. No evidence of mitral stenosis.  8. The tricuspid valve is grossly normal.  9. The aortic valve is normal in structure. Aortic valve regurgitation is not visualized. No evidence of aortic valve sclerosis or stenosis. 10. The pulmonic valve was not well visualized. Pulmonic valve regurgitation is not visualized. 11. TR signal is inadequate for assessing pulmonary artery systolic pressure. 12. The inferior vena cava is normal in size with greater than 50% respiratory variability, suggesting right atrial pressure of 3 mmHg. FINDINGS  Left Ventricle: Left ventricular ejection fraction, by visual estimation, is 65 to 70%. The left ventricle has hyperdynamic function. The left ventricle has no regional wall motion abnormalities. There is mildly increased left ventricular hypertrophy. Concentric left ventricular hypertrophy. The left ventricular diastology could not be evaluated due to atrial fibrillation. Left ventricular diastolic function could not be evaluated. Normal left atrial pressure. Right Ventricle: The right ventricular size is normal. No increase in right ventricular wall thickness. Global RV systolic function is has normal systolic function. Left Atrium: Left atrial size was mildly dilated. Right Atrium: Right atrial size was normal in size Pericardium: There is no evidence of pericardial effusion. Mitral Valve: The mitral valve is normal in structure. No evidence of mitral  valve regurgitation. No evidence of mitral valve stenosis by observation. Tricuspid Valve: The tricuspid valve is grossly normal. Tricuspid valve regurgitation is not demonstrated. Aortic Valve: The aortic valve is normal in structure. Aortic valve regurgitation is not visualized. The aortic valve is structurally normal, with no evidence of sclerosis or stenosis. Pulmonic Valve: The pulmonic valve was not well visualized. Pulmonic valve regurgitation is not visualized. Pulmonic regurgitation is not visualized. Aorta: The aortic root, ascending aorta and aortic arch are all structurally normal, with no evidence of dilitation or obstruction. Venous: The inferior vena  cava is normal in size with greater than 50% respiratory variability, suggesting right atrial pressure of 3 mmHg. IAS/Shunts: No atrial level shunt detected by color flow Doppler. There is no evidence of a patent foramen ovale. No ventricular septal defect is seen or detected. There is no evidence of an atrial septal defect.  LEFT VENTRICLE PLAX 2D LVIDd:         2.20 cm LVIDs:         1.90 cm LV PW:         1.30 cm LV IVS:        1.30 cm LVOT diam:     2.20 cm LV SV:         5 ml LV SV Index:   2.53 LVOT Area:     3.80 cm  RIGHT VENTRICLE TAPSE (M-mode): 1.5 cm LEFT ATRIUM             Index       RIGHT ATRIUM           Index LA diam:        4.20 cm 2.13 cm/m  RA Area:     15.20 cm LA Vol (A2C):   45.3 ml 22.97 ml/m RA Volume:   40.30 ml  20.43 ml/m LA Vol (A4C):   64.9 ml 32.90 ml/m LA Biplane Vol: 59.1 ml 29.96 ml/m  AORTIC VALVE LVOT Vmax:   92.80 cm/s LVOT Vmean:  61.400 cm/s LVOT VTI:    0.165 m  AORTA Ao Root diam: 3.60 cm MITRAL VALVE MV Area (PHT): 4.36 cm             SHUNTS MV PHT:        50.46 msec           Systemic VTI:  0.16 m MV Decel Time: 174 msec             Systemic Diam: 2.20 cm MV E velocity: 124.00 cm/s 103 cm/s  Mihai Croitoru MD Electronically signed by Sanda Klein MD Signature Date/Time: 04/28/2019/3:34:26 PM    Final       Medications:   . amoxicillin  500 mg Oral Q8H  . apixaban  2.5 mg Oral Q12H  . diltiazem  30 mg Oral Q6H  . docusate sodium  100 mg Oral BID  . feeding supplement  1 Container Oral Q24H  . feeding supplement (ENSURE ENLIVE)  237 mL Oral BID BM  . multivitamin with minerals  1 tablet Oral Daily  . rosuvastatin  10 mg Oral Daily   Continuous Infusions: . methocarbamol (ROBAXIN) IV 500 mg (04/27/19 1644)     LOS: 6 days   Alma Friendly, MD  [facility]    04/28/2019, 4:53 PM

## 2019-04-28 NOTE — Evaluation (Signed)
Physical Therapy Evaluation Patient Details Name: George [name] MRN: [id number] DOB: December 25, 1923 Today's Date: 04/28/2019   History of Present Illness  Pt is 85 yo male with history of afib, hyperlipidemia, OA, osteoporosis, and prostate CA (see full PMH in H and P) who presented to the ED with L hip pain after twisting it while walking (did not fall).  Pt found to have L periprosthetic femur fracture and is now  s/p ORIF on 04/27/19.  Clinical Impression  Patient is s/p above surgery on POD #1 resulting in functional limitations due to the deficits listed below (see PT Problem List). Pt was limited by some confusion and inability to maintain PWB status.  He was questionable historian but reported independent at baseline.  Pt currently requiring max x 2 for stand pivot and unable to take steps.  Patient will benefit from skilled PT to increase their independence and safety with mobility to allow discharge to the venue listed below.       Follow Up Recommendations SNF    Equipment Recommendations  Other (comment)(TBD next venue)    Recommendations for Other Services       Precautions / Restrictions Precautions Precautions: Fall Required Braces or Orthoses: Knee Immobilizer - Left Knee Immobilizer - Left: On at all times Restrictions Weight Bearing Restrictions: Yes LLE Weight Bearing: Partial weight bearing LLE Partial Weight Bearing Percentage or Pounds: 25%      Mobility  Bed Mobility Overal bed mobility: Needs Assistance Bed Mobility: Supine to Sit     Supine to sit: Max assist;+2 for physical assistance     General bed mobility comments: assist for legs and to elevated trunk  Transfers Overall transfer level: Needs assistance Equipment used: Rolling walker (2 wheeled) Transfers: Sit to/from [facility] Sit to Stand: Max assist;+2 physical assistance;From elevated surface Stand pivot transfers: Max assist;+2 physical assistance;From elevated  surface       General transfer comment: cues for safety and L LE placed forward to limit weight bearing  Ambulation/Gait             General Gait Details: deferred: unable physically and unable to maintain PWB  Stairs            Wheelchair Mobility    Modified Rankin (Stroke Patients Only)       Balance Overall balance assessment: Needs assistance Sitting-balance support: Bilateral upper extremity supported;Feet supported Sitting balance-Leahy Scale: Fair     Standing balance support: Bilateral upper extremity supported;During functional activity Standing balance-Leahy Scale: Zero                               Pertinent Vitals/Pain Pain Assessment: Faces Faces Pain Scale: Hurts even more Pain Location: L hip/leg -with transfers Pain Descriptors / Indicators: Grimacing;Discomfort Pain Intervention(s): Limited activity within patient's tolerance;Premedicated before session;Repositioned    Home Living Family/patient expects to be discharged to:: Skilled nursing facility Living Arrangements: Alone   Type of Home: House Home Access: Stairs to enter Entrance Stairs-Rails: None Entrance Stairs-Number of Steps: 1 Home Layout: One level Home Equipment: Shower seat - built in;Walker - 2 wheels;Cane - single point      Prior Function Level of Independence: Needs assistance   Gait / Transfers Assistance Needed: Uses RW and could ambulate in home; reports could walk around grocery store holding cart (had assist loading)  ADL's / Homemaking Assistance Needed: Pt reports could do ADLs; reports did sponge baths; could microwave  meals or ate with friends; could drive  Comments: Questionable historian     Hand Dominance        Extremity/Trunk Assessment   Upper Extremity Assessment Upper Extremity Assessment: Difficult to assess due to impaired cognition(demonstrating at least 3/5 throughout but unable to follow MMT)    Lower Extremity  Assessment Lower Extremity Assessment: Difficult to assess due to impaired cognition;RLE deficits/detail;LLE deficits/detail RLE Deficits / Details: demonstrating at least 3/5 throughout but unable to follow MMT LLE Deficits / Details: L ankle at least 3/5 MMT; L knee in immobilizer; L hip 1/5; unable to follow further commands    Cervical / Trunk Assessment Cervical / Trunk Assessment: Normal  Communication   Communication: HOH  Cognition Arousal/Alertness: Awake/alert Behavior During Therapy: WFL for tasks assessed/performed Overall Cognitive Status: No family/caregiver present to determine baseline cognitive functioning Area of Impairment: Orientation;Following commands;Safety/judgement;Problem solving                 Orientation Level: Time     Following Commands: Follows one step commands inconsistently Safety/Judgement: Decreased awareness of safety   Problem Solving: Slow processing;Difficulty sequencing;Requires verbal cues;Requires tactile cues        General Comments General comments (skin integrity, edema, etc.): On 2 LPM O2 sats 96-98%; HR 88-95 bpm; BP 152/74 in chiar    Exercises     Assessment/Plan    PT Assessment Patient needs continued PT services  PT Problem List Decreased strength;Decreased mobility;Decreased range of motion;Decreased knowledge of precautions;Decreased activity tolerance;Decreased balance;Cardiopulmonary status limiting activity;Decreased knowledge of use of DME       PT Treatment Interventions DME instruction;Therapeutic activities;Modalities;Gait training;Therapeutic exercise;Patient/family education;Balance training;Functional mobility training    PT Goals (Current goals can be found in the Care Plan section)  Acute Rehab PT Goals Patient Stated Goal: unable to state PT Goal Formulation: Patient unable to participate in goal setting Time For Goal Achievement: 05/12/19 Potential to Achieve Goals: Good    Frequency Min  3X/week   Barriers to discharge Decreased caregiver support      Co-evaluation               AM-PAC PT "6 Clicks" Mobility  Outcome Measure Help needed turning from your back to your side while in a flat bed without using bedrails?: Total Help needed moving from lying on your back to sitting on the side of a flat bed without using bedrails?: Total Help needed moving to and from a bed to a chair (including a wheelchair)?: Total Help needed standing up from a chair using your arms (e.g., wheelchair or bedside chair)?: Total Help needed to walk in hospital room?: Total Help needed climbing 3-5 steps with a railing? : Total 6 Click Score: 6    End of Session Equipment Utilized During Treatment: Gait belt;Left knee immobilizer Activity Tolerance: Patient tolerated treatment well Patient left: with chair alarm set;in chair;with call bell/phone within reach Nurse Communication: Mobility status;Need for lift equipment;Precautions;Weight bearing status(white board and verbal communication; hoyer pad under pt) PT Visit Diagnosis: Unsteadiness on feet (R26.81);Muscle weakness (generalized) (M62.81);Other abnormalities of gait and mobility (R26.89)    Time: [id number] PT Time Calculation (min) (ACUTE ONLY): 30 min   Charges:   PT Evaluation $PT Eval Moderate Complexity: 1 Mod          Maggie Font, PT Acute Rehab Services Pager [phone] [facility] [phone] [facility] [phone]   George [name] 04/28/2019, 10:59 AM

## 2019-04-28 NOTE — Progress Notes (Signed)
  Echocardiogram 2D Echocardiogram has been performed.  Jannett Celestine 04/28/2019, 3:27 PM

## 2019-04-28 NOTE — Progress Notes (Signed)
  Speech Language Pathology Treatment: Dysphagia  Patient Details Name: TYCHO VANDEWATER MRN: [id number] DOB: [birth year] Today's Date: 04/28/2019 Time: [id number] SLP Time Calculation (min) (ACUTE ONLY): 18 min  Assessment / Plan / Recommendation Clinical Impression  Skilled observation with intake of thin via cup/straw, puree and soft solids with delayed cough and belching noted intermittently during intake; pt utilized small sips/bites and slow rate independently and stated he uses liquid wash at home prn.  Recommend downgrading to mechanical soft/Dysphagia 3 diet with thin liquids for easier transition with swallowing paired with esophageal precautions during intake; ST will f/u x1 prn for diet tolerance with Dysphagia 3/thin as able.  HPI HPI: Patient is a 85 year old Caucasian male with past medical history significant for A. fib, hyperlipidemia, reflux, hiatal hernia, esophageal dilation, and prostate cancer. Patient was admitted with left periprosthetic femur fracture following a fall. Patient is awaiting an open reduction internal fixation of the left periprosthetic fracture that should be occurring Monday and/or Tuesday, as per orthopedic surgery team; pt c/o esophageal issues with swallowing stating "If gravity is in my favor, I'm alright."      SLP Plan  Continue with current plan of care       Recommendations  Diet recommendations: Dysphagia 3 (mechanical soft);Thin liquid Liquids provided via: Cup;Straw Medication Administration: Whole meds with liquid Supervision: Patient able to self feed Compensations: Slow rate;Small sips/bites;Effortful swallow;Clear throat intermittently;Multiple dry swallows after each bite/sip Postural Changes and/or Swallow Maneuvers: Seated upright 90 degrees;Upright 30-60 min after meal                Oral Care Recommendations: Oral care BID Follow up Recommendations: Other (comment)(TBD) SLP Visit Diagnosis: Dysphagia, unspecified  (R13.10) Plan: Continue with current plan of care                      Elvina Sidle, M.S., CCC-SLP 04/28/2019, 11:45 AM

## 2019-04-28 NOTE — Progress Notes (Signed)
  04/28/2019 1 Day Post-Op Procedure(s) (LRB): LEFT PERIPROSTHETIC FEMUR FRACTURE WITH ZIMMER CABLE AND PLATE (Left) Patient reports pain as mild.   Patient seen in rounds for Dr. Wynelle Link. Patient is well, and has had no acute complaints or problems other than pain in the left leg. States pain medications are working. Denies chest pain or SOB. They will be PWB to the left (25%).  Vital signs in last 24 hours: Temp:  [97.5 F (36.4 C)-98.6 F (37 C)] 97.9 F (36.6 C) (12/24 0516) Pulse Rate:  [68-86] 75 (12/24 0516) Resp:  [14-33] 16 (12/24 0516) BP: (118-155)/(61-90) 148/75 (12/24 0516) SpO2:  [94 %-100 %] 97 % (12/24 0516) Weight:  [79.4 kg] 79.4 kg (12/23 1212)  I&O's: I/O last 3 completed shifts: In: 2840.1 [P.O.:759; I.V.:1251.1; Blood:630; IV Piggyback:200] Out: [id number] [Urine:1425; Blood:300] No intake/output data recorded.  Labs: Recent Labs    04/26/19 1829 04/27/19 1017 04/27/19 1552 04/27/19 1613 04/28/19 0444  HGB 8.1* 8.5* 10.2* 10.5* 10.5*   Recent Labs    04/27/19 1017 04/27/19 1613 04/28/19 0444  WBC 18.0*  --  16.6*  RBC 2.51*  --  3.28*  HCT 26.7* 31.0* 32.9*  PLT 159  --  147*   Recent Labs    04/27/19 1613 04/28/19 0444  NA 141 141  K 4.5 4.7  CL 105 108  CO2  --  24  BUN 43* 44*  CREATININE 1.60* 1.56*  GLUCOSE 142* 140*  CALCIUM  --  8.1*   Exam: General - Patient is Alert and Oriented Extremity - Neurologically intact Neurovascular intact Sensation intact distally Dorsiflexion/Plantar flexion intact Dressing - dressing C/D/I Motor Function - intact, moving foot and toes well on exam.   Past Medical History:  Diagnosis Date  . AKI (acute kidney injury) ([place]) 04/25/2019  . BPH (benign prostatic hypertrophy)   . Chronic atrial fibrillation (HCC)    CHA2DS2VASC score 3    . Esophageal ring   . GERD (gastroesophageal reflux disease)   . History of GI bleed   . Hypercholesteremia   . Osteoarthritis   . Osteoporosis   .  Peripheral neuropathy   . Peripheral neuropathy   . Prostate cancer ([place])    treated with observation  . Trigger finger of both hands   . Vertigo   . Vitamin D deficiency     Assessment/Plan: 1 Day Post-Op Procedure(s) (LRB): LEFT PERIPROSTHETIC FEMUR FRACTURE WITH ZIMMER CABLE AND PLATE (Left) Principal Problem:   Hip fracture (HCC) Active Problems:   Chronic atrial fibrillation (HCC)   HLD (hyperlipidemia)   AKI (acute kidney injury) ([place])  Estimated body mass index is 25.12 kg/m as calculated from the following:   Height as of this encounter: 5\' 10"  (1.778 m).   Weight as of this encounter: 79.4 kg. Up with therapy  DVT Prophylaxis - Eliquis - 1/2 dose today and tomorrow, to resume normal dosing on 12/26. Partial weight bearing to the LLE (25%).  Patient lives at home alone, will most likely require discharge to SNF for 24/7 assistance and supervision with ambulation. Disposition will be per the medical team however.   Theresa Duty, PA-C Orthopedic Surgery 04/28/2019, 8:06 AM

## 2019-04-28 NOTE — Discharge Instructions (Signed)
Dr. Gaynelle Arabian Total Joint Specialist [facility] [address] [place], [place] [address] [phone]  FRACTURE POST-OPERATIVE DISCHARGE INSTRUCTIONS   Guidelines Following Surgery   HOME CARE INSTRUCTIONS  . Remove items at home which could result in a fall. This includes throw rugs or furniture in walking pathways.   ICE to the affected leg every three hours for 30 minutes at a time and then as needed for pain and swelling.  Continue to use ice on the leg for pain and swelling from surgery. You may notice swelling that will progress down to the foot and ankle.  This is normal after surgery.  Elevate the leg when you are not up walking on it.    Continue to use the breathing machine which will help keep your temperature down.  It is common for your temperature to cycle up and down following surgery, especially at night when you are not up moving around and exerting yourself.  The breathing machine keeps your lungs expanded and your temperature down.  DRESSING / WOUND CARE / SHOWERING You may shower 3 days after surgery, but keep the wounds dry during showering.  You may use an occlusive plastic wrap (Press'n Seal for example), NO SOAKING/SUBMERGING IN THE BATHTUB.  If the bandage gets wet, change with a clean dry gauze.  If the incision gets wet, pat the wound dry with a clean towel. You may start showering once you are discharged home but do not submerge the incision under water. Just pat the incision dry and apply a dry gauze dressing on daily. Change the surgical dressing daily and reapply a dry dressing each time.  ACTIVITY Walk with your walker as instructed. Use walker as long as suggested by your caregivers. Avoid periods of inactivity such as sitting longer than an hour when not asleep. This helps prevent blood clots.  Do not drive a car for 6 weeks or until released by you surgeon.  Do not drive while taking narcotics.  WEIGHT BEARING Partial weight  bearing to the left lower extremity (25%). Use your walker as directed by the physical therapist.  MEDICATIONS See your medication summary on the "After Visit Summary" that the nursing staff will review with you prior to discharge.  You may have some home medications which will be placed on hold until you complete the course of blood thinner medication.  It is important for you to complete the blood thinner medication as prescribed by your surgeon.  Continue your approved medications as instructed at time of discharge.                                                 FOLLOW-UP APPOINTMENTS Make sure you keep all of your appointments after your operation with your surgeon and caregivers. You should call the office at the above phone number and make an appointment for approximately two weeks after the date of your surgery or on the date instructed by your surgeon outlined in the "After Visit Summary".  IF YOU ARE TRANSFERRED TO A SKILLED REHAB FACILITY If the patient is transferred to a skilled rehab facility following release from the hospital, a list of the current medications will be sent to the facility for the patient to continue.  When discharged from the skilled rehab facility, please have the facility set up the patient's [facility] prior to being released. Also, the skilled facility will be responsible for providing the patient with their medications at time of release from the facility to include their pain medication, the muscle relaxants, and their blood thinner medication. If the patient is still at the rehab facility at time of the two week follow up appointment, the skilled rehab facility will also need to assist the patient in arranging follow up appointment in our office and any transportation needs.  MAKE SURE YOU:  . Understand these instructions.  . Get help right away if you are not doing well or get worse.   Do not submerge incision under water. Please use good  hand washing techniques while changing dressing each day. May shower starting three days after surgery. Please use a clean towel to pat the incision dry following showers. Continue to use ice for pain and swelling after surgery. Do not use any lotions or creams on the incision until instructed by your surgeon. Marland Kitchen

## 2019-04-28 NOTE — Progress Notes (Signed)
Physical Therapy Treatment Patient Details Name: George [name] MRN: [id number] DOB: May 04, 1924 Today's Date: 04/28/2019    History of Present Illness Pt is 85 yo male with history of afib, hyperlipidemia, OA, osteoporosis, and prostate CA (see full PMH in H and P) who presented to the ED with L hip pain after twisting it while walking (did not fall).  Pt found to have L periprosthetic femur fracture and is now  s/p ORIF on 04/27/19.    PT Comments    Pt fatigued and required maximove transfer for back to bed.  Assisted nurse tech with transfers.  Pt did participate in chair mobility and rolling with cues for technique and assist.    Follow Up Recommendations  SNF     Equipment Recommendations  Other (comment)    Recommendations for Other Services       Precautions / Restrictions Precautions Precautions: Fall Required Braces or Orthoses: Knee Immobilizer - Left Knee Immobilizer - Left: On at all times Restrictions LLE Weight Bearing: Partial weight bearing LLE Partial Weight Bearing Percentage or Pounds: 25%    Mobility  Bed Mobility Overal bed mobility: Needs Assistance Bed Mobility: Rolling Rolling: Mod assist;+2 for physical assistance         General bed mobility comments: increased time: cues to reach for bed rail; assist for L LE  Transfers Overall transfer level: Needs assistance - Maximove back to bed (assist of 2 to guard L LE)                  Ambulation/Gait                 Stairs             Wheelchair Mobility    Modified Rankin (Stroke Patients Only)       Balance Overall balance assessment: Needs assistance Sitting-balance support: Bilateral upper extremity supported;Feet supported Sitting balance-Leahy Scale: Fair Sitting balance - Comments: Worked on leaning forward and weight shifting in chair for balance and for maximove pad placement; cues for technique with mobility                                     Cognition Arousal/Alertness: Awake/alert Behavior During Therapy: WFL for tasks assessed/performed Overall Cognitive Status: No family/caregiver present to determine baseline cognitive functioning Area of Impairment: Orientation;Following commands;Safety/judgement;Problem solving                 Orientation Level: Time     Following Commands: Follows one step commands inconsistently Safety/Judgement: Decreased awareness of safety   Problem Solving: Slow processing;Difficulty sequencing;Requires verbal cues;Requires tactile cues        Exercises      General Comments        Pertinent Vitals/Pain Pain Assessment: Faces Faces Pain Scale: Hurts even more Pain Location: L hip with transfers Pain Descriptors / Indicators: Grimacing;Discomfort Pain Intervention(s): Limited activity within patient's tolerance;Repositioned    Home Living                      Prior Function            PT Goals (current goals can now be found in the care plan section) Progress towards PT goals: Not progressing toward goals - comment(same day as eval - pt fatigued)    Frequency    Min 3X/week      PT Plan Current plan  remains appropriate    Co-evaluation              AM-PAC PT "6 Clicks" Mobility   Outcome Measure  Help needed turning from your back to your side while in a flat bed without using bedrails?: Total Help needed moving from lying on your back to sitting on the side of a flat bed without using bedrails?: Total Help needed moving to and from a bed to a chair (including a wheelchair)?: Total Help needed standing up from a chair using your arms (e.g., wheelchair or bedside chair)?: Total Help needed to walk in hospital room?: Total Help needed climbing 3-5 steps with a railing? : Total 6 Click Score: 6    End of Session Equipment Utilized During Treatment: Gait belt;Left knee immobilizer Activity Tolerance: Patient tolerated treatment  well Patient left: in bed;with call bell/phone within reach;with bed alarm set Nurse Communication: Mobility status;Need for lift equipment;Precautions;Weight bearing status PT Visit Diagnosis: Unsteadiness on feet (R26.81);Muscle weakness (generalized) (M62.81);Other abnormalities of gait and mobility (R26.89)     Time: 1400-1420 PT Time Calculation (min) (ACUTE ONLY): 20 min  Charges:  $Therapeutic Activity: 8-22 mins                     Maggie Font, PT Acute Rehab Services Pager [phone] [facility] Rehab [phone] [facility] [phone]    Karlton Lemon 04/28/2019, 3:03 PM

## 2019-04-29 ENCOUNTER — Inpatient Hospital Stay (HOSPITAL_COMMUNITY): Payer: Medicare Other

## 2019-04-29 LAB — CBC WITH DIFFERENTIAL/PLATELET
Abs Immature Granulocytes: 0.83 10*3/uL — ABNORMAL HIGH (ref 0.00–0.07)
Basophils Absolute: 0.1 10*3/uL (ref 0.0–0.1)
Basophils Relative: 0 %
Eosinophils Absolute: 0 10*3/uL (ref 0.0–0.5)
Eosinophils Relative: 0 %
HCT: 29.6 % — ABNORMAL LOW (ref 39.0–52.0)
Hemoglobin: 9.1 g/dL — ABNORMAL LOW (ref 13.0–17.0)
Immature Granulocytes: 4 %
Lymphocytes Relative: 3 %
Lymphs Abs: 0.7 10*3/uL (ref 0.7–4.0)
MCH: 31 pg (ref 26.0–34.0)
MCHC: 30.7 g/dL (ref 30.0–36.0)
MCV: 100.7 fL — ABNORMAL HIGH (ref 80.0–100.0)
Monocytes Absolute: 2.1 10*3/uL — ABNORMAL HIGH (ref 0.1–1.0)
Monocytes Relative: 10 %
Neutro Abs: 18 10*3/uL — ABNORMAL HIGH (ref 1.7–7.7)
Neutrophils Relative %: 83 %
Platelets: 188 10*3/uL (ref 150–400)
RBC: 2.94 MIL/uL — ABNORMAL LOW (ref 4.22–5.81)
RDW: 20.2 % — ABNORMAL HIGH (ref 11.5–15.5)
WBC: 21.6 10*3/uL — ABNORMAL HIGH (ref 4.0–10.5)
nRBC: 0.3 % — ABNORMAL HIGH (ref 0.0–0.2)

## 2019-04-29 LAB — BASIC METABOLIC PANEL
Anion gap: 9 (ref 5–15)
BUN: 47 mg/dL — ABNORMAL HIGH (ref 8–23)
CO2: 22 mmol/L (ref 22–32)
Calcium: 8.3 mg/dL — ABNORMAL LOW (ref 8.9–10.3)
Chloride: 109 mmol/L (ref 98–111)
Creatinine, Ser: 1.51 mg/dL — ABNORMAL HIGH (ref 0.61–1.24)
GFR calc Af Amer: 45 mL/min — ABNORMAL LOW (ref >60)
GFR calc non Af Amer: 39 mL/min — ABNORMAL LOW (ref >60)
Glucose, Bld: 249 mg/dL — ABNORMAL HIGH (ref 70–99)
Potassium: 4.6 mmol/L (ref 3.5–5.1)
Sodium: 140 mmol/L (ref 135–145)

## 2019-04-29 LAB — CULTURE, BLOOD (ROUTINE X 2)
Culture: NO GROWTH
Culture: NO GROWTH
Special Requests: ADEQUATE
Special Requests: ADEQUATE

## 2019-04-29 MED ORDER — POLYETHYLENE GLYCOL 3350 17 G PO PACK
17.0000 g | PACK | Freq: Two times a day (BID) | ORAL | Status: DC
  Administered 2019-04-29 – 2019-05-03 (×10): 17 g via ORAL
  Filled 2019-04-29 (×10): qty 1

## 2019-04-29 MED ORDER — FUROSEMIDE 10 MG/ML IJ SOLN
40.0000 mg | Freq: Once | INTRAMUSCULAR | Status: AC
  Administered 2019-04-29: 40 mg via INTRAVENOUS
  Filled 2019-04-29: qty 4

## 2019-04-29 NOTE — Progress Notes (Signed)
Orthopedics Progress Note  Subjective: Patient comfortable this morning. No complaints.  Objective:  Vitals:   04/28/19 2345 04/29/19 0428  BP: 128/68 135/78  Pulse: 84 85  Resp:  18  Temp:  98.8 F (37.1 C)  SpO2:  94%    General: Awake and alert  Musculoskeletal: Left hip and leg wounds healing well. Dressing changed. Compartments supple.  Neurovascularly intact  Lab Results  Component Value Date   WBC 21.6 (H) 04/29/2019   HGB 9.1 (L) 04/29/2019   HCT 29.6 (L) 04/29/2019   MCV 100.7 (H) 04/29/2019   PLT 188 04/29/2019       Component Value Date/Time   NA 140 04/29/2019 0534   K 4.6 04/29/2019 0534   CL 109 04/29/2019 0534   CO2 22 04/29/2019 0534   GLUCOSE 249 (H) 04/29/2019 0534   BUN 47 (H) 04/29/2019 0534   CREATININE 1.51 (H) 04/29/2019 0534   CALCIUM 8.3 (L) 04/29/2019 0534   GFRNONAA 39 (L) 04/29/2019 0534   GFRAA 45 (L) 04/29/2019 0534    Lab Results  Component Value Date   INR 1.1 04/22/2019   INR 1.18 12/15/2009   INR 1.23 12/14/2009    Assessment/Plan: s/p Procedure(s): LEFT PERIPROSTHETIC FEMUR FRACTURE WITH ZIMMER CABLE AND PLATE Stable this morning. Dressing changed and wound looks good with no signs for infection. Continue mobilization with PT. DVT prophylaxis. Pulmonary toilet  Doran Heater. Veverly Fells, MD 04/29/2019 8:03 AM

## 2019-04-29 NOTE — TOC Progression Note (Signed)
Transition of Care [facility]) - Progression Note    Patient Details  Name: George [name] MRN: [id number] Date of Birth: 1923-05-15  Transition of Care [facility]) CM/SW Contact  Mistee Soliman, Juliann Pulse, RN Phone Number: 04/29/2019, 12:38 PM  Clinical Narrative: Patient alert x1.Levt vm w/contacts-for d/c plans. PT recc SNF-await call back from either [place] or [place] listed as contacts.     Expected Discharge Plan: [place] Barriers to Discharge: Continued Medical Work up  Expected Discharge Plan and Services Expected Discharge Plan: [place]   Discharge Planning Services: CM Consult   Living arrangements for the past 2 months: Single Family Home Expected Discharge Date: 04/28/19                                     Social Determinants of Health (SDOH) Interventions    Readmission Risk Interventions No flowsheet data found.

## 2019-04-29 NOTE — Progress Notes (Signed)
Progress Note    George [name]  ASN:[id number] DOB: 13-Aug-1923  DOA: 04/22/2019 PCP: Merrilee Seashore, MD         Assessment/Plan:   Principal Problem:   Hip fracture [facility]) Active Problems:   Chronic atrial fibrillation ([place])   HLD (hyperlipidemia)   AKI (acute kidney injury) ([place])    Left periprosthetic hip fracture s/p ORIF on 04/27/2019  Orthopedics on board  Pain management/DVT PPx as per orthopedics  PT/OT as per orthopedics  Leukocytosis Somewhat chronic, closely reactive post op Afebrile UA/UC/chest x-ray pending  Acute blood loss anemia Hemoglobin dropped to 6.6, s/p transfused 1 unit of packed red blood cell on 12/22 Patient also received 2 units of PRBC during surgery  Monitor closely post op Daily CBC  UTI 2/2 Enterococcus faecalis  Currently afebrile, with leukocytosis Urine culture showed Enterococcus faecalis Continue amoxicillin for total of 5 days Daily CBC, will trend  Acute kidney injury Improving s/p IVF, continue to hold Daily BMP  Chronic atrial fibrillation Rate controlled Continue cardizem Chest x-ray showed increased vascular congestion Echo showed EF of 65 to 70%, left ventricular diastolic function could not be evaluated Continue Eliquis  Possible acute on chronic diastolic HF BNP 193, some wheezing noted Chest x-ray with increased vascular congestion Give 1 dose of IV Lasix on 04/29/2019 Strict I's and O's, daily weight  HLD Continue crestor  History of prostate cancer Outpt follow up   Family Communication/Anticipated D/C date and plan/Code Status   DVT prophylaxis: Eliquis Code Status: DNR Family Communication: Plan discussed with the patient Disposition Plan: SNF      Subjective:   Patient seen and examined at bedside.  Met patient eating breakfast, requesting for toothpick repeatedly.  Denies any new complaints.  Objective:    Vitals:   04/28/19 2013 04/28/19 2345 04/29/19 0428 04/29/19  1127  BP: (!) 149/62 128/68 135/78 126/60  Pulse: 87 84 85   Resp: 18  18   Temp: 97.7 F (36.5 C)  98.8 F (37.1 C)   TempSrc: Oral  Oral   SpO2: 93%  94%   Weight:      Height:        Intake/Output Summary (Last 24 hours) at 04/29/2019 1435 Last data filed at 04/29/2019 1035 Gross per 24 hour  Intake 120 ml  Output 1600 ml  Net -1480 ml   Filed Weights   04/22/19 2200 04/27/19 1212  Weight: 79.4 kg 79.4 kg    Exam:  General: NAD   Cardiovascular: S1, S2 present  Respiratory:  Bibasilar crackles noted, mild wheezing noted  Abdomen: Soft, nontender, nondistended, bowel sounds present  Musculoskeletal: No bilateral pedal edema noted, LLE dressing C/D/I  Skin: Normal  Psychiatry: Normal mood     Data Reviewed:   I have personally reviewed following labs and imaging studies:  Labs: Labs show the following:   Basic Metabolic Panel: Recent Labs  Lab 04/24/19 1919 04/25/19 0449 04/25/19 0632 04/27/19 0442 04/27/19 1552 04/27/19 1613 04/28/19 0444 04/29/19 0534  NA 136 QUESTIONABLE RESULTS, RECOMMEND RECOLLECT TO VERIFY 136  --  139 141 141 140  K 5.0 QUESTIONABLE RESULTS, RECOMMEND RECOLLECT TO VERIFY 4.5  --  6.8* 4.5 4.7 4.6  CL 102 QUESTIONABLE RESULTS, RECOMMEND RECOLLECT TO VERIFY 103  --  106 105 108 109  CO2 25 QUESTIONABLE RESULTS, RECOMMEND RECOLLECT TO VERIFY 25  --   --   --  24 22  GLUCOSE 172* QUESTIONABLE RESULTS, RECOMMEND RECOLLECT TO VERIFY 158*  --  145* 142* 140* 249*  BUN 55* QUESTIONABLE RESULTS, RECOMMEND RECOLLECT TO VERIFY 56*  --  69* 43* 44* 47*  CREATININE 3.07* QUESTIONABLE RESULTS, RECOMMEND RECOLLECT TO VERIFY 2.73* 1.87* 1.60* 1.60* 1.56* 1.51*  CALCIUM 8.2* QUESTIONABLE RESULTS, RECOMMEND RECOLLECT TO VERIFY 8.0*  --   --   --  8.1* 8.3*  MG  --  QUESTIONABLE RESULTS, RECOMMEND RECOLLECT TO VERIFY  --   --   --   --   --   --   PHOS 2.9 QUESTIONABLE RESULTS, RECOMMEND RECOLLECT TO VERIFY 2.7  --   --   --   --   --     GFR Estimated Creatinine Clearance: 31.6 mL/min (A) (by C-G formula based on SCr of 1.51 mg/dL (H)). Liver Function Tests: Recent Labs  Lab 04/23/19 0618 04/24/19 1919 04/25/19 0449 04/25/19 0632  AST 139*  --   --   --   ALT 69*  --   --   --   ALKPHOS 78  --   --   --   BILITOT 1.8*  --   --   --   PROT 6.3*  --   --   --   ALBUMIN 3.4* 2.7* QUESTIONABLE RESULTS, RECOMMEND RECOLLECT TO VERIFY 2.5*   No results for input(s): LIPASE, AMYLASE in the last 168 hours. No results for input(s): AMMONIA in the last 168 hours. Coagulation profile Recent Labs  Lab 04/22/19 2038  INR 1.1    CBC: Recent Labs  Lab 04/22/19 2038 04/23/19 0618 04/25/19 3875 04/25/19 0632 04/26/19 0526 04/27/19 1017 04/27/19 1552 04/27/19 1613 04/28/19 0444 04/29/19 0534  WBC 24.0* 16.9* 18.5*  --  15.6* 18.0*  --   --  16.6* 21.6*  NEUTROABS 21.1* 12.9* 15.3*  --   --   --   --   --  12.6* 18.0*  HGB 13.4 12.5* 7.7*  --  6.6* 8.5* 10.2* 10.5* 10.5* 9.1*  HCT 41.8 39.3 24.2*   < > 21.2* 26.7* 30.0* 31.0* 32.9* 29.6*  MCV 106.9* 106.5* 109.5*  --  111.6* 106.4*  --   --  100.3* 100.7*  PLT 162 153 133*  --  130* 159  --   --  147* 188   < > = values in this interval not displayed.   Cardiac Enzymes: No results for input(s): CKTOTAL, CKMB, CKMBINDEX, TROPONINI in the last 168 hours. BNP (last 3 results) No results for input(s): PROBNP in the last 8760 hours. CBG: Recent Labs  Lab 04/23/19 0756  GLUCAP 167*   D-Dimer: No results for input(s): DDIMER in the last 72 hours. Hgb A1c: No results for input(s): HGBA1C in the last 72 hours. Lipid Profile: No results for input(s): CHOL, HDL, LDLCALC, TRIG, CHOLHDL, LDLDIRECT in the last 72 hours. Thyroid function studies: No results for input(s): TSH, T4TOTAL, T3FREE, THYROIDAB in the last 72 hours.  Invalid input(s): FREET3 Anemia work up: Recent Labs    04/26/19 1829  VITAMINB12 316  FERRITIN 95  TIBC 203*  IRON 34*   Sepsis  Labs: Recent Labs  Lab 04/24/19 0606 04/26/19 0526 04/27/19 1017 04/28/19 0444 04/29/19 0534  PROCALCITON 2.31  --   --   --   --   WBC  --  15.6* 18.0* 16.6* 21.6*    Microbiology Recent Results (from the past 240 hour(s))  SARS CORONAVIRUS 2 (TAT 6-24 HRS) Nasopharyngeal Nasopharyngeal Swab     Status: None   Collection Time: 04/22/19  8:38 PM  Specimen: Nasopharyngeal Swab  Result Value Ref Range Status   SARS Coronavirus 2 NEGATIVE NEGATIVE Final    Comment: (NOTE) SARS-CoV-2 target nucleic acids are NOT DETECTED. The SARS-CoV-2 RNA is generally detectable in upper and lower respiratory specimens during the acute phase of infection. Negative results do not preclude SARS-CoV-2 infection, do not rule out co-infections with other pathogens, and should not be used as the sole basis for treatment or other patient management decisions. Negative results must be combined with clinical observations, patient history, and epidemiological information. The expected result is Negative. Fact Sheet for Patients: SugarRoll.be Fact Sheet for Healthcare Providers: https://www.woods-mathews.com/ This test is not yet approved or cleared by the [place] FDA and  has been authorized for detection and/or diagnosis of SARS-CoV-2 by FDA under an Emergency Use Authorization (EUA). This EUA will remain  in effect (meaning this test can be used) for the duration of the COVID-19 declaration under Section 56 4(b)(1) of the Act, 21 U.S.C. section 360bbb-3(b)(1), unless the authorization is terminated or revoked sooner. Performed at [facility], [place] [address], [place], [place] [address]   Surgical PCR screen     Status: None   Collection Time: 04/24/19  9:00 AM   Specimen: Nasal Mucosa; Nasal Swab  Result Value Ref Range Status   MRSA, PCR NEGATIVE NEGATIVE Final   Staphylococcus aureus NEGATIVE NEGATIVE Final    Comment: (NOTE) The Xpert SA  Assay (FDA approved for NASAL specimens in patients 80 years of age and older), is one component of a comprehensive surveillance program. It is not intended to diagnose infection nor to guide or monitor treatment. Performed at [facility], [place] [address] [place], [place] [address]   MRSA PCR Screening     Status: None   Collection Time: 04/24/19 10:29 AM   Specimen: Nasopharyngeal  Result Value Ref Range Status   MRSA by PCR NEGATIVE NEGATIVE Final    Comment:        The GeneXpert MRSA Assay (FDA approved for NASAL specimens only), is one component of a comprehensive MRSA colonization surveillance program. It is not intended to diagnose MRSA infection nor to guide or monitor treatment for MRSA infections. Performed at [facility], [place] [address], [place], [place] [address]   Culture, Urine     Status: Abnormal   Collection Time: 04/24/19 11:21 AM   Specimen: Urine, Clean Catch  Result Value Ref Range Status   Specimen Description   Final    URINE, CLEAN CATCH Performed at [facility], [place] [address], [place], [place] [address]    Special Requests   Final    NONE Performed at [facility], [place] [address], [place], [place] [address]    Culture >=100,000 COLONIES/mL ENTEROCOCCUS FAECALIS (A)  Final   Report Status 04/26/2019 FINAL  Final   Organism ID, Bacteria ENTEROCOCCUS FAECALIS (A)  Final      Susceptibility   Enterococcus faecalis - MIC*    AMPICILLIN <=2 SENSITIVE Sensitive     NITROFURANTOIN <=16 SENSITIVE Sensitive     VANCOMYCIN 1 SENSITIVE Sensitive     * >=100,000 COLONIES/mL ENTEROCOCCUS FAECALIS  Culture, blood (routine x 2)     Status: None   Collection Time: 04/24/19 11:35 AM   Specimen: BLOOD  Result Value Ref Range Status   Specimen Description   Final    BLOOD RIGHT ANTECUBITAL Performed at [facility], [place] [address] [place], [place]  [address]    Special Requests   Final    BOTTLES DRAWN AEROBIC AND ANAEROBIC Blood Culture adequate volume Performed at [facility], [place] [address], [place], [place] [address]    Culture   Final    NO GROWTH 5 DAYS Performed at [facility], [place] [address] [place], [place] [address]    Report Status 04/29/2019 FINAL  Final  Culture, blood (routine x 2)     Status: None   Collection Time: 04/24/19 11:41 AM   Specimen: BLOOD RIGHT ARM  Result Value Ref Range Status   Specimen Description   Final    BLOOD RIGHT ARM Performed at [facility], [place] [address] [place], [place] [address]    Special Requests   Final    BOTTLES DRAWN AEROBIC AND ANAEROBIC Blood Culture adequate volume Performed at [facility], [place] [address] [place], [place] [address]    Culture   Final    NO GROWTH 5 DAYS Performed at [facility], [place] [address] [place], [place] [address]    Report Status 04/29/2019 FINAL  Final  Expectorated sputum assessment w rflx to resp cult     Status: None   Collection Time: 04/25/19  6:38 PM   Specimen: Sputum  Result Value Ref Range Status   Specimen Description SPUTUM  Final   Special Requests NONE  Final   Sputum evaluation   Final    THIS SPECIMEN IS ACCEPTABLE FOR SPUTUM CULTURE Performed at [facility], [place] [address] [place], [place] [address]    Report Status 04/25/2019 FINAL  Final  Culture, respiratory     Status: None   Collection Time: 04/25/19  6:38 PM   Specimen: SPU  Result Value Ref Range Status   Specimen Description   Final    SPUTUM Performed at [place] [address], [place], [place] [address]    Special Requests   Final    NONE Reflexed from [id number] Performed at [place] [address], [place], [place] [address]    Gram Stain   Final    FEW WBC PRESENT, PREDOMINANTLY PMN MODERATE GRAM  NEGATIVE RODS MODERATE GRAM POSITIVE COCCI IN CLUSTERS    Culture   Final    Consistent with normal respiratory flora. Performed at [facility], [place] [address] [place], [place] [address]    Report Status 04/27/2019 FINAL  Final    Procedures and diagnostic studies:  DG Chest Port 1 View  Result Date: 04/27/2019 CLINICAL DATA:  Shortness of breath post femur fracture fixation/ORIF. EXAM: PORTABLE CHEST 1 VIEW COMPARISON:  Radiographs 04/24/2019 and 04/22/2019. FINDINGS: 1718 hours. The heart size and mediastinal contours are stable. There is increased vascular congestion without overt pulmonary edema. Asymmetric left basilar atelectasis appears slightly worse. There is no pneumothorax or significant pleural effusion. The bones appear unchanged. IMPRESSION: Increased vascular congestion without overt pulmonary edema. Mildly increased left basilar atelectasis. Electronically Signed   By: Richardean Sale M.D.   On: 04/27/2019 17:44   ECHOCARDIOGRAM COMPLETE  Result Date: 04/28/2019   ECHOCARDIOGRAM REPORT   Patient Name:   George [name] Date of Exam: 04/28/2019 Medical Rec #:  [id number]        Height:       70.0 in Accession #:    [id number]       Weight:       175.0 lb Date  of Birth:  06-Jan-1924        BSA:          1.97 m Patient Age:    89 years         BP:           137/67 mmHg Patient Gender: M                HR:           84 bpm. Exam Location:  Inpatient Procedure: 2D Echo Indications:    dyspnea 786.09  History:        Patient has prior history of Echocardiogram examinations, most                 recent 12/15/2014. Arrythmias:Atrial Fibrillation; Risk                 Factors:Dyslipidemia.  Sonographer:    Jannett Celestine RDCS (AE) Referring Phys: [id number] [address]  Sonographer Comments: Technically difficult study due to poor echo windows, suboptimal parasternal window, suboptimal apical window and suboptimal subcostal window. limited mobility due to leg injury IMPRESSIONS   1. Left ventricular ejection fraction, by visual estimation, is 65 to 70%. The left ventricle has hyperdynamic function. There is mildly increased left ventricular hypertrophy.  2. Left ventricular diastolic function could not be evaluated.  3. The left ventricle has no regional wall motion abnormalities.  4. Global right ventricle has normal systolic function.The right ventricular size is normal. No increase in right ventricular wall thickness.  5. Left atrial size was mildly dilated.  6. Right atrial size was normal.  7. The mitral valve is normal in structure. No evidence of mitral valve regurgitation. No evidence of mitral stenosis.  8. The tricuspid valve is grossly normal.  9. The aortic valve is normal in structure. Aortic valve regurgitation is not visualized. No evidence of aortic valve sclerosis or stenosis. 10. The pulmonic valve was not well visualized. Pulmonic valve regurgitation is not visualized. 11. TR signal is inadequate for assessing pulmonary artery systolic pressure. 12. The inferior vena cava is normal in size with greater than 50% respiratory variability, suggesting right atrial pressure of 3 mmHg. FINDINGS  Left Ventricle: Left ventricular ejection fraction, by visual estimation, is 65 to 70%. The left ventricle has hyperdynamic function. The left ventricle has no regional wall motion abnormalities. There is mildly increased left ventricular hypertrophy. Concentric left ventricular hypertrophy. The left ventricular diastology could not be evaluated due to atrial fibrillation. Left ventricular diastolic function could not be evaluated. Normal left atrial pressure. Right Ventricle: The right ventricular size is normal. No increase in right ventricular wall thickness. Global RV systolic function is has normal systolic function. Left Atrium: Left atrial size was mildly dilated. Right Atrium: Right atrial size was normal in size Pericardium: There is no evidence of pericardial effusion. Mitral  Valve: The mitral valve is normal in structure. No evidence of mitral valve regurgitation. No evidence of mitral valve stenosis by observation. Tricuspid Valve: The tricuspid valve is grossly normal. Tricuspid valve regurgitation is not demonstrated. Aortic Valve: The aortic valve is normal in structure. Aortic valve regurgitation is not visualized. The aortic valve is structurally normal, with no evidence of sclerosis or stenosis. Pulmonic Valve: The pulmonic valve was not well visualized. Pulmonic valve regurgitation is not visualized. Pulmonic regurgitation is not visualized. Aorta: The aortic root, ascending aorta and aortic arch are all structurally normal, with no evidence of dilitation or obstruction. Venous: The inferior vena cava  is normal in size with greater than 50% respiratory variability, suggesting right atrial pressure of 3 mmHg. IAS/Shunts: No atrial level shunt detected by color flow Doppler. There is no evidence of a patent foramen ovale. No ventricular septal defect is seen or detected. There is no evidence of an atrial septal defect.  LEFT VENTRICLE PLAX 2D LVIDd:         2.20 cm LVIDs:         1.90 cm LV PW:         1.30 cm LV IVS:        1.30 cm LVOT diam:     2.20 cm LV SV:         5 ml LV SV Index:   2.53 LVOT Area:     3.80 cm  RIGHT VENTRICLE TAPSE (M-mode): 1.5 cm LEFT ATRIUM             Index       RIGHT ATRIUM           Index LA diam:        4.20 cm 2.13 cm/m  RA Area:     15.20 cm LA Vol (A2C):   45.3 ml 22.97 ml/m RA Volume:   40.30 ml  20.43 ml/m LA Vol (A4C):   64.9 ml 32.90 ml/m LA Biplane Vol: 59.1 ml 29.96 ml/m  AORTIC VALVE LVOT Vmax:   92.80 cm/s LVOT Vmean:  61.400 cm/s LVOT VTI:    0.165 m  AORTA Ao Root diam: 3.60 cm MITRAL VALVE MV Area (PHT): 4.36 cm             SHUNTS MV PHT:        50.46 msec           Systemic VTI:  0.16 m MV Decel Time: 174 msec             Systemic Diam: 2.20 cm MV E velocity: 124.00 cm/s 103 cm/s  Mihai Croitoru MD Electronically signed by  Sanda Klein MD Signature Date/Time: 04/28/2019/3:34:26 PM    Final     Medications:   . amoxicillin  500 mg Oral Q8H  . apixaban  2.5 mg Oral Q12H  . diltiazem  30 mg Oral Q6H  . docusate sodium  100 mg Oral BID  . feeding supplement  1 Container Oral Q24H  . feeding supplement (ENSURE ENLIVE)  237 mL Oral BID BM  . furosemide  40 mg Intravenous Once  . multivitamin with minerals  1 tablet Oral Daily  . polyethylene glycol  17 g Oral BID  . rosuvastatin  10 mg Oral Daily   Continuous Infusions: . methocarbamol (ROBAXIN) IV 500 mg (04/27/19 1644)     LOS: 7 days   Alma Friendly, MD  [facility]    04/29/2019, 2:35 PM

## 2019-04-30 ENCOUNTER — Inpatient Hospital Stay (HOSPITAL_COMMUNITY): Payer: Medicare Other

## 2019-04-30 LAB — CBC WITH DIFFERENTIAL/PLATELET
Abs Immature Granulocytes: 1.24 10*3/uL — ABNORMAL HIGH (ref 0.00–0.07)
Basophils Absolute: 0.1 10*3/uL (ref 0.0–0.1)
Basophils Relative: 1 %
Eosinophils Absolute: 0 10*3/uL (ref 0.0–0.5)
Eosinophils Relative: 0 %
HCT: 30.2 % — ABNORMAL LOW (ref 39.0–52.0)
Hemoglobin: 9.6 g/dL — ABNORMAL LOW (ref 13.0–17.0)
Immature Granulocytes: 5 %
Lymphocytes Relative: 4 %
Lymphs Abs: 1 10*3/uL (ref 0.7–4.0)
MCH: 31.7 pg (ref 26.0–34.0)
MCHC: 31.8 g/dL (ref 30.0–36.0)
MCV: 99.7 fL (ref 80.0–100.0)
Monocytes Absolute: 2.2 10*3/uL — ABNORMAL HIGH (ref 0.1–1.0)
Monocytes Relative: 9 %
Neutro Abs: 19.8 10*3/uL — ABNORMAL HIGH (ref 1.7–7.7)
Neutrophils Relative %: 81 %
Platelets: 227 10*3/uL (ref 150–400)
RBC: 3.03 MIL/uL — ABNORMAL LOW (ref 4.22–5.81)
RDW: 20.1 % — ABNORMAL HIGH (ref 11.5–15.5)
WBC: 24.4 10*3/uL — ABNORMAL HIGH (ref 4.0–10.5)
nRBC: 0.3 % — ABNORMAL HIGH (ref 0.0–0.2)

## 2019-04-30 LAB — BASIC METABOLIC PANEL
Anion gap: 11 (ref 5–15)
BUN: 53 mg/dL — ABNORMAL HIGH (ref 8–23)
CO2: 27 mmol/L (ref 22–32)
Calcium: 8.4 mg/dL — ABNORMAL LOW (ref 8.9–10.3)
Chloride: 104 mmol/L (ref 98–111)
Creatinine, Ser: 1.61 mg/dL — ABNORMAL HIGH (ref 0.61–1.24)
GFR calc Af Amer: 42 mL/min — ABNORMAL LOW (ref >60)
GFR calc non Af Amer: 36 mL/min — ABNORMAL LOW (ref >60)
Glucose, Bld: 206 mg/dL — ABNORMAL HIGH (ref 70–99)
Potassium: 4.4 mmol/L (ref 3.5–5.1)
Sodium: 142 mmol/L (ref 135–145)

## 2019-04-30 LAB — URINALYSIS, ROUTINE W REFLEX MICROSCOPIC
Bilirubin Urine: NEGATIVE
Glucose, UA: 50 mg/dL — AB
Ketones, ur: NEGATIVE mg/dL
Leukocytes,Ua: NEGATIVE
Nitrite: NEGATIVE
Protein, ur: 30 mg/dL — AB
Specific Gravity, Urine: 1.017 (ref 1.005–1.030)
pH: 5 (ref 5.0–8.0)

## 2019-04-30 LAB — GLUCOSE, CAPILLARY
Glucose-Capillary: 202 mg/dL — ABNORMAL HIGH (ref 70–99)
Glucose-Capillary: 447 mg/dL — ABNORMAL HIGH (ref 70–99)

## 2019-04-30 LAB — PROCALCITONIN: Procalcitonin: 0.2 ng/mL

## 2019-04-30 MED ORDER — INSULIN ASPART 100 UNIT/ML ~~LOC~~ SOLN
0.0000 [IU] | Freq: Three times a day (TID) | SUBCUTANEOUS | Status: DC
  Administered 2019-04-30: 2 [IU] via SUBCUTANEOUS
  Administered 2019-04-30: 6 [IU] via SUBCUTANEOUS
  Administered 2019-05-01 – 2019-05-02 (×3): 1 [IU] via SUBCUTANEOUS
  Administered 2019-05-02 – 2019-05-03 (×2): 2 [IU] via SUBCUTANEOUS
  Administered 2019-05-03: 1 [IU] via SUBCUTANEOUS
  Administered 2019-05-04 (×2): 3 [IU] via SUBCUTANEOUS

## 2019-04-30 MED ORDER — INSULIN ASPART 100 UNIT/ML ~~LOC~~ SOLN
10.0000 [IU] | Freq: Once | SUBCUTANEOUS | Status: AC
  Administered 2019-04-30: 10 [IU] via SUBCUTANEOUS

## 2019-04-30 NOTE — Plan of Care (Signed)
Improvement in patient's orientation noted, some transient confusion. Patient's pain is well controlled, verbalized understanding of education regarding knee imobilizer. Patient had BM 04/29/2019.

## 2019-04-30 NOTE — Progress Notes (Signed)
Subjective: 3 Days Post-Op Procedure(s) (LRB): LEFT PERIPROSTHETIC FEMUR FRACTURE WITH ZIMMER CABLE AND PLATE (Left) Patient reports pain as moderate.    Objective: Vital signs in last 24 hours: Temp:  [98 F (36.7 C)-98.3 F (36.8 C)] 98 F (36.7 C) (12/26 0541) Pulse Rate:  [79-92] 79 (12/26 0541) Resp:  [16-18] 18 (12/26 0541) BP: (126-152)/(60-72) 137/68 (12/26 0541) SpO2:  [96 %-97 %] 96 % (12/26 0541)  Intake/Output from previous day: 12/25 0701 - 12/26 0700 In: 900 [P.O.:900] Out: 3901 [Urine:3900; Stool:1] Intake/Output this shift: No intake/output data recorded.  Recent Labs    04/27/19 1552 04/27/19 1613 04/28/19 0444 04/29/19 0534 04/30/19 0603  HGB 10.2* 10.5* 10.5* 9.1* 9.6*   Recent Labs    04/29/19 0534 04/30/19 0603  WBC 21.6* 24.4*  RBC 2.94* 3.03*  HCT 29.6* 30.2*  PLT 188 227   Recent Labs    04/29/19 0534 04/30/19 0603  NA 140 142  K 4.6 4.4  CL 109 104  CO2 22 27  BUN 47* 53*  CREATININE 1.51* 1.61*  GLUCOSE 249* 206*  CALCIUM 8.3* 8.4*   No results for input(s): LABPT, INR in the last 72 hours.  Neurologically intact ABD soft Neurovascular intact Sensation intact distally Intact pulses distally Dorsiflexion/Plantar flexion intact Incision: dressing C/D/I and no drainage No cellulitis present Compartment soft no calf pain or sign of DVT   Assessment/Plan: 3 Days Post-Op Procedure(s) (LRB): LEFT PERIPROSTHETIC FEMUR FRACTURE WITH ZIMMER CABLE AND PLATE (Left) Advance diet Up with therapy  Continue 25% PWB LLE Eliquis, to resume full dose today D/C dispo per admitting service   Cecilie Kicks 04/30/2019, 9:04 AM

## 2019-04-30 NOTE — Progress Notes (Addendum)
Progress Note    George [name]  [id number] DOB: 18-Sep-1923  DOA: 04/22/2019 PCP: Merrilee Seashore, MD         Assessment/Plan:   Principal Problem:   Hip fracture [facility]) Active Problems:   Chronic atrial fibrillation ([place])   HLD (hyperlipidemia)   AKI (acute kidney injury) ([place])    Left periprosthetic hip fracture s/p ORIF on 04/27/2019  Orthopedics on board  Pain management/DVT PPx as per orthopedics  PT/OT as per orthopedics  Leukocytosis Somewhat chronic, unknown etiology Afebrile UA unremarkable, UC pending Chest x-ray showed improved mild atelectasis at the left lung base, no acute cardiopulmonary disease otherwise  Acute blood loss anemia Hemoglobin dropped to 6.6, s/p transfused 1 unit of packed red blood cell on 12/22 Patient also received 2 units of PRBC during surgery  Monitor closely post op Daily CBC  UTI 2/2 Enterococcus faecalis  Currently afebrile, with leukocytosis Urine culture showed Enterococcus faecalis Continue amoxicillin for total of 5 days Daily CBC, will trend  Acute kidney injury Improving s/p IVF, continue to hold Daily BMP  Chronic atrial fibrillation Rate controlled Continue cardizem Chest x-ray showed increased vascular congestion, repeat showed improvement Echo showed EF of 65 to 70%, left ventricular diastolic function could not be evaluated Continue Eliquis  Diabetes mellitus type 2 Last A1c 6.5 SSI, Accu-Cheks, hypoglycemic protocol  Possible acute on chronic diastolic HF BNP [id number], some wheezing noted Chest x-ray with increased vascular congestion Give 1 dose of IV Lasix on 04/29/2019 Strict I's and O's, daily weight  HLD Continue crestor  History of prostate cancer Outpt follow up   Family Communication/Anticipated D/C date and plan/Code Status   DVT prophylaxis: Eliquis Code Status: DNR Family Communication: Plan discussed with the patient Disposition Plan: SNF      Subjective:    Patient seen and examined at bedside.  Denies any new complaints.  Seemed a little agitated.  Denies any chest pain, abdominal pain, nausea/vomiting, fever/chills.  Objective:    Vitals:   04/29/19 1525 04/29/19 2045 04/30/19 0541 04/30/19 1346  BP: 129/70 (!) 152/72 137/68 (!) 148/71  Pulse: 91 92 79 88  Resp: 16 18 18 18   Temp: 98 F (36.7 C) 98.3 F (36.8 C) 98 F (36.7 C) (!) 97.5 F (36.4 C)  TempSrc: Oral Oral Oral Oral  SpO2: 97% 97% 96% 96%  Weight:      Height:        Intake/Output Summary (Last 24 hours) at 04/30/2019 1706 Last data filed at 04/30/2019 1351 Gross per 24 hour  Intake 780 ml  Output 4551 ml  Net -3771 ml   Filed Weights   04/22/19 2200 04/27/19 1212  Weight: 79.4 kg 79.4 kg    Exam:  General: NAD   Cardiovascular: S1, S2 present  Respiratory:  Diminished air entry bilaterally  Abdomen: Soft, nontender, nondistended, bowel sounds present  Musculoskeletal: No bilateral pedal edema noted, LLE dressing C/D/I  Skin: Normal  Psychiatry: Normal mood     Data Reviewed:   I have personally reviewed following labs and imaging studies:  Labs: Labs show the following:   Basic Metabolic Panel: Recent Labs  Lab 04/24/19 1919 04/25/19 0449 04/25/19 0632 04/27/19 1552 04/27/19 1613 04/28/19 0444 04/29/19 0534 04/30/19 0603  NA 136 QUESTIONABLE RESULTS, RECOMMEND RECOLLECT TO VERIFY 136 139 141 141 140 142  K 5.0 QUESTIONABLE RESULTS, RECOMMEND RECOLLECT TO VERIFY 4.5 6.8* 4.5 4.7 4.6 4.4  CL 102 QUESTIONABLE RESULTS, RECOMMEND RECOLLECT TO VERIFY 103 106  105 108 109 104  CO2 25 QUESTIONABLE RESULTS, RECOMMEND RECOLLECT TO VERIFY 25  --   --  24 22 27   GLUCOSE 172* QUESTIONABLE RESULTS, RECOMMEND RECOLLECT TO VERIFY 158* 145* 142* 140* 249* 206*  BUN 55* QUESTIONABLE RESULTS, RECOMMEND RECOLLECT TO VERIFY 56* 69* 43* 44* 47* 53*  CREATININE 3.07* QUESTIONABLE RESULTS, RECOMMEND RECOLLECT TO VERIFY 2.73* 1.60* 1.60* 1.56* 1.51*  1.61*  CALCIUM 8.2* QUESTIONABLE RESULTS, RECOMMEND RECOLLECT TO VERIFY 8.0*  --   --  8.1* 8.3* 8.4*  MG  --  QUESTIONABLE RESULTS, RECOMMEND RECOLLECT TO VERIFY  --   --   --   --   --   --   PHOS 2.9 QUESTIONABLE RESULTS, RECOMMEND RECOLLECT TO VERIFY 2.7  --   --   --   --   --    GFR Estimated Creatinine Clearance: 29.6 mL/min (A) (by C-G formula based on SCr of 1.61 mg/dL (H)). Liver Function Tests: Recent Labs  Lab 04/24/19 1919 04/25/19 0449 04/25/19 [id number]  ALBUMIN 2.7* QUESTIONABLE RESULTS, RECOMMEND RECOLLECT TO VERIFY 2.5*   No results for input(s): LIPASE, AMYLASE in the last 168 hours. No results for input(s): AMMONIA in the last 168 hours. Coagulation profile No results for input(s): INR, PROTIME in the last 168 hours.  CBC: Recent Labs  Lab 04/25/19 0632 04/25/19 [id number] 04/26/19 0526 04/27/19 1017 04/27/19 1552 04/27/19 1613 04/28/19 0444 04/29/19 0534 04/30/19 0603  WBC 18.5*  --  15.6* 18.0*  --   --  16.6* 21.6* 24.4*  NEUTROABS 15.3*  --   --   --   --   --  12.6* 18.0* 19.8*  HGB 7.7*  --  6.6* 8.5* 10.2* 10.5* 10.5* 9.1* 9.6*  HCT 24.2*   < > 21.2* 26.7* 30.0* 31.0* 32.9* 29.6* 30.2*  MCV 109.5*  --  111.6* 106.4*  --   --  100.3* 100.7* 99.7  PLT 133*  --  130* 159  --   --  147* 188 227   < > = values in this interval not displayed.   Cardiac Enzymes: No results for input(s): CKTOTAL, CKMB, CKMBINDEX, TROPONINI in the last 168 hours. BNP (last 3 results) No results for input(s): PROBNP in the last 8760 hours. CBG: Recent Labs  Lab 04/30/19 1205  GLUCAP 202*   D-Dimer: No results for input(s): DDIMER in the last 72 hours. Hgb A1c: No results for input(s): HGBA1C in the last 72 hours. Lipid Profile: No results for input(s): CHOL, HDL, LDLCALC, TRIG, CHOLHDL, LDLDIRECT in the last 72 hours. Thyroid function studies: No results for input(s): TSH, T4TOTAL, T3FREE, THYROIDAB in the last 72 hours.  Invalid input(s): FREET3 Anemia work up: No  results for input(s): VITAMINB12, FOLATE, FERRITIN, TIBC, IRON, RETICCTPCT in the last 72 hours. Sepsis Labs: Recent Labs  Lab 04/24/19 0606 04/27/19 1017 04/28/19 0444 04/29/19 0534 04/30/19 0603 04/30/19 0812  PROCALCITON 2.31  --   --   --   --  0.20  WBC  --  18.0* 16.6* 21.6* 24.4*  --     Microbiology Recent Results (from the past 240 hour(s))  SARS CORONAVIRUS 2 (TAT 6-24 HRS) Nasopharyngeal Nasopharyngeal Swab     Status: None   Collection Time: 04/22/19  8:38 PM   Specimen: Nasopharyngeal Swab  Result Value Ref Range Status   SARS Coronavirus 2 NEGATIVE NEGATIVE Final    Comment: (NOTE) SARS-CoV-2 target nucleic acids are NOT DETECTED. The SARS-CoV-2 RNA is generally detectable in upper and lower respiratory specimens  during the acute phase of infection. Negative results do not preclude SARS-CoV-2 infection, do not rule out co-infections with other pathogens, and should not be used as the sole basis for treatment or other patient management decisions. Negative results must be combined with clinical observations, patient history, and epidemiological information. The expected result is Negative. Fact Sheet for Patients: SugarRoll.be Fact Sheet for Healthcare Providers: https://www.woods-mathews.com/ This test is not yet approved or cleared by the [place] FDA and  has been authorized for detection and/or diagnosis of SARS-CoV-2 by FDA under an Emergency Use Authorization (EUA). This EUA will remain  in effect (meaning this test can be used) for the duration of the COVID-19 declaration under Section 56 4(b)(1) of the Act, 21 U.S.C. section 360bbb-3(b)(1), unless the authorization is terminated or revoked sooner. Performed at [facility], [place] [address], [place], [place] [address]   Surgical PCR screen     Status: None   Collection Time: 04/24/19  9:00 AM   Specimen: Nasal Mucosa; Nasal Swab  Result Value Ref  Range Status   MRSA, PCR NEGATIVE NEGATIVE Final   Staphylococcus aureus NEGATIVE NEGATIVE Final    Comment: (NOTE) The Xpert SA Assay (FDA approved for NASAL specimens in patients 49 years of age and older), is one component of a comprehensive surveillance program. It is not intended to diagnose infection nor to guide or monitor treatment. Performed at [facility], [place] [address] [place], [place] [address]   MRSA PCR Screening     Status: None   Collection Time: 04/24/19 10:29 AM   Specimen: Nasopharyngeal  Result Value Ref Range Status   MRSA by PCR NEGATIVE NEGATIVE Final    Comment:        The GeneXpert MRSA Assay (FDA approved for NASAL specimens only), is one component of a comprehensive MRSA colonization surveillance program. It is not intended to diagnose MRSA infection nor to guide or monitor treatment for MRSA infections. Performed at [facility], [place] [address] [place], [place] [address]   Culture, Urine     Status: Abnormal   Collection Time: 04/24/19 11:21 AM   Specimen: Urine, Clean Catch  Result Value Ref Range Status   Specimen Description   Final    URINE, CLEAN CATCH Performed at [facility], [place] [address] [place], [place] [address]    Special Requests   Final    NONE Performed at [facility], [place] [address], [place], [place] [address]    Culture >=100,000 COLONIES/mL ENTEROCOCCUS FAECALIS (A)  Final   Report Status 04/26/2019 FINAL  Final   Organism ID, Bacteria ENTEROCOCCUS FAECALIS (A)  Final      Susceptibility   Enterococcus faecalis - MIC*    AMPICILLIN <=2 SENSITIVE Sensitive     NITROFURANTOIN <=16 SENSITIVE Sensitive     VANCOMYCIN 1 SENSITIVE Sensitive     * >=100,000 COLONIES/mL ENTEROCOCCUS FAECALIS  Culture, blood (routine x 2)     Status: None   Collection Time: 04/24/19 11:35 AM   Specimen: BLOOD  Result Value Ref Range Status   Specimen Description    Final    BLOOD RIGHT ANTECUBITAL Performed at [facility], [place] [address] [place], [place] [address]    Special Requests   Final    BOTTLES DRAWN AEROBIC AND ANAEROBIC Blood Culture adequate volume Performed at [facility] [place] [facility], [place] [address], [place],  [address]    Culture  Final    NO GROWTH 5 DAYS Performed at [facility], [place] [address], [place], [place] [address]    Report Status 04/29/2019 FINAL  Final  Culture, blood (routine x 2)     Status: None   Collection Time: 04/24/19 11:41 AM   Specimen: BLOOD RIGHT ARM  Result Value Ref Range Status   Specimen Description   Final    BLOOD RIGHT ARM Performed at [facility], [place] [address] [place], [place] [address]    Special Requests   Final    BOTTLES DRAWN AEROBIC AND ANAEROBIC Blood Culture adequate volume Performed at [facility], [place] [address] [place], [place] [address]    Culture   Final    NO GROWTH 5 DAYS Performed at [facility], [place] [address] [place], [place] [address]    Report Status 04/29/2019 FINAL  Final  Expectorated sputum assessment w rflx to resp cult     Status: None   Collection Time: 04/25/19  6:38 PM   Specimen: Sputum  Result Value Ref Range Status   Specimen Description SPUTUM  Final   Special Requests NONE  Final   Sputum evaluation   Final    THIS SPECIMEN IS ACCEPTABLE FOR SPUTUM CULTURE Performed at [facility], [place] [address], [place], [place] [address]    Report Status 04/25/2019 FINAL  Final  Culture, respiratory     Status: None   Collection Time: 04/25/19  6:38 PM   Specimen: SPU  Result Value Ref Range Status   Specimen Description   Final    SPUTUM Performed at [place] [address] [place], [place] [address]    Special Requests   Final    NONE Reflexed from [id number] Performed at [place] [address] [place], [place] [address]    Gram Stain   Final    FEW WBC PRESENT, PREDOMINANTLY PMN MODERATE GRAM NEGATIVE RODS MODERATE GRAM POSITIVE COCCI IN CLUSTERS    Culture   Final    Consistent with normal respiratory flora. Performed at [facility], [place] [address], [place], [place] [address]    Report Status 04/27/2019 FINAL  Final    Procedures and diagnostic studies:  DG CHEST PORT 1 VIEW  Result Date: 04/29/2019 CLINICAL DATA:  85 year old who is postop day 2 ORIF LEFT femur fracture, now with leukocytosis. EXAM: PORTABLE CHEST 1 VIEW COMPARISON:  04/27/2019 and earlier. FINDINGS: Suboptimal inspiration. Cardiac silhouette upper normal in size to slightly enlarged for AP portable technique, unchanged. Mild atelectasis at the LEFT lung base, improved since the examination 2 days ago. Resolution of the RIGHT basilar atelectasis since that examination. No new pulmonary parenchymal abnormalities. Pulmonary vascularity normal without evidence of pulmonary edema. IMPRESSION: 1. Mild atelectasis at the LEFT lung base, improved since the examination 2 days ago. 2. No acute cardiopulmonary disease otherwise. Electronically Signed   By: Evangeline Dakin M.D.   On: 04/29/2019 14:57   DG Abd Portable 1V  Result Date: 04/30/2019 CLINICAL DATA:  Constipation and abdominal pain. EXAM: PORTABLE ABDOMEN - 1 VIEW COMPARISON:  CT of the abdomen and pelvis on 04/26/2019 FINDINGS: No evidence of overt bowel obstruction. Mild gas in nondilated small bowel may be consistent with mild ileus. There is fecal material in the colon which does not appear dilated. No gross signs of free air. No abnormal calcifications. Clips are seen related to prior cholecystectomy. Degenerative  disease of the lumbar spine. IMPRESSION: Mild gas in nondilated small bowel may be consistent with mild ileus. Electronically Signed   By: Aletta Edouard M.D.   On: 04/30/2019 08:44    Medications:   .  amoxicillin  500 mg Oral Q8H  . apixaban  2.5 mg Oral Q12H  . diltiazem  30 mg Oral Q6H  . docusate sodium  100 mg Oral BID  . feeding supplement  1 Container Oral Q24H  . feeding supplement (ENSURE ENLIVE)  237 mL Oral BID BM  . insulin aspart  0-6 Units Subcutaneous TID WC  . multivitamin with minerals  1 tablet Oral Daily  . polyethylene glycol  17 g Oral BID  . rosuvastatin  10 mg Oral Daily   Continuous Infusions: . methocarbamol (ROBAXIN) IV 500 mg (04/27/19 1644)     LOS: 8 days   Alma Friendly, MD  [facility]    04/30/2019, 5:06 PM

## 2019-05-01 LAB — CBC WITH DIFFERENTIAL/PLATELET
Abs Immature Granulocytes: 1.52 10*3/uL — ABNORMAL HIGH (ref 0.00–0.07)
Basophils Absolute: 0.2 10*3/uL — ABNORMAL HIGH (ref 0.0–0.1)
Basophils Relative: 1 %
Eosinophils Absolute: 0 10*3/uL (ref 0.0–0.5)
Eosinophils Relative: 0 %
HCT: 33.9 % — ABNORMAL LOW (ref 39.0–52.0)
Hemoglobin: 10.7 g/dL — ABNORMAL LOW (ref 13.0–17.0)
Immature Granulocytes: 7 %
Lymphocytes Relative: 7 %
Lymphs Abs: 1.6 10*3/uL (ref 0.7–4.0)
MCH: 31.8 pg (ref 26.0–34.0)
MCHC: 31.6 g/dL (ref 30.0–36.0)
MCV: 100.9 fL — ABNORMAL HIGH (ref 80.0–100.0)
Monocytes Absolute: 1.6 10*3/uL — ABNORMAL HIGH (ref 0.1–1.0)
Monocytes Relative: 7 %
Neutro Abs: 16.7 10*3/uL — ABNORMAL HIGH (ref 1.7–7.7)
Neutrophils Relative %: 78 %
Platelets: 280 10*3/uL (ref 150–400)
RBC: 3.36 MIL/uL — ABNORMAL LOW (ref 4.22–5.81)
RDW: 19.9 % — ABNORMAL HIGH (ref 11.5–15.5)
WBC: 21.6 10*3/uL — ABNORMAL HIGH (ref 4.0–10.5)
nRBC: 0.6 % — ABNORMAL HIGH (ref 0.0–0.2)

## 2019-05-01 LAB — BASIC METABOLIC PANEL
Anion gap: 15 (ref 5–15)
BUN: 47 mg/dL — ABNORMAL HIGH (ref 8–23)
CO2: 27 mmol/L (ref 22–32)
Calcium: 8 mg/dL — ABNORMAL LOW (ref 8.9–10.3)
Chloride: 99 mmol/L (ref 98–111)
Creatinine, Ser: 1.35 mg/dL — ABNORMAL HIGH (ref 0.61–1.24)
GFR calc Af Amer: 52 mL/min — ABNORMAL LOW (ref >60)
GFR calc non Af Amer: 45 mL/min — ABNORMAL LOW (ref >60)
Glucose, Bld: 177 mg/dL — ABNORMAL HIGH (ref 70–99)
Potassium: 4.3 mmol/L (ref 3.5–5.1)
Sodium: 141 mmol/L (ref 135–145)

## 2019-05-01 LAB — PROCALCITONIN: Procalcitonin: <0.1 ng/mL

## 2019-05-01 LAB — GLUCOSE, CAPILLARY
Glucose-Capillary: 152 mg/dL — ABNORMAL HIGH (ref 70–99)
Glucose-Capillary: 155 mg/dL — ABNORMAL HIGH (ref 70–99)
Glucose-Capillary: 157 mg/dL — ABNORMAL HIGH (ref 70–99)
Glucose-Capillary: 174 mg/dL — ABNORMAL HIGH (ref 70–99)
Glucose-Capillary: 175 mg/dL — ABNORMAL HIGH (ref 70–99)

## 2019-05-01 LAB — URINE CULTURE: Culture: NO GROWTH

## 2019-05-01 MED ORDER — APIXABAN 5 MG PO TABS
5.0000 mg | ORAL_TABLET | Freq: Two times a day (BID) | ORAL | Status: DC
  Administered 2019-05-01 – 2019-05-04 (×6): 5 mg via ORAL
  Filled 2019-05-01 (×6): qty 1

## 2019-05-01 NOTE — Progress Notes (Signed)
Patient extremely irritable, throwing things off his bedside tray and hitting call light on table and bed rails, states he is upset because he asked NT to get up in chair and instead of doing it, he was told it would be a few minutes while equipment and help was obtained and it had already been 5 minutes, instructed on safety measures per hospital policy and safe patient handling, no understanding or acceptance noted

## 2019-05-01 NOTE — Progress Notes (Signed)
Pt was lying in bed when I arrived. He was alert and talked. He said I was the third Chaplain who had seen him today. I confirmed w/staff that his only visitor, prior to me, was his grandson. Doristine Bosworth said he didn't know what was going and I asked him if he would like to talk w/his nurse and he declined. Pt wanted prayer and during prayer he closed his eyes and was quiet with his eyes closed following prayer. Our visit was brief but appreciated by pt. Please page if additional support is needed. [place], MDiv   05/01/19 1700  Clinical Encounter Type  Visited With Patient

## 2019-05-01 NOTE — Plan of Care (Signed)
  Problem: Education: Goal: Verbalization of understanding the information provided (i.e., activity precautions, restrictions, etc) will improve Outcome: Progressing Goal: Individualized Educational Video(s) Outcome: Progressing   Problem: Activity: Goal: Ability to ambulate and perform ADLs will improve Outcome: Progressing   Problem: Clinical Measurements: Goal: Postoperative complications will be avoided or minimized Outcome: Progressing   Problem: Self-Concept: Goal: Ability to maintain and perform role responsibilities to the fullest extent possible will improve Outcome: Progressing   Problem: Pain Management: Goal: Pain level will decrease Outcome: Progressing   Problem: Health Behavior/Discharge Planning: Goal: Ability to manage health-related needs will improve Outcome: Progressing   Problem: Clinical Measurements: Goal: Ability to maintain clinical measurements within normal limits will improve Outcome: Progressing Goal: Will remain free from infection Outcome: Progressing Goal: Diagnostic test results will improve Outcome: Progressing Goal: Respiratory complications will improve Outcome: Progressing Goal: Cardiovascular complication will be avoided Outcome: Progressing   Problem: Activity: Goal: Risk for activity intolerance will decrease Outcome: Progressing   Problem: Nutrition: Goal: Adequate nutrition will be maintained Outcome: Progressing   Problem: Coping: Goal: Level of anxiety will decrease Outcome: Progressing   Problem: Elimination: Goal: Will not experience complications related to bowel motility Outcome: Progressing Goal: Will not experience complications related to urinary retention Outcome: Progressing   Problem: Pain Managment: Goal: General experience of comfort will improve Outcome: Progressing   Problem: Safety: Goal: Ability to remain free from injury will improve Outcome: Progressing   Problem: Skin Integrity: Goal: Risk  for impaired skin integrity will decrease Outcome: Progressing

## 2019-05-01 NOTE — Progress Notes (Signed)
Progress Note    George [name]  [id number] DOB: 1923-08-24  DOA: 04/22/2019 PCP: Merrilee Seashore, MD         Assessment/Plan:   Principal Problem:   Hip fracture [facility]) Active Problems:   Chronic atrial fibrillation ([place])   HLD (hyperlipidemia)   AKI (acute kidney injury) ([place])    Left periprosthetic hip fracture s/p ORIF on 04/27/2019  Orthopedics on board  Pain management/DVT PPx as per orthopedics  PT/OT as per orthopedics  Leukocytosis Somewhat chronic, unknown etiology Afebrile Procalcitonin negative UA unremarkable, UC no growth Chest x-ray showed improved mild atelectasis at the left lung base, no acute cardiopulmonary disease otherwise  Acute blood loss anemia Hemoglobin dropped to 6.6, s/p transfused 1 unit of packed red blood cell on 12/22 Patient also received 2 units of PRBC during surgery  Monitor closely post op Daily CBC  UTI 2/2 Enterococcus faecalis  Currently afebrile, with leukocytosis Urine culture showed Enterococcus faecalis Completed amoxicillin for total of 5 days Daily CBC, will trend  Acute kidney injury Improving s/p IVF Daily BMP  Chronic atrial fibrillation Rate controlled Continue cardizem Echo showed EF of 65 to 70%, left ventricular diastolic function could not be evaluated Continue Eliquis  Diabetes mellitus type 2 Last A1c 6.5 SSI, Accu-Cheks, hypoglycemic protocol  Possible acute on chronic diastolic HF BNP [id number], some wheezing noted ECHO as above Chest x-ray showed increased vascular congestion, repeat showed improvement Give 1 dose of IV Lasix on 04/29/2019 Strict I's and O's, daily weight  HLD Continue crestor  History of prostate cancer Outpt follow up   Family Communication/Anticipated D/C date and plan/Code Status   DVT prophylaxis: Eliquis Code Status: DNR Family Communication: Plan discussed with the patient Disposition Plan: SNF      Subjective:   Patient seen and examined  at bedside.  Denies any new complaints, denies any chest pain, shortness of breath, abdominal pain, nausea/vomiting, fever/chills.  Objective:    Vitals:   04/30/19 1346 04/30/19 2324 05/01/19 0532 05/01/19 0718  BP: (!) 148/71 (!) 173/74 (!) 159/92   Pulse: 88 97 89   Resp: 18 18 (!) 28 19  Temp: (!) 97.5 F (36.4 C) 98.3 F (36.8 C) 98 F (36.7 C)   TempSrc: Oral Oral Oral   SpO2: 96% 98% 100%   Weight:   88.4 kg   Height:        Intake/Output Summary (Last 24 hours) at 05/01/2019 1347 Last data filed at 05/01/2019 1304 Gross per 24 hour  Intake 360 ml  Output 1420 ml  Net -1060 ml   Filed Weights   04/22/19 2200 04/27/19 1212 05/01/19 0532  Weight: 79.4 kg 79.4 kg 88.4 kg    Exam:  General: NAD   Cardiovascular: S1, S2 present  Respiratory:  Diminished air entry bilaterally  Abdomen: Soft, nontender, nondistended, bowel sounds present  Musculoskeletal: No bilateral pedal edema noted, LLE dressing C/D/I  Skin: Normal  Psychiatry: Normal mood     Data Reviewed:   I have personally reviewed following labs and imaging studies:  Labs: Labs show the following:   Basic Metabolic Panel: Recent Labs  Lab 04/24/19 1919 04/25/19 0449 04/25/19 0632 04/26/19 0526 04/27/19 1613 04/28/19 0444 04/29/19 0534 04/30/19 0603 05/01/19 0546  NA 136 QUESTIONABLE RESULTS, RECOMMEND RECOLLECT TO VERIFY 136   < > 141 141 140 142 141  K 5.0 QUESTIONABLE RESULTS, RECOMMEND RECOLLECT TO VERIFY 4.5   < > 4.5 4.7 4.6 4.4 4.3  CL 102  QUESTIONABLE RESULTS, RECOMMEND RECOLLECT TO VERIFY 103   < > 105 108 109 104 99  CO2 25 QUESTIONABLE RESULTS, RECOMMEND RECOLLECT TO VERIFY 25  --   --  24 22 27 27   GLUCOSE 172* QUESTIONABLE RESULTS, RECOMMEND RECOLLECT TO VERIFY 158*   < > 142* 140* 249* 206* 177*  BUN 55* QUESTIONABLE RESULTS, RECOMMEND RECOLLECT TO VERIFY 56*   < > 43* 44* 47* 53* 47*  CREATININE 3.07* QUESTIONABLE RESULTS, RECOMMEND RECOLLECT TO VERIFY 2.73*  --   1.60* 1.56* 1.51* 1.61* 1.35*  CALCIUM 8.2* QUESTIONABLE RESULTS, RECOMMEND RECOLLECT TO VERIFY 8.0*  --   --  8.1* 8.3* 8.4* 8.0*  MG  --  QUESTIONABLE RESULTS, RECOMMEND RECOLLECT TO VERIFY  --   --   --   --   --   --   --   PHOS 2.9 QUESTIONABLE RESULTS, RECOMMEND RECOLLECT TO VERIFY 2.7  --   --   --   --   --   --    < > = values in this interval not displayed.   GFR Estimated Creatinine Clearance: 38.3 mL/min (A) (by C-G formula based on SCr of 1.35 mg/dL (H)). Liver Function Tests: Recent Labs  Lab 04/24/19 1919 04/25/19 0449 04/25/19 [id number]  ALBUMIN 2.7* QUESTIONABLE RESULTS, RECOMMEND RECOLLECT TO VERIFY 2.5*   No results for input(s): LIPASE, AMYLASE in the last 168 hours. No results for input(s): AMMONIA in the last 168 hours. Coagulation profile No results for input(s): INR, PROTIME in the last 168 hours.  CBC: Recent Labs  Lab 04/25/19 0632 04/26/19 0526 04/27/19 1017 04/27/19 1613 04/28/19 0444 04/29/19 0534 04/30/19 0603 05/01/19 0546  WBC 18.5*  --  18.0*  --  16.6* 21.6* 24.4* 21.6*  NEUTROABS 15.3*  --   --   --  12.6* 18.0* 19.8* 16.7*  HGB 7.7*  --  8.5* 10.5* 10.5* 9.1* 9.6* 10.7*  HCT 24.2*   < > 26.7* 31.0* 32.9* 29.6* 30.2* 33.9*  MCV 109.5*  --  106.4*  --  100.3* 100.7* 99.7 100.9*  PLT 133*  --  159  --  147* 188 227 280   < > = values in this interval not displayed.   Cardiac Enzymes: No results for input(s): CKTOTAL, CKMB, CKMBINDEX, TROPONINI in the last 168 hours. BNP (last 3 results) No results for input(s): PROBNP in the last 8760 hours. CBG: Recent Labs  Lab 04/30/19 1205 04/30/19 1717 05/01/19 0004 05/01/19 0015 05/01/19 0809  GLUCAP 202* 447* 157* 152* 155*   D-Dimer: No results for input(s): DDIMER in the last 72 hours. Hgb A1c: No results for input(s): HGBA1C in the last 72 hours. Lipid Profile: No results for input(s): CHOL, HDL, LDLCALC, TRIG, CHOLHDL, LDLDIRECT in the last 72 hours. Thyroid function studies: No  results for input(s): TSH, T4TOTAL, T3FREE, THYROIDAB in the last 72 hours.  Invalid input(s): FREET3 Anemia work up: No results for input(s): VITAMINB12, FOLATE, FERRITIN, TIBC, IRON, RETICCTPCT in the last 72 hours. Sepsis Labs: Recent Labs  Lab 04/28/19 0444 04/29/19 0534 04/30/19 0603 04/30/19 0812 05/01/19 0546  PROCALCITON  --   --   --  0.20 <0.10  WBC 16.6* 21.6* 24.4*  --  21.6*    Microbiology Recent Results (from the past 240 hour(s))  SARS CORONAVIRUS 2 (TAT 6-24 HRS) Nasopharyngeal Nasopharyngeal Swab     Status: None   Collection Time: 04/22/19  8:38 PM   Specimen: Nasopharyngeal Swab  Result Value Ref Range Status   SARS Coronavirus  2 NEGATIVE NEGATIVE Final    Comment: (NOTE) SARS-CoV-2 target nucleic acids are NOT DETECTED. The SARS-CoV-2 RNA is generally detectable in upper and lower respiratory specimens during the acute phase of infection. Negative results do not preclude SARS-CoV-2 infection, do not rule out co-infections with other pathogens, and should not be used as the sole basis for treatment or other patient management decisions. Negative results must be combined with clinical observations, patient history, and epidemiological information. The expected result is Negative. Fact Sheet for Patients: SugarRoll.be Fact Sheet for Healthcare Providers: https://www.woods-mathews.com/ This test is not yet approved or cleared by the [place] FDA and  has been authorized for detection and/or diagnosis of SARS-CoV-2 by FDA under an Emergency Use Authorization (EUA). This EUA will remain  in effect (meaning this test can be used) for the duration of the COVID-19 declaration under Section 56 4(b)(1) of the Act, 21 U.S.C. section 360bbb-3(b)(1), unless the authorization is terminated or revoked sooner. Performed at [facility], [place] [address] [place], [place] [address]   Surgical PCR screen     Status:  None   Collection Time: 04/24/19  9:00 AM   Specimen: Nasal Mucosa; Nasal Swab  Result Value Ref Range Status   MRSA, PCR NEGATIVE NEGATIVE Final   Staphylococcus aureus NEGATIVE NEGATIVE Final    Comment: (NOTE) The Xpert SA Assay (FDA approved for NASAL specimens in patients 67 years of age and older), is one component of a comprehensive surveillance program. It is not intended to diagnose infection nor to guide or monitor treatment. Performed at [facility], [place] [address] [place], [place] [address]   MRSA PCR Screening     Status: None   Collection Time: 04/24/19 10:29 AM   Specimen: Nasopharyngeal  Result Value Ref Range Status   MRSA by PCR NEGATIVE NEGATIVE Final    Comment:        The GeneXpert MRSA Assay (FDA approved for NASAL specimens only), is one component of a comprehensive MRSA colonization surveillance program. It is not intended to diagnose MRSA infection nor to guide or monitor treatment for MRSA infections. Performed at [facility], [place] [address] [place], [place] [address]   Culture, Urine     Status: Abnormal   Collection Time: 04/24/19 11:21 AM   Specimen: Urine, Clean Catch  Result Value Ref Range Status   Specimen Description   Final    URINE, CLEAN CATCH Performed at [facility], [place] [address] [place], [place] [address]    Special Requests   Final    NONE Performed at [facility], [place] [address] [place], [place] [address]    Culture >=100,000 COLONIES/mL ENTEROCOCCUS FAECALIS (A)  Final   Report Status 04/26/2019 FINAL  Final   Organism ID, Bacteria ENTEROCOCCUS FAECALIS (A)  Final      Susceptibility   Enterococcus faecalis - MIC*    AMPICILLIN <=2 SENSITIVE Sensitive     NITROFURANTOIN <=16 SENSITIVE Sensitive     VANCOMYCIN 1 SENSITIVE Sensitive     * >=100,000 COLONIES/mL ENTEROCOCCUS FAECALIS  Culture, blood (routine x 2)     Status: None    Collection Time: 04/24/19 11:35 AM   Specimen: BLOOD  Result Value Ref Range Status   Specimen Description   Final    BLOOD RIGHT ANTECUBITAL Performed at [facility], [place] [address], [place], [place] [address]    Special Requests   Final    BOTTLES  DRAWN AEROBIC AND ANAEROBIC Blood Culture adequate volume Performed at [facility], [place] [address], [place], [place] [address]    Culture   Final    NO GROWTH 5 DAYS Performed at [facility], [place] [address], [place], [place] [address]    Report Status 04/29/2019 FINAL  Final  Culture, blood (routine x 2)     Status: None   Collection Time: 04/24/19 11:41 AM   Specimen: BLOOD RIGHT ARM  Result Value Ref Range Status   Specimen Description   Final    BLOOD RIGHT ARM Performed at [facility], [place] [address] [place], [place] [address]    Special Requests   Final    BOTTLES DRAWN AEROBIC AND ANAEROBIC Blood Culture adequate volume Performed at [facility], [place] [address] [place], [place] [address]    Culture   Final    NO GROWTH 5 DAYS Performed at [facility], [place] [address] [place], [place] [address]    Report Status 04/29/2019 FINAL  Final  Expectorated sputum assessment w rflx to resp cult     Status: None   Collection Time: 04/25/19  6:38 PM   Specimen: Sputum  Result Value Ref Range Status   Specimen Description SPUTUM  Final   Special Requests NONE  Final   Sputum evaluation   Final    THIS SPECIMEN IS ACCEPTABLE FOR SPUTUM CULTURE Performed at [facility], [place] [address], [place], [place] [address]    Report Status 04/25/2019 FINAL  Final  Culture, respiratory     Status: None   Collection Time: 04/25/19  6:38 PM   Specimen: SPU  Result Value Ref Range Status   Specimen Description   Final    SPUTUM Performed at [place] [address], [place], [place]  [address]    Special Requests   Final    NONE Reflexed from [id number] Performed at [place] [address], [place], [place] [address]    Gram Stain   Final    FEW WBC PRESENT, PREDOMINANTLY PMN MODERATE GRAM NEGATIVE RODS MODERATE GRAM POSITIVE COCCI IN CLUSTERS    Culture   Final    Consistent with normal respiratory flora. Performed at [facility], [place] [address], [place], [place] [address]    Report Status 04/27/2019 FINAL  Final  Culture, Urine     Status: None   Collection Time: 04/30/19  1:34 PM   Specimen: Urine, Clean Catch  Result Value Ref Range Status   Specimen Description   Final    URINE, CLEAN CATCH Performed at [facility], [place] [address], [place], [place] [address]    Special Requests   Final    NONE Performed at [facility], [place] [address], [place], [place] [address]    Culture   Final    NO GROWTH Performed at [facility], [place] [address], [place], [place] [address]    Report Status 05/01/2019 FINAL  Final    Procedures and diagnostic studies:  DG CHEST PORT 1 VIEW  Result Date: 04/29/2019 CLINICAL DATA:  85 year old who is postop day 2 ORIF LEFT femur fracture, now with leukocytosis. EXAM: PORTABLE CHEST 1 VIEW COMPARISON:  04/27/2019 and earlier. FINDINGS: Suboptimal inspiration. Cardiac silhouette upper normal in size to slightly enlarged for AP portable technique, unchanged. Mild atelectasis at the LEFT lung base, improved since the examination  2 days ago. Resolution of the RIGHT basilar atelectasis since that examination. No new pulmonary parenchymal abnormalities. Pulmonary vascularity normal without evidence of pulmonary edema. IMPRESSION: 1. Mild atelectasis at the LEFT lung base, improved since the examination 2 days ago. 2. No acute cardiopulmonary disease otherwise. Electronically Signed   By: Evangeline Dakin M.D.   On: 04/29/2019 14:57   DG Abd Portable 1V  Result Date:  04/30/2019 CLINICAL DATA:  Constipation and abdominal pain. EXAM: PORTABLE ABDOMEN - 1 VIEW COMPARISON:  CT of the abdomen and pelvis on 04/26/2019 FINDINGS: No evidence of overt bowel obstruction. Mild gas in nondilated small bowel may be consistent with mild ileus. There is fecal material in the colon which does not appear dilated. No gross signs of free air. No abnormal calcifications. Clips are seen related to prior cholecystectomy. Degenerative disease of the lumbar spine. IMPRESSION: Mild gas in nondilated small bowel may be consistent with mild ileus. Electronically Signed   By: Aletta Edouard M.D.   On: 04/30/2019 08:44    Medications:   . apixaban  2.5 mg Oral Q12H  . diltiazem  30 mg Oral Q6H  . docusate sodium  100 mg Oral BID  . feeding supplement  1 Container Oral Q24H  . feeding supplement (ENSURE ENLIVE)  237 mL Oral BID BM  . insulin aspart  0-6 Units Subcutaneous TID WC  . multivitamin with minerals  1 tablet Oral Daily  . polyethylene glycol  17 g Oral BID  . rosuvastatin  10 mg Oral Daily   Continuous Infusions: . methocarbamol (ROBAXIN) IV 500 mg (04/27/19 1644)     LOS: 9 days   Alma Friendly, MD  [facility]    05/01/2019, 1:47 PM

## 2019-05-01 NOTE — Progress Notes (Signed)
Patient has been confused most of this shift and has refused VS with multiple attempts, instructed on need for monitoring his VS and patient responded that he is dying and he does not need to be bothered with that.

## 2019-05-01 NOTE — Progress Notes (Signed)
    Subjective:  Patient reports pain as mild to moderate.  Denies N/V/CP/SOB. No c/o.  Objective:   VITALS:   Vitals:   04/30/19 0541 04/30/19 1346 04/30/19 2324 05/01/19 0532  BP: 137/68 (!) 148/71 (!) 173/74 (!) 159/92  Pulse: 79 88 97 89  Resp: 18 18 18  (!) 28  Temp: 98 F (36.7 C) (!) 97.5 F (36.4 C) 98.3 F (36.8 C) 98 F (36.7 C)  TempSrc: Oral Oral Oral Oral  SpO2: 96% 96% 98% 100%  Weight:    88.4 kg  Height:        NAD ABD soft Intact pulses distally Dorsiflexion/Plantar flexion intact Incision: dressing C/D/I Compartment soft KI intact Reports baseline altered sensation due to neuropathy - unchanged per patient  Lab Results  Component Value Date   WBC 21.6 (H) 05/01/2019   HGB 10.7 (L) 05/01/2019   HCT 33.9 (L) 05/01/2019   MCV 100.9 (H) 05/01/2019   PLT 280 05/01/2019   BMET    Component Value Date/Time   NA 141 05/01/2019 0546   K 4.3 05/01/2019 0546   CL 99 05/01/2019 0546   CO2 27 05/01/2019 0546   GLUCOSE 177 (H) 05/01/2019 0546   BUN 47 (H) 05/01/2019 0546   CREATININE 1.35 (H) 05/01/2019 0546   CALCIUM 8.0 (L) 05/01/2019 0546   GFRNONAA 45 (L) 05/01/2019 0546   GFRAA 52 (L) 05/01/2019 0546     Assessment/Plan: 4 Days Post-Op   Principal Problem:   Hip fracture ([facility]) Active Problems:   Chronic atrial fibrillation ([facility])   HLD (hyperlipidemia)   AKI (acute kidney injury) ([facility])   25% WB LLE with walker DVT ppx: Eliquis, SCDs, TEDS PO pain control PT/OT Dispo: D/C planning   Hilton Cork Jahmir Salo 05/01/2019, 9:23 AM   Rod Can, MD [phone] [place] [facility] now [facility]  [facility] [address], [place], [place] [address] Phone: [phone] [email or web address] Facebook  [employer]

## 2019-05-02 LAB — BASIC METABOLIC PANEL
Anion gap: 7 (ref 5–15)
BUN: 43 mg/dL — ABNORMAL HIGH (ref 8–23)
CO2: 28 mmol/L (ref 22–32)
Calcium: 8.1 mg/dL — ABNORMAL LOW (ref 8.9–10.3)
Chloride: 105 mmol/L (ref 98–111)
Creatinine, Ser: 1.3 mg/dL — ABNORMAL HIGH (ref 0.61–1.24)
GFR calc Af Amer: 55 mL/min — ABNORMAL LOW (ref >60)
GFR calc non Af Amer: 47 mL/min — ABNORMAL LOW (ref >60)
Glucose, Bld: 225 mg/dL — ABNORMAL HIGH (ref 70–99)
Potassium: 4.6 mmol/L (ref 3.5–5.1)
Sodium: 140 mmol/L (ref 135–145)

## 2019-05-02 LAB — GLUCOSE, CAPILLARY
Glucose-Capillary: 191 mg/dL — ABNORMAL HIGH (ref 70–99)
Glucose-Capillary: 221 mg/dL — ABNORMAL HIGH (ref 70–99)
Glucose-Capillary: 130 mg/dL — ABNORMAL HIGH (ref 70–99)
Glucose-Capillary: 237 mg/dL — ABNORMAL HIGH (ref 70–99)

## 2019-05-02 LAB — CBC WITH DIFFERENTIAL/PLATELET
Abs Immature Granulocytes: 0.96 10*3/uL — ABNORMAL HIGH (ref 0.00–0.07)
Basophils Absolute: 0.1 10*3/uL (ref 0.0–0.1)
Basophils Relative: 1 %
Eosinophils Absolute: 0.1 10*3/uL (ref 0.0–0.5)
Eosinophils Relative: 1 %
HCT: 33.1 % — ABNORMAL LOW (ref 39.0–52.0)
Hemoglobin: 10.2 g/dL — ABNORMAL LOW (ref 13.0–17.0)
Immature Granulocytes: 5 %
Lymphocytes Relative: 6 %
Lymphs Abs: 1.2 10*3/uL (ref 0.7–4.0)
MCH: 31.1 pg (ref 26.0–34.0)
MCHC: 30.8 g/dL (ref 30.0–36.0)
MCV: 100.9 fL — ABNORMAL HIGH (ref 80.0–100.0)
Monocytes Absolute: 1.2 10*3/uL — ABNORMAL HIGH (ref 0.1–1.0)
Monocytes Relative: 6 %
Neutro Abs: 15.7 10*3/uL — ABNORMAL HIGH (ref 1.7–7.7)
Neutrophils Relative %: 81 %
Platelets: 302 10*3/uL (ref 150–400)
RBC: 3.28 MIL/uL — ABNORMAL LOW (ref 4.22–5.81)
RDW: 19.9 % — ABNORMAL HIGH (ref 11.5–15.5)
WBC: 19.3 10*3/uL — ABNORMAL HIGH (ref 4.0–10.5)
nRBC: 0.5 % — ABNORMAL HIGH (ref 0.0–0.2)

## 2019-05-02 MED ORDER — LACTATED RINGERS IV SOLN: INTRAVENOUS | Status: DC

## 2019-05-02 MED ORDER — LACTATED RINGERS IV SOLN: INTRAVENOUS | Status: AC

## 2019-05-02 NOTE — Progress Notes (Signed)
Progress Note    George [name]  [id number] DOB: Nov 13, 1923  DOA: 04/22/2019 PCP: Merrilee Seashore, MD         Assessment/Plan:   Principal Problem:   Hip fracture [facility]) Active Problems:   Chronic atrial fibrillation ([place])   HLD (hyperlipidemia)   AKI (acute kidney injury) ([place])    Left periprosthetic hip fracture s/p ORIF on 04/27/2019  Orthopedics on board  Pain management/DVT PPx as per orthopedics  PT/OT as per orthopedics  Leukocytosis Somewhat chronic, unknown etiology Afebrile Procalcitonin negative UA unremarkable, UC no growth Chest x-ray showed improved mild atelectasis at the left lung base, no acute cardiopulmonary disease otherwise Can be followed as an outpatient  Acute blood loss anemia Hemoglobin dropped to 6.6, s/p transfused 1 unit of packed red blood cell on 12/22 Patient also received 2 units of PRBC during surgery  Monitor closely post op Daily CBC  UTI 2/2 Enterococcus faecalis  Currently afebrile, with leukocytosis Urine culture showed Enterococcus faecalis Completed amoxicillin for total of 5 days Daily CBC, will trend  Acute kidney injury Improving s/p IVF Daily BMP  Chronic atrial fibrillation Rate controlled Continue cardizem Echo showed EF of 65 to 70%, left ventricular diastolic function could not be evaluated Continue Eliquis  Diabetes mellitus type 2 Last A1c 6.5 SSI, Accu-Cheks, hypoglycemic protocol  Possible acute on chronic diastolic HF BNP [id number], some wheezing noted ECHO as above Chest x-ray showed increased vascular congestion, repeat showed improvement Give 1 dose of IV Lasix on 04/29/2019 Strict I's and O's, daily weight  HLD Continue crestor  History of prostate cancer Outpt follow up   Family Communication/Anticipated D/C date and plan/Code Status   DVT prophylaxis: Eliquis Code Status: DNR Family Communication: Plan discussed with the patient Disposition Plan: SNF when bed is  available      Subjective:   Denies any shortness of breath, chest pain, nausea or vomiting  Objective:    Vitals:   05/02/19 0406 05/02/19 1108 05/02/19 1343 05/02/19 2011  BP: (!) 146/76 (!) 145/72 (!) 123/91 136/66  Pulse: 94  89 84  Resp: 20  20 16   Temp: 97.6 F (36.4 C)  98.7 F (37.1 C) 98.4 F (36.9 C)  TempSrc: Oral   Oral  SpO2: 97%  96% 95%  Weight: 86.8 kg     Height:        Intake/Output Summary (Last 24 hours) at 05/02/2019 2130 Last data filed at 05/02/2019 1740 Gross per 24 hour  Intake 460 ml  Output 1400 ml  Net -940 ml   Filed Weights   04/27/19 1212 05/01/19 0532 05/02/19 0406  Weight: 79.4 kg 88.4 kg 86.8 kg    Exam: General exam: Alert, awake, oriented x 3 Respiratory system: Clear to auscultation. Respiratory effort normal. Cardiovascular system:RRR. No murmurs, rubs, gallops. Gastrointestinal system: Abdomen is nondistended, soft and nontender. No organomegaly or masses felt. Normal bowel sounds heard. Central nervous system: Alert and oriented. No focal neurological deficits. Extremities: No C/C/E, +pedal pulses Skin: No rashes, lesions or ulcers Psychiatry: Judgement and insight appear normal. Mood & affect appropriate.      Data Reviewed:   I have personally reviewed following labs and imaging studies:  Labs: Labs show the following:   Basic Metabolic Panel: Recent Labs  Lab 04/28/19 0444 04/29/19 0534 04/30/19 0603 05/01/19 0546 05/02/19 0452  NA 141 140 142 141 140  K 4.7 4.6 4.4 4.3 4.6  CL 108 109 104 99 105  CO2 24  22 27 27 28   GLUCOSE 140* 249* 206* 177* 225*  BUN 44* 47* 53* 47* 43*  CREATININE 1.56* 1.51* 1.61* 1.35* 1.30*  CALCIUM 8.1* 8.3* 8.4* 8.0* 8.1*   GFR Estimated Creatinine Clearance: 36.7 mL/min (A) (by C-G formula based on SCr of 1.3 mg/dL (H)). Liver Function Tests: No results for input(s): AST, ALT, ALKPHOS, BILITOT, PROT, ALBUMIN in the last 168 hours. No results for input(s): LIPASE,  AMYLASE in the last 168 hours. No results for input(s): AMMONIA in the last 168 hours. Coagulation profile No results for input(s): INR, PROTIME in the last 168 hours.  CBC: Recent Labs  Lab 04/28/19 0444 04/29/19 0534 04/30/19 0603 05/01/19 0546 05/02/19 0452  WBC 16.6* 21.6* 24.4* 21.6* 19.3*  NEUTROABS 12.6* 18.0* 19.8* 16.7* 15.7*  HGB 10.5* 9.1* 9.6* 10.7* 10.2*  HCT 32.9* 29.6* 30.2* 33.9* 33.1*  MCV 100.3* 100.7* 99.7 100.9* 100.9*  PLT 147* 188 227 280 302   Cardiac Enzymes: No results for input(s): CKTOTAL, CKMB, CKMBINDEX, TROPONINI in the last 168 hours. BNP (last 3 results) No results for input(s): PROBNP in the last 8760 hours. CBG: Recent Labs  Lab 05/01/19 2054 05/02/19 0729 05/02/19 1141 05/02/19 1611 05/02/19 2104  GLUCAP 175* 191* 221* 130* 237*   D-Dimer: No results for input(s): DDIMER in the last 72 hours. Hgb A1c: No results for input(s): HGBA1C in the last 72 hours. Lipid Profile: No results for input(s): CHOL, HDL, LDLCALC, TRIG, CHOLHDL, LDLDIRECT in the last 72 hours. Thyroid function studies: No results for input(s): TSH, T4TOTAL, T3FREE, THYROIDAB in the last 72 hours.  Invalid input(s): FREET3 Anemia work up: No results for input(s): VITAMINB12, FOLATE, FERRITIN, TIBC, IRON, RETICCTPCT in the last 72 hours. Sepsis Labs: Recent Labs  Lab 04/29/19 0534 04/30/19 0603 04/30/19 0812 05/01/19 0546 05/02/19 0452  PROCALCITON  --   --  0.20 <0.10  --   WBC 21.6* 24.4*  --  21.6* 19.3*    Microbiology Recent Results (from the past 240 hour(s))  Surgical PCR screen     Status: None   Collection Time: 04/24/19  9:00 AM   Specimen: Nasal Mucosa; Nasal Swab  Result Value Ref Range Status   MRSA, PCR NEGATIVE NEGATIVE Final   Staphylococcus aureus NEGATIVE NEGATIVE Final    Comment: (NOTE) The Xpert SA Assay (FDA approved for NASAL specimens in patients 58 years of age and older), is one component of a comprehensive surveillance  program. It is not intended to diagnose infection nor to guide or monitor treatment. Performed at [facility], [place] [address], [place], [place] [address]   MRSA PCR Screening     Status: None   Collection Time: 04/24/19 10:29 AM   Specimen: Nasopharyngeal  Result Value Ref Range Status   MRSA by PCR NEGATIVE NEGATIVE Final    Comment:        The GeneXpert MRSA Assay (FDA approved for NASAL specimens only), is one component of a comprehensive MRSA colonization surveillance program. It is not intended to diagnose MRSA infection nor to guide or monitor treatment for MRSA infections. Performed at [facility], [place] [address], [place], [place] [address]   Culture, Urine     Status: Abnormal   Collection Time: 04/24/19 11:21 AM   Specimen: Urine, Clean Catch  Result Value Ref Range Status   Specimen Description   Final    URINE, CLEAN CATCH Performed at [facility], [place] [address] [place], [place] [address]  Special Requests   Final    NONE Performed at [facility], [place] [address] [place], [place] [address]    Culture >=100,000 COLONIES/mL ENTEROCOCCUS FAECALIS (A)  Final   Report Status 04/26/2019 FINAL  Final   Organism ID, Bacteria ENTEROCOCCUS FAECALIS (A)  Final      Susceptibility   Enterococcus faecalis - MIC*    AMPICILLIN <=2 SENSITIVE Sensitive     NITROFURANTOIN <=16 SENSITIVE Sensitive     VANCOMYCIN 1 SENSITIVE Sensitive     * >=100,000 COLONIES/mL ENTEROCOCCUS FAECALIS  Culture, blood (routine x 2)     Status: None   Collection Time: 04/24/19 11:35 AM   Specimen: BLOOD  Result Value Ref Range Status   Specimen Description   Final    BLOOD RIGHT ANTECUBITAL Performed at [facility], [place] [address] [place], [place] [address]    Special Requests   Final    BOTTLES DRAWN AEROBIC AND ANAEROBIC Blood Culture adequate volume Performed at [facility], [place] [address], [place], [place] [address]    Culture   Final    NO GROWTH 5 DAYS Performed at [facility], [place] [address] [place], [place] [address]    Report Status 04/29/2019 FINAL  Final  Culture, blood (routine x 2)     Status: None   Collection Time: 04/24/19 11:41 AM   Specimen: BLOOD RIGHT ARM  Result Value Ref Range Status   Specimen Description   Final    BLOOD RIGHT ARM Performed at [facility], [place] [address] [place], [place] [address]    Special Requests   Final    BOTTLES DRAWN AEROBIC AND ANAEROBIC Blood Culture adequate volume Performed at [facility], [place] [address], [place], [place] [address]    Culture   Final    NO GROWTH 5 DAYS Performed at [facility], [place] [address], [place], [place] [address]    Report Status 04/29/2019 FINAL  Final  Expectorated sputum assessment w rflx to resp cult     Status: None   Collection Time: 04/25/19  6:38 PM   Specimen: Sputum  Result Value Ref Range Status   Specimen Description SPUTUM  Final   Special Requests NONE  Final   Sputum evaluation   Final    THIS SPECIMEN IS ACCEPTABLE FOR SPUTUM CULTURE Performed at [facility], [place] [address], [place], [place] [address]    Report Status 04/25/2019 FINAL  Final  Culture, respiratory     Status: None   Collection Time: 04/25/19  6:38 PM   Specimen: SPU  Result Value Ref Range Status   Specimen Description   Final    SPUTUM Performed at [place] [address] [place], [place] [address]    Special Requests   Final    NONE Reflexed from [id number] Performed at [place] [address] [place], [place] [address]    Gram Stain   Final    FEW WBC PRESENT, PREDOMINANTLY PMN MODERATE GRAM NEGATIVE RODS MODERATE GRAM POSITIVE COCCI IN CLUSTERS    Culture   Final    Consistent with normal respiratory flora. Performed  at [facility], [place] [address] [place], [place] [address]    Report Status 04/27/2019 FINAL  Final  Culture, Urine     Status: None   Collection Time: 04/30/19  1:34 PM   Specimen: Urine,  Clean Catch  Result Value Ref Range Status   Specimen Description   Final    URINE, CLEAN CATCH Performed at [facility], [place] [address], [place], [place] [address]    Special Requests   Final    NONE Performed at [facility], [place] [address], [place], [place] [address]    Culture   Final    NO GROWTH Performed at [facility], [place] [address], [place], [place] [address]    Report Status 05/01/2019 FINAL  Final    Procedures and diagnostic studies:  No results found.  Medications:   . apixaban  5 mg Oral Q12H  . diltiazem  30 mg Oral Q6H  . docusate sodium  100 mg Oral BID  . feeding supplement  1 Container Oral Q24H  . feeding supplement (ENSURE ENLIVE)  237 mL Oral BID BM  . insulin aspart  0-6 Units Subcutaneous TID WC  . multivitamin with minerals  1 tablet Oral Daily  . polyethylene glycol  17 g Oral BID  . rosuvastatin  10 mg Oral Daily   Continuous Infusions: . lactated ringers    . methocarbamol (ROBAXIN) IV 500 mg (04/27/19 1644)     LOS: 10 days   Kathie Dike, MD  [facility]    05/02/2019, 9:30 PM

## 2019-05-02 NOTE — Progress Notes (Signed)
Physical Therapy Treatment Patient Details Name: George [name] MRN: [id number] DOB: [birth year] Today's Date: 05/02/2019    History of Present Illness Pt is 85 yo male with history of afib, hyperlipidemia, OA, osteoporosis, and prostate CA (see full PMH in H and P) who presented to the ED with L hip pain after twisting it while walking (did not fall).  Pt found to have L periprosthetic femur fracture and is now  s/p ORIF on 04/27/19.    PT Comments    Pt demonstrating some improvement in supine/sit, but remained limited due to confusion/agitation and inability to maintain PWB.  He required mod A x 2 to EOB and for bed mobility.  Cont POC.    Follow Up Recommendations  SNF     Equipment Recommendations  None recommended by PT    Recommendations for Other Services       Precautions / Restrictions Precautions Precautions: Fall Required Braces or Orthoses: Knee Immobilizer - Left Knee Immobilizer - Left: On at all times Restrictions Weight Bearing Restrictions: Yes LLE Weight Bearing: Partial weight bearing LLE Partial Weight Bearing Percentage or Pounds: 25    Mobility  Bed Mobility Overal bed mobility: Needs Assistance Bed Mobility: Rolling Rolling: Mod assist;+2 for physical assistance   Supine to sit:;Mod assist;+2 for physical assistance  Sit to Supine: Max A x 2; limited due to confusion     General bed mobility comments: increased time: cues to reach for bed rail; assist for L LE and to elevate trunk  Transfers Overall transfer level: Needs assistance Equipment used: Rolling walker (2 wheeled) Transfers: Sit to/from Stand Sit to Stand: Max assist;+2 physical assistance;From elevated surface         General transfer comment: Attempted sit to stand x 2 and pt able to partially stand.  Pt having BM.  Attempted to clean in standing but pt not finished and unable to stand up enough.  Transferred back to bed for cleaning.  Ambulation/Gait              General Gait Details: deferred: unable physically and unable to maintain [facility]   Stairs             Wheelchair Mobility    Modified Rankin (Stroke Patients Only)       Balance Overall balance assessment: Needs assistance Sitting-balance support: Bilateral upper extremity supported;Feet supported Sitting balance-Leahy Scale: Fair Sitting balance - Comments: Pt became more agitated and confused at EOB, decreased ability to follow commands.  Unable to get BP at EOB but BP was 144/72 by time pt returned to supine.  O2 97% and HR 94 bpm.   Standing balance support: Bilateral upper extremity supported;During functional activity Standing balance-Leahy Scale: Zero                              Cognition Arousal/Alertness: Awake/alert Behavior During Therapy: Anxious;Agitated(mild agitation but cooperative)   Area of Impairment: Orientation;Following commands;Safety/judgement;Problem solving                 Orientation Level: Disoriented to;Place;Time;Situation     Following Commands: Follows one step commands inconsistently Safety/Judgement: Decreased awareness of safety   Problem Solving: Slow processing;Difficulty sequencing;Requires verbal cues;Requires tactile cues General Comments: Pt stating "what is the goal, why do we need to get me cleaned up, to go to a photo shoot?" when tried to explain he had a bowel movement and needed cleaning.  Additionally, pt stating "I can't  do it, I just want to die."      Exercises      General Comments        Pertinent Vitals/Pain Pain Assessment: Faces Faces Pain Scale: Hurts little more Pain Location: L hip with transfers Pain Descriptors / Indicators: Grimacing;Discomfort Pain Intervention(s): Limited activity within patient's tolerance;Repositioned    Home Living                      Prior Function            PT Goals (current goals can now be found in the care plan section) Progress towards  PT goals: Progressing toward goals    Frequency    Min 3X/week      PT Plan Current plan remains appropriate    Co-evaluation              AM-PAC PT "6 Clicks" Mobility   Outcome Measure  Help needed turning from your back to your side while in a flat bed without using bedrails?: Total Help needed moving from lying on your back to sitting on the side of a flat bed without using bedrails?: Total Help needed moving to and from a bed to a chair (including a wheelchair)?: Total Help needed standing up from a chair using your arms (e.g., wheelchair or bedside chair)?: Total Help needed to walk in hospital room?: Total Help needed climbing 3-5 steps with a railing? : Total 6 Click Score: 6    End of Session Equipment Utilized During Treatment: Gait belt;Left knee immobilizer Activity Tolerance: Other (comment)(limited due to confusion) Patient left: in bed;with call bell/phone within reach;with bed alarm set Nurse Communication: Mobility status;Need for lift equipment;Precautions;Weight bearing status PT Visit Diagnosis: Unsteadiness on feet (R26.81);Muscle weakness (generalized) (M62.81);Other abnormalities of gait and mobility (R26.89)     Time: [id number] PT Time Calculation (min) (ACUTE ONLY): 29 min  Charges:  $Therapeutic Activity: 23-37 mins                     Maggie Font, PT Acute Rehab Services Pager [phone] [facility] [phone] [facility] [phone]    Karlton Lemon 05/02/2019, 1:53 PM

## 2019-05-02 NOTE — Plan of Care (Signed)
  Problem: Education: Goal: Verbalization of understanding the information provided (i.e., activity precautions, restrictions, etc) will improve Outcome: Progressing Goal: Individualized Educational Video(s) Outcome: Progressing   Problem: Activity: Goal: Ability to ambulate and perform ADLs will improve Outcome: Progressing   Problem: Clinical Measurements: Goal: Postoperative complications will be avoided or minimized Outcome: Progressing   Problem: Self-Concept: Goal: Ability to maintain and perform role responsibilities to the fullest extent possible will improve Outcome: Progressing   Problem: Pain Management: Goal: Pain level will decrease Outcome: Progressing   Problem: Health Behavior/Discharge Planning: Goal: Ability to manage health-related needs will improve Outcome: Progressing   Problem: Clinical Measurements: Goal: Ability to maintain clinical measurements within normal limits will improve Outcome: Progressing Goal: Will remain free from infection Outcome: Progressing Goal: Diagnostic test results will improve Outcome: Progressing Goal: Respiratory complications will improve Outcome: Progressing Goal: Cardiovascular complication will be avoided Outcome: Progressing   Problem: Activity: Goal: Risk for activity intolerance will decrease Outcome: Progressing   Problem: Nutrition: Goal: Adequate nutrition will be maintained Outcome: Progressing   Problem: Coping: Goal: Level of anxiety will decrease Outcome: Progressing   Problem: Elimination: Goal: Will not experience complications related to bowel motility Outcome: Progressing Goal: Will not experience complications related to urinary retention Outcome: Progressing   Problem: Pain Managment: Goal: General experience of comfort will improve Outcome: Progressing   Problem: Safety: Goal: Ability to remain free from injury will improve Outcome: Progressing   Problem: Skin Integrity: Goal: Risk  for impaired skin integrity will decrease Outcome: Progressing

## 2019-05-02 NOTE — Progress Notes (Signed)
  Speech Language Pathology Treatment: Dysphagia  Patient Details Name: George [name] MRN: [id number] DOB: 1924-01-22 Today's Date: 05/02/2019 Time: 8979-1504 SLP Time Calculation (min) (ACUTE ONLY): 16 min  Assessment / Plan / Recommendation Clinical Impression  Pt has an intermittent baseline cough that he describes as chronic. Upon review of chart, breath sounds continue to be documented as clear/diminished and he is afebrile. What is likely to be the most challenging part of intake for CJ at the moment is his positioning, needing Min cues to remain upright until he has finished his PO intake. He verbally acknowledges that it keeps him from getting "choked" when he sits upright, but it is just hard for him to be comfortable even with use of reverse Trendelenburg. Pt did however consume solids and liquids (alternating between the two with Mod I) with no additional coughing. Recommend continuing Dys 3 diet and thin liquids given esophageal hx. Acute SLP needs no longer indicated - will sign off for now. Please reorder if we can assist further.    HPI HPI: Patient is a 85 year old Caucasian male with past medical history significant for A. fib, hyperlipidemia, reflux, hiatal hernia, esophageal dilation, and prostate cancer. Patient was admitted with left periprosthetic femur fracture following a fall. Patient is awaiting an open reduction internal fixation of the left periprosthetic fracture that should be occurring Monday and/or Tuesday, as per orthopedic surgery team.      SLP Plan  All goals met       Recommendations  Diet recommendations: Dysphagia 3 (mechanical soft);Thin liquid Liquids provided via: Cup;Straw Medication Administration: Whole meds with liquid Supervision: Patient able to self feed Compensations: Slow rate;Small sips/bites;Effortful swallow;Clear throat intermittently;Multiple dry swallows after each bite/sip Postural Changes and/or Swallow Maneuvers: Seated upright  90 degrees;Upright 30-60 min after meal                Oral Care Recommendations: Oral care BID Follow up Recommendations: None SLP Visit Diagnosis: Dysphagia, unspecified (R13.10) Plan: All goals met       GO                 Osie Bond., M.A. [place] Acute Rehabilitation Services Pager [phone] Office [phone]  05/02/2019, 3:32 PM

## 2019-05-02 NOTE — Progress Notes (Addendum)
   Subjective: 5 Days Post-Op Procedure(s) (LRB): LEFT PERIPROSTHETIC FEMUR FRACTURE WITH ZIMMER CABLE AND PLATE (Left) Patient reports pain as mild.   Patient seen in rounds for Dr. Wynelle Link. Patient is oriented to time and place today. However, upon further discussion he showed some confusion regarding his being in the hospital (was asking questions about when church would begin, etc). Reports his pain is well controlled with the medications.  Plan is to go Skilled nursing facility after hospital stay.  Objective: Vital signs in last 24 hours: Temp:  [97.6 F (36.4 C)-98.7 F (37.1 C)] 98.7 F (37.1 C) (12/28 1343) Pulse Rate:  [86-94] 89 (12/28 1343) Resp:  [16-20] 20 (12/28 1343) BP: (123-151)/(72-91) 123/91 (12/28 1343) SpO2:  [95 %-97 %] 96 % (12/28 1343) Weight:  [86.8 kg] 86.8 kg (12/28 0406)  Intake/Output from previous day:  Intake/Output Summary (Last 24 hours) at 05/02/2019 1702 Last data filed at 05/02/2019 1217 Gross per 24 hour  Intake 580 ml  Output 1200 ml  Net -620 ml    Intake/Output this shift: Total I/O In: 340 [P.O.:340] Out: 300 [Urine:300]  Labs: Recent Labs    04/30/19 0603 05/01/19 0546 05/02/19 0452  HGB 9.6* 10.7* 10.2*   Recent Labs    05/01/19 0546 05/02/19 0452  WBC 21.6* 19.3*  RBC 3.36* 3.28*  HCT 33.9* 33.1*  PLT 280 302   Recent Labs    05/01/19 0546 05/02/19 0452  NA 141 140  K 4.3 4.6  CL 99 105  CO2 27 28  BUN 47* 43*  CREATININE 1.35* 1.30*  GLUCOSE 177* 225*  CALCIUM 8.0* 8.1*   Exam: General - Patient is Alert Extremity - Neurologically intact Neurovascular intact Sensation intact distally Dorsiflexion/Plantar flexion intact Dressing/Incision - clean, dry, healing well Motor Function - intact, moving foot and toes well on exam.   Past Medical History:  Diagnosis Date  . AKI (acute kidney injury) ([place]) 04/25/2019  . BPH (benign prostatic hypertrophy)   . Chronic atrial fibrillation ([facility])    CHA2DS2VASC score 3    . Esophageal ring   . GERD (gastroesophageal reflux disease)   . History of GI bleed   . Hypercholesteremia   . Osteoarthritis   . Osteoporosis   . Peripheral neuropathy   . Peripheral neuropathy   . Prostate cancer ([place])    treated with observation  . Trigger finger of both hands   . Vertigo   . Vitamin D deficiency     Assessment/Plan: 5 Days Post-Op Procedure(s) (LRB): LEFT PERIPROSTHETIC FEMUR FRACTURE WITH ZIMMER CABLE AND PLATE (Left) Principal Problem:   Hip fracture ([facility]) Active Problems:   Chronic atrial fibrillation ([facility])   HLD (hyperlipidemia)   AKI (acute kidney injury) ([place])  Estimated body mass index is 27.46 kg/m as calculated from the following:   Height as of this encounter: 5\' 10"  (1.778 m).   Weight as of this encounter: 86.8 kg.  DVT Prophylaxis - Back on normal dosing of Eliquis (5 mg BID) Continue with therapy PWB to the LLE (25%) Continue knee immobilizer until f/u in the office  From an orthopedic standpoint, he is ready for discharge to SNF once medically cleared. Orthopedics will sign off at this point. He will follow-up in the office 2 weeks following surgery.  Theresa Duty, PA-C Orthopedic Surgery 05/02/2019, 5:02 PM

## 2019-05-03 ENCOUNTER — Encounter: Payer: Self-pay | Admitting: *Deleted

## 2019-05-03 LAB — CBC WITH DIFFERENTIAL/PLATELET
Abs Immature Granulocytes: 0.96 10*3/uL — ABNORMAL HIGH (ref 0.00–0.07)
Basophils Absolute: 0.1 10*3/uL (ref 0.0–0.1)
Basophils Relative: 0 %
Eosinophils Absolute: 0.4 10*3/uL (ref 0.0–0.5)
Eosinophils Relative: 2 %
HCT: 31.7 % — ABNORMAL LOW (ref 39.0–52.0)
Hemoglobin: 9.8 g/dL — ABNORMAL LOW (ref 13.0–17.0)
Immature Granulocytes: 4 %
Lymphocytes Relative: 6 %
Lymphs Abs: 1.2 10*3/uL (ref 0.7–4.0)
MCH: 31.4 pg (ref 26.0–34.0)
MCHC: 30.9 g/dL (ref 30.0–36.0)
MCV: 101.6 fL — ABNORMAL HIGH (ref 80.0–100.0)
Monocytes Absolute: 1.4 10*3/uL — ABNORMAL HIGH (ref 0.1–1.0)
Monocytes Relative: 6 %
Neutro Abs: 18.3 10*3/uL — ABNORMAL HIGH (ref 1.7–7.7)
Neutrophils Relative %: 82 %
Platelets: 306 10*3/uL (ref 150–400)
RBC: 3.12 MIL/uL — ABNORMAL LOW (ref 4.22–5.81)
RDW: 20.1 % — ABNORMAL HIGH (ref 11.5–15.5)
WBC: 22.5 10*3/uL — ABNORMAL HIGH (ref 4.0–10.5)
nRBC: 0.2 % (ref 0.0–0.2)

## 2019-05-03 LAB — SARS CORONAVIRUS 2 (TAT 6-24 HRS): SARS Coronavirus 2: NEGATIVE

## 2019-05-03 LAB — GLUCOSE, CAPILLARY
Glucose-Capillary: 178 mg/dL — ABNORMAL HIGH (ref 70–99)
Glucose-Capillary: 228 mg/dL — ABNORMAL HIGH (ref 70–99)
Glucose-Capillary: 149 mg/dL — ABNORMAL HIGH (ref 70–99)
Glucose-Capillary: 298 mg/dL — ABNORMAL HIGH (ref 70–99)

## 2019-05-03 NOTE — Progress Notes (Signed)
PROGRESS NOTE  George [name] [id number] DOB: 05-07-1923 DOA: 04/22/2019 PCP: Merrilee Seashore, MD  Brief History   The patient is a 85 yr old man who presented to [facility] ED on 04/22/2019 with complaints of pain in his left hip after twisting it. He has a past medical history significant for atrial fibrillation, hyperlipidemia, DM II, chronic diastolic heart failure, chronically elevated WBC's, and prostate cancer. He was found to have a periprosthetic fracture of the left hip. Orthopedic surgery was consulted.   He was admitted to a telemetry bed. He underwent surgery for his left hip on 04/27/2019. He has tolerated the procedure well. The patient has had acute blood loss anemia for which he has received 3 units of PRBC's. He also has been treated for a UTI. Urine culture has grown out E. Faecalis.   He is awaiting transfer to SNF.  Consultants  . Orthopedic surgery  Procedures  . ORIF of periprosthetic fracture of left femur. . Transfusion of 3 units of PRBC's.  Antibiotics   Anti-infectives (From admission, onward)   Start     Dose/Rate Route Frequency Ordered Stop   04/28/19 1400  amoxicillin (AMOXIL) capsule 500 mg     500 mg Oral Every 8 hours 04/28/19 1110 04/30/19 2229   04/28/19 1000  vancomycin (VANCOREADY) IVPB 1250 mg/250 mL  Status:  Discontinued     1,250 mg 166.7 mL/hr over 90 Minutes Intravenous Every 48 hours 04/26/19 0909 04/26/19 1333   04/27/19 0600  ampicillin (OMNIPEN) 1 g in sodium chloride 0.9 % 100 mL IVPB  Status:  Discontinued     1 g 300 mL/hr over 20 Minutes Intravenous Every 8 hours 04/26/19 1336 04/28/19 1110   04/26/19 0915  vancomycin (VANCOREADY) IVPB 1500 mg/300 mL     1,500 mg 150 mL/hr over 120 Minutes Intravenous  Once 04/26/19 0909 04/26/19 1258   04/24/19 1200  ceFEPIme (MAXIPIME) 2 g in sodium chloride 0.9 % 100 mL IVPB  Status:  Discontinued     2 g 200 mL/hr over 30 Minutes Intravenous Every 24 hours 04/24/19 1028 04/26/19 0905      .  Marland Kitchen   Subjective  The patient is resting comfortably. No new complaints.  Objective   Vitals:  Vitals:   05/03/19 0439 05/03/19 1342  BP: (!) 150/70 129/61  Pulse: 79 80  Resp: 18 16  Temp: 98 F (36.7 C) 98 F (36.7 C)  SpO2: 97% 96%    Exam:  Constitutional:  . The patient is awake, alert, and oriented x 3. No acute distress. Respiratory:  . No increased work of breathing. . No wheezes, rales, or rhonchi . No tactile fremitus Cardiovascular:  . Regular rate and rhythm . No murmurs, ectopy, or gallups. . No lateral PMI. No thrills. Abdomen:  . Abdomen is soft, non-tender, non-distended . No hernias, masses, or organomegaly . Normoactive bowel sounds.  Musculoskeletal:  . No cyanosis, clubbing, or edema Skin:  . No rashes, lesions, ulcers . palpation of skin: no induration or nodules Neurologic:  . CN 2-12 intact . Sensation all 4 extremities intact Psychiatric:  . Mental status o Mood, affect appropriate o Orientation to person, place, time  . judgment and insight appear intact  I have personally reviewed the following:   Today's Data  . Vitals, CBC  Scheduled Meds: . apixaban  5 mg Oral Q12H  . diltiazem  30 mg Oral Q6H  . docusate sodium  100 mg Oral BID  . feeding supplement  1 Container Oral Q24H  . feeding supplement (ENSURE ENLIVE)  237 mL Oral BID BM  . insulin aspart  0-6 Units Subcutaneous TID WC  . multivitamin with minerals  1 tablet Oral Daily  . polyethylene glycol  17 g Oral BID  . rosuvastatin  10 mg Oral Daily   Continuous Infusions: . methocarbamol (ROBAXIN) IV 500 mg (04/27/19 1644)    Principal Problem:   Hip fracture ([place]) Active Problems:   Chronic atrial fibrillation (H[facility])   HLD (hyperlipidemia)   AKI (acute kidney injury) ([place])   LOS: 11 days    A & P   Left periprosthetic hip fracture s/p ORIF on 04/27/2019  Orthopedics on board  Pain management/DVT PPx as per orthopedics  PT/OT as per  orthopedics Awaiting SNF placement.  Leukocytosis Somewhat chronic, unknown etiology Afebrile Procalcitonin negative UA unremarkable, UC no growth Chest x-ray showed improved mild atelectasis at the left lung base, no acute cardiopulmonary disease otherwise Can be followed as an outpatient  Acute blood loss anemia Hemoglobin dropped to 6.6, s/p transfused 1 unit of packed red blood cell on 12/22 Patient also received 2 units of PRBC during surgery  Monitor closely post op Daily CBC stable with hemoglobin of 9.8 today.  UTI 2/2 Enterococcus faecalis  Currently afebrile, with leukocytosis Urine culture showed Enterococcus faecalis Completed amoxicillin for total of 5 days Daily CBC, will trend  Acute kidney injury Improving s/p IVF Monitor creatinine, electrolytes, and volume status Avoid nephrotoxic agents and hypotension.  Chronic atrial fibrillation Rate controlled Continue cardizem Echo showed EF of 65 to 70%, left ventricular diastolic function could not be evaluated Continue Eliquis  Diabetes mellitus type 2 Last A1c 6.5 SSI, Accu-Cheks, hypoglycemic protocol  Possible acute on chronic diastolic HF BNP [id number], some wheezing noted ECHO as above Chest x-ray showed increased vascular congestion, repeat showed improvement Give 1 dose of IV Lasix on 04/29/2019 Strict I's and O's, daily weight  HLD Continue crestor  History of prostate cancer Outpt follow up   Family Communication/Anticipated D/C date and plan/Code Status   DVT prophylaxis: Eliquis Code Status: DNR Family Communication: Plan discussed with the patient Disposition Plan: SNF when bed is available  Vashaun Osmon, DO [facility] Direct contact: see [email or web address]  7PM-7AM contact night coverage as above 05/03/2019, 6:33 PM  LOS: 11 days

## 2019-05-03 NOTE — NC FL2 (Signed)
[place] MEDICAID FL2 LEVEL OF CARE SCREENING TOOL     IDENTIFICATION  Patient Name: George [name] Birthdate: 1924/05/04 Sex: male Admission Date (Current Location): 04/22/2019  [facility] and [place] Number:  Herbalist and Address:  [facility],  [place] [address] [place]      Provider Number: [id number]  Attending Physician Name and Address:  Karie Kirks, DO  Relative Name and Phone Number:  Inez Catalina [phone]    Current Level of Care: Hospital Recommended Level of Care: [place] Prior Approval Number:    Date Approved/Denied:   PASRR Number: [id number]  Discharge Plan: SNF    Current Diagnoses: Patient Active Problem List   Diagnosis Date Noted  . AKI (acute kidney injury) ([place]) 04/25/2019  . Hip fracture ([place]) 04/22/2019  . Peri-prosthetic fracture around prosthetic hip   . Bilateral knee pain 02/24/2019  . Current use of long term anticoagulation 01/06/2019  . Non-occlusive coronary artery disease 01/06/2019  . Pure hypercholesterolemia 01/06/2019  . Primary osteoarthritis of both knees 06/17/2016  . Esophageal ring 12/14/2014  . Chronic atrial fibrillation ([place]) 12/14/2014  . Chest pain 12/14/2014  . HLD (hyperlipidemia) 12/14/2014  . History of GI bleed 12/14/2014  . Peripheral neuropathy w/gait disturbance 12/14/2014  . Pain in the chest   . Paroxysmal atrial fibrillation ([place])   . Gastroesophageal reflux disease without esophagitis     Orientation RESPIRATION BLADDER Height & Weight     Self, Place, Situation  Normal Incontinent, External catheter Weight: 182 lb 1.6 oz (82.6 kg) Height:  5\' 10"  (177.8 cm)  BEHAVIORAL SYMPTOMS/MOOD NEUROLOGICAL BOWEL NUTRITION STATUS      Continent Diet(dsy 3)  AMBULATORY STATUS COMMUNICATION OF NEEDS Skin   Extensive Assist Verbally                         Personal Care Assistance Level of Assistance  Bathing, Feeding, Dressing Bathing  Assistance: Maximum assistance Feeding assistance: Independent Dressing Assistance: Maximum assistance     Functional Limitations Info  Sight, Speech, Hearing Sight Info: Adequate Hearing Info: Adequate Speech Info: Adequate    SPECIAL CARE FACTORS FREQUENCY  PT (By licensed PT), OT (By licensed OT)     PT Frequency: 5x wk OT Frequency: 5x wk            Contractures Contractures Info: Not present    Additional Factors Info  Code Status, Allergies Code Status Info: dnr Allergies Info: aspirin           Current Medications (05/03/2019):  This is the current hospital active medication list Current Facility-Administered Medications  Medication Dose Route Frequency Provider Last Rate Last Admin  . acetaminophen (TYLENOL) tablet 325-650 mg  325-650 mg Oral Q6H PRN Edmisten, Kristie L, PA      . apixaban (ELIQUIS) tablet 5 mg  5 mg Oral Q12H Lenis Noon, RPH   5 mg at 05/03/19 [id number]  . bisacodyl (DULCOLAX) suppository 10 mg  10 mg Rectal Daily PRN Edmisten, Kristie L, PA      . diltiazem (CARDIZEM) tablet 30 mg  30 mg Oral Q6H Aluisio, Pilar Plate, MD   30 mg at 05/03/19 1301  . diphenhydrAMINE (BENADRYL) 12.5 MG/5ML elixir 12.5-25 mg  12.5-25 mg Oral Q4H PRN Edmisten, Kristie L, PA      . docusate sodium (COLACE) capsule 100 mg  100 mg Oral BID Edmisten, Kristie L, PA   100 mg at 05/03/19 0933  . feeding  supplement (BOOST / RESOURCE BREEZE) liquid 1 Container  1 Container Oral Q24H Gaynelle Arabian, MD   1 Container at 05/03/19 1542  . feeding supplement (ENSURE ENLIVE) (ENSURE ENLIVE) liquid 237 mL  237 mL Oral BID BM Gaynelle Arabian, MD   237 mL at 05/03/19 0920  . insulin aspart (novoLOG) injection 0-6 Units  0-6 Units Subcutaneous TID WC Alma Friendly, MD   2 Units at 05/03/19 1302  . menthol-cetylpyridinium (CEPACOL) lozenge 3 mg  1 lozenge Oral PRN Edmisten, Kristie L, PA       Or  . phenol (CHLORASEPTIC) mouth spray 1 spray  1 spray Mouth/Throat PRN Edmisten, Kristie  L, PA      . methocarbamol (ROBAXIN) tablet 500 mg  500 mg Oral Q6H PRN Edmisten, Kristie L, PA   500 mg at 05/03/19 1302   Or  . methocarbamol (ROBAXIN) 500 mg in dextrose 5 % 50 mL IVPB  500 mg Intravenous Q6H PRN Edmisten, Kristie L, PA 100 mL/hr at 04/27/19 1644 500 mg at 04/27/19 1644  . metoCLOPramide (REGLAN) tablet 5-10 mg  5-10 mg Oral Q8H PRN Edmisten, Kristie L, PA       Or  . metoCLOPramide (REGLAN) injection 5-10 mg  5-10 mg Intravenous Q8H PRN Edmisten, Kristie L, PA      . multivitamin with minerals tablet 1 tablet  1 tablet Oral Daily Gaynelle Arabian, MD   1 tablet at 05/03/19 0933  . nitroGLYCERIN (NITROSTAT) SL tablet 0.4 mg  0.4 mg Sublingual Q5 min PRN Aluisio, Pilar Plate, MD      . ondansetron (ZOFRAN) tablet 4 mg  4 mg Oral Q6H PRN Edmisten, Kristie L, PA       Or  . ondansetron (ZOFRAN) injection 4 mg  4 mg Intravenous Q6H PRN Edmisten, Kristie L, PA      . oxyCODONE (Oxy IR/ROXICODONE) immediate release tablet 10 mg  10 mg Oral Q4H PRN Edmisten, Kristie L, PA   10 mg at 05/03/19 1301  . oxyCODONE (Oxy IR/ROXICODONE) immediate release tablet 5 mg  5 mg Oral Q4H PRN Edmisten, Kristie L, PA   5 mg at 05/02/19 0921  . polyethylene glycol (MIRALAX / GLYCOLAX) packet 17 g  17 g Oral BID Alma Friendly, MD   17 g at 05/03/19 0932  . rosuvastatin (CRESTOR) tablet 10 mg  10 mg Oral Daily Gaynelle Arabian, MD   10 mg at 05/03/19 0933  . sodium phosphate (FLEET) 7-19 GM/118ML enema 1 enema  1 enema Rectal Once PRN Edmisten, Ok Anis, PA         Discharge Medications: Please see discharge summary for a list of discharge medications.  Relevant Imaging Results:  Relevant Lab Results:   Additional Information SS# [id number]  Wende Neighbors, LCSW

## 2019-05-03 NOTE — TOC Progression Note (Signed)
Transition of Care [facility]) - Progression Note    Patient Details  Name: George [name] MRN: [id number] Date of Birth: 11-21-1923  Transition of Care [facility]) CM/SW [place], LCSW Phone Number: 05/03/2019, 5:48 PM  Clinical Narrative:   CSW spoke with patients significant other Betty via phone. Inez Catalina was upset because patient could not understand why she was unable to visit. CSW listened to Betty's concerns and offered support during this time.  CSW stated that patient has bed offers but Inez Catalina requested that [place] contact her grandson and have him choice a facility. CSW spoke with Pi and he stated he would like some time to research facility prior to picking one    Expected Discharge Plan: [place] Barriers to Discharge: Continued Medical Work up  Expected Discharge Plan and Services Expected Discharge Plan: [place]   Discharge Planning Services: CM Consult   Living arrangements for the past 2 months: Single Family Home Expected Discharge Date: 04/28/19                                     Social Determinants of Health (SDOH) Interventions    Readmission Risk Interventions No flowsheet data found.

## 2019-05-03 NOTE — TOC Progression Note (Signed)
Transition of Care [facility]) - Progression Note    Patient Details  Name: George [name] MRN: [id number] Date of Birth: [birth year]  Transition of Care [facility]) CM/SW [place], LCSW Phone Number: 05/03/2019, 4:11 PM  Clinical Narrative:   Spoke with patients significant other via phone. Inez Catalina stated that patient lives alone and has his own place. Inez Catalina requested that CSW contact her Steele Sizer to go over discharge plans. CSW spoke with Pi, Pi stated that he is agreeable for patient to go to a rehab facility in the area.CSW and PI went over family's desire for patient to go to CIR. CSW explained that MD did not place a consult for CIR and physical therapy was recommending SNF placement. CSW will follow up with family for available bed offers.    Expected Discharge Plan: [place] Barriers to Discharge: Continued Medical Work up  Expected Discharge Plan and Services Expected Discharge Plan: [place]   Discharge Planning Services: CM Consult   Living arrangements for the past 2 months: Single Family Home Expected Discharge Date: 04/28/19                                     Social Determinants of Health (SDOH) Interventions    Readmission Risk Interventions No flowsheet data found.

## 2019-05-04 LAB — BASIC METABOLIC PANEL
Anion gap: 10 (ref 5–15)
BUN: 39 mg/dL — ABNORMAL HIGH (ref 8–23)
CO2: 27 mmol/L (ref 22–32)
Calcium: 8 mg/dL — ABNORMAL LOW (ref 8.9–10.3)
Chloride: 103 mmol/L (ref 98–111)
Creatinine, Ser: 1.35 mg/dL — ABNORMAL HIGH (ref 0.61–1.24)
GFR calc Af Amer: 52 mL/min — ABNORMAL LOW (ref >60)
GFR calc non Af Amer: 45 mL/min — ABNORMAL LOW (ref >60)
Glucose, Bld: 174 mg/dL — ABNORMAL HIGH (ref 70–99)
Potassium: 4.2 mmol/L (ref 3.5–5.1)
Sodium: 140 mmol/L (ref 135–145)

## 2019-05-04 LAB — CBC WITH DIFFERENTIAL/PLATELET
Abs Immature Granulocytes: 0.86 10*3/uL — ABNORMAL HIGH (ref 0.00–0.07)
Basophils Absolute: 0.1 10*3/uL (ref 0.0–0.1)
Basophils Relative: 0 %
Eosinophils Absolute: 0.3 10*3/uL (ref 0.0–0.5)
Eosinophils Relative: 1 %
HCT: 34.4 % — ABNORMAL LOW (ref 39.0–52.0)
Hemoglobin: 10.7 g/dL — ABNORMAL LOW (ref 13.0–17.0)
Immature Granulocytes: 3 %
Lymphocytes Relative: 5 %
Lymphs Abs: 1.3 10*3/uL (ref 0.7–4.0)
MCH: 32.1 pg (ref 26.0–34.0)
MCHC: 31.1 g/dL (ref 30.0–36.0)
MCV: 103.3 fL — ABNORMAL HIGH (ref 80.0–100.0)
Monocytes Absolute: 1.8 10*3/uL — ABNORMAL HIGH (ref 0.1–1.0)
Monocytes Relative: 7 %
Neutro Abs: 22.5 10*3/uL — ABNORMAL HIGH (ref 1.7–7.7)
Neutrophils Relative %: 84 %
Platelets: 318 10*3/uL (ref 150–400)
RBC: 3.33 MIL/uL — ABNORMAL LOW (ref 4.22–5.81)
RDW: 19.9 % — ABNORMAL HIGH (ref 11.5–15.5)
WBC: 26.9 10*3/uL — ABNORMAL HIGH (ref 4.0–10.5)
nRBC: 0.1 % (ref 0.0–0.2)

## 2019-05-04 LAB — GLUCOSE, CAPILLARY
Glucose-Capillary: 147 mg/dL — ABNORMAL HIGH (ref 70–99)
Glucose-Capillary: 287 mg/dL — ABNORMAL HIGH (ref 70–99)
Glucose-Capillary: 292 mg/dL — ABNORMAL HIGH (ref 70–99)

## 2019-05-04 MED ORDER — ADULT MULTIVITAMIN W/MINERALS CH: 1.0000 | ORAL_TABLET | Freq: Every day | ORAL | 0 refills | Status: AC

## 2019-05-04 MED ORDER — DILTIAZEM HCL ER COATED BEADS 120 MG PO CP24: 120.0000 mg | ORAL_CAPSULE | Freq: Every day | ORAL | 11 refills | Status: AC

## 2019-05-04 MED ORDER — ENSURE ENLIVE PO LIQD: 237.0000 mL | Freq: Two times a day (BID) | ORAL | 12 refills | Status: AC

## 2019-05-04 MED ORDER — DOCUSATE SODIUM 100 MG PO CAPS: 100.0000 mg | ORAL_CAPSULE | Freq: Two times a day (BID) | ORAL | 0 refills | Status: AC

## 2019-05-04 MED ORDER — POLYETHYLENE GLYCOL 3350 17 G PO PACK: 17.0000 g | PACK | Freq: Two times a day (BID) | ORAL | 0 refills | Status: AC

## 2019-05-04 NOTE — TOC Transition Note (Signed)
Transition of Care [facility]) - CM/SW Discharge Note   Patient Details  Name: George [name] MRN: [id number] Date of Birth: 11-27-1923  Transition of Care [facility]) CM/SW Contact:  Wende Neighbors, LCSW Phone Number: 05/04/2019, 2:08 PM   Clinical Narrative:   Patient to go to [facility] via [place]. Rn to call [phone] (rm# [id number]) for report.     Final next level of care: Skilled Nursing Facility Barriers to Discharge: No Barriers Identified   Patient Goals and CMS Choice Patient states their goals for this hospitalization and ongoing recovery are:: to get better      Discharge Placement              Patient chooses bed at: [facility] [place] Patient to be transferred to facility by: [place] Name of family member notified: Pi (grandson) Patient and family notified of of transfer: 05/04/19  Discharge Plan and Services   Discharge Planning Services: CM Consult                                 Social Determinants of Health (SDOH) Interventions     Readmission Risk Interventions No flowsheet data found.

## 2019-05-04 NOTE — Progress Notes (Signed)
Nutrition Follow-up  DOCUMENTATION CODES:   Not applicable  INTERVENTION:  - continue Boost Breeze once/day and Ensure Enlive BID.   NUTRITION DIAGNOSIS:   Increased nutrient needs related to acute illness, post-op healing as evidenced by estimated needs. -ongoing  GOAL:   Patient will meet greater than or equal to 90% of their needs -met on average  MONITOR:   PO intake, Supplement acceptance, Labs, Weight trends  ASSESSMENT:   85 y.o. male with history of A. fib, hyperlipidemia, and prostate cancer. Patient presented to the ED with L hip pain after twisting it when he missed a step while walking to his garage. He did not fall.  Patient has been accepting Boost Breeze and Ensure 75-90% of the time offered. Recently he has mainly been eating 50-100% of meals.   Discharge order and discharge summary entered earlier today for patient to d/c to SNF.      Labs reviewed; CBGs: 147 and 287 mg/dl, BUN: 39 mg/dl, creatinine: 1.35 mg/dl, Ca: 8 mg/dl, GFR: 45 ml/min. Medications reviewed; 100 mg colace BID, sliding scale novolog, daily multivitamin with minerals, 1 packet miralax BID.    Diet Order:   Diet Order            Diet Carb Modified        DIET DYS 3 Room service appropriate? Yes; Fluid consistency: Thin  Diet effective now        Diet - low sodium heart healthy              EDUCATION NEEDS:   No education needs have been identified at this time  Skin:  Skin Assessment: Skin Integrity Issues: Skin Integrity Issues:: Incisions Incisions: L hip (12/23)  Last BM:  12/30  Height:   Ht Readings from Last 1 Encounters:  04/27/19 '5\' 10"'$  (1.778 m)    Weight:   Wt Readings from Last 1 Encounters:  05/04/19 86.4 kg    Ideal Body Weight:  75.4 kg  BMI:  Body mass index is 27.33 kg/m.  Estimated Nutritional Needs:   Kcal:  1800-2000 kcal  Protein:  90-100 grams  Fluid:  >/= 2 L/day      Jarome Matin, MS, RD, LDN, [facility] Inpatient Clinical  Dietitian Pager # [phone] After hours/weekend pager # [phone]

## 2019-05-04 NOTE — Discharge Summary (Addendum)
Physician Discharge Summary  George [name] [id number] DOB: March 10, 1924 DOA: 04/22/2019  PCP: Merrilee Seashore, MD  Admit date: 04/22/2019 Discharge date: 05/04/2019  Recommendations for Outpatient Follow-up:  Discharge to SNF with PT/OT WBAT on left lower extremity Follow up with orthopedic surgery as directed. Make appointment for 05/12/19. Follow up with PCP in 7-10 days after discharge from SNF.  Follow-up Information     Gaynelle Arabian, MD. Schedule an appointment as soon as possible for a visit on 05/12/2019.   Specialty: Orthopedic Surgery Contact information: [address] [place] [address] [place] [address] [phone]           Discharge Diagnoses: Principal diagnosis is #1 Left hip fracture Chronic lymphocytosis Acute blood loss anemia in the setting of anemia of kidney disease E. Faecalis UTI AKI  Chronic atrial fibrillation DM II Acute on chronic diastolic heart failure Sepsis - resolved.  Discharge Condition: Fair  Disposition: SNF  Diet recommendation: Heart healthy/carbohydrate controlled  Filed Weights   05/02/19 0406 05/03/19 0438 05/04/19 0500  Weight: 86.8 kg 82.6 kg 86.4 kg    History of present illness: George [name] is a 85 y.o. male with history of A. fib, hyperlipidemia and prostate cancer and observation presents to the ER after patient started having left hip pain after he twisted it while trying to walk towards his garage at his home.  Patient states this happened after he missed a step in his house.  Did not fall or hit his head did not have any chest pain or shortness of breath.  Patient states last couple of days he has not taken his apixaban.   ED Course: X-rays in the ER showed left periprosthetic femur fracture for which Dr. Gladstone Lighter orthopedic surgeon was consulted.  Labs show leukocytosis with 24 and blood glucose of 174.  EKG shows A. fib rate controlled.  Covid test was negative.  Patient admitted for further  management of left periprosthetic fracture.  Hospital Course: The patient is a 85 yr old man who presented to [facility] on 04/22/2019 with complaints of pain in his left hip after twisting it. He has a past medical history significant for atrial fibrillation, hyperlipidemia, DM II, chronic diastolic heart failure, chronically elevated WBC's, and prostate cancer. He was found to have a periprosthetic fracture of the left hip. Orthopedic surgery was consulted.    He was admitted to a telemetry bed. He underwent surgery for his left hip on 04/27/2019. He has tolerated the procedure well. The patient has had acute blood loss anemia for which he has received 3 units of PRBC's. He also has been treated for a UTI. Urine culture has grown out E. Faecalis.   The patient will be discharged to SNF today for rehab.  Today's assessment: S: The patient is resting comfortably. No new complaints. O: Vitals:  Vitals:   05/04/19 0500 05/04/19 1318  BP: 138/75 129/69  Pulse: 84 98  Resp: 16 16  Temp: 97.6 F (36.4 C) 98.3 F (36.8 C)  SpO2: 96% 98%   Constitutional:  The patient is awake, alert, and oriented x 3. No acute distress. Respiratory:  No increased work of breathing. No wheezes, rales, or rhonchi No tactile fremitus Cardiovascular:  Regular rate and rhythm No murmurs, ectopy, or gallups. No lateral PMI. No thrills. Abdomen:  Abdomen is soft, non-tender, non-distended No hernias, masses, or organomegaly Normoactive bowel sounds.  Musculoskeletal:  No cyanosis, clubbing, or edema Skin:  No rashes, lesions, ulcers palpation of skin: no induration  or nodules Neurologic:  CN 2-12 intact Sensation all 4 extremities intact Psychiatric:  Mental status Mood, affect appropriate Orientation to person, place, time  judgment and insight appear intact Discharge Instructions  Discharge Instructions     Call MD / Call 911   Complete by: As directed    If you experience chest pain or  shortness of breath, CALL 911 and be transported to the hospital emergency room.  If you develope a fever above 101 F, pus (white drainage) or increased drainage or redness at the wound, or calf pain, call your surgeon's office.   Call MD for:  redness, tenderness, or signs of infection (pain, swelling, redness, odor or green/yellow discharge around incision site)   Complete by: As directed    Call MD for:  severe uncontrolled pain   Complete by: As directed    Constipation Prevention   Complete by: As directed    Drink plenty of fluids.  Prune juice may be helpful.  You may use a stool softener, such as Colace (over the counter) 100 mg twice a day.  Use MiraLax (over the counter) for constipation as needed.   DO NOT drive, shower or take a tub bath until instructed by your physician   Complete by: As directed    Diet - low sodium heart healthy   Complete by: As directed    Diet Carb Modified   Complete by: As directed    Discharge instructions   Complete by: As directed    Discharge to SNF for rehab Follow up with PCP in 7-10 days after discharge from SNF WBAT on Left lower extremity. Follow up with orthopedic surgery as directed.   Discharge wound care:   Complete by: As directed    You may change the dressing on Friday. Cover the incision with 4x4 gauze and paper tape, change this dressing daily until your follow-up appointment.   Do not sit on low chairs, stoools or toilet seats, as it may be difficult to get up from low surfaces   Complete by: As directed    Increase activity slowly   Complete by: As directed    Partial weight bearing   Complete by: As directed    % Body Weight: 25   Laterality: left      Allergies as of 05/04/2019       Reactions   Aspirin Other (See Comments)   bleeding        Medication List     STOP taking these medications    diazepam 2 MG tablet Commonly known as: VALIUM   fluorouracil 5 % cream Commonly known as: EFUDEX     HYDROcodone-acetaminophen 5-325 MG tablet Commonly known as: NORCO/VICODIN   NONFORMULARY OR COMPOUNDED ITEM       TAKE these medications    acetaminophen 650 MG CR tablet Commonly known as: TYLENOL Take 650 mg by mouth daily as needed for pain.   apixaban 5 MG Tabs tablet Commonly known as: Eliquis Take 1 tablet (5 mg total) by mouth 2 (two) times daily. What changed: when to take this   diltiazem 120 MG 24 hr capsule Commonly known as: Cardizem CD Take 1 capsule (120 mg total) by mouth daily.   docusate sodium 100 MG capsule Commonly known as: COLACE Take 1 capsule (100 mg total) by mouth 2 (two) times daily.   feeding supplement (ENSURE ENLIVE) Liqd Take 237 mLs by mouth 2 (two) times daily between meals.   methocarbamol 500 MG tablet Commonly  known as: ROBAXIN Take 1 tablet (500 mg total) by mouth every 6 (six) hours as needed for muscle spasms.   multivitamin with minerals Tabs tablet Take 1 tablet by mouth daily. Start taking on: May 05, 2019   nitroGLYCERIN 0.4 MG SL tablet Commonly known as: NITROSTAT Place 1 tablet (0.4 mg total) under the tongue every 5 (five) minutes as needed for chest pain.   oxyCODONE 5 MG immediate release tablet Commonly known as: Oxy IR/ROXICODONE Take 1-2 tablets (5-10 mg total) by mouth every 6 (six) hours as needed for moderate pain or severe pain (pain score 4-6).   pantoprazole 40 MG tablet Commonly known as: PROTONIX Take 1 tablet (40 mg total) by mouth daily.   polyethylene glycol 17 g packet Commonly known as: MIRALAX / GLYCOLAX Take 17 g by mouth 2 (two) times daily.   rosuvastatin 10 MG tablet Commonly known as: CRESTOR Take 10 mg by mouth daily.               Discharge Care Instructions  (From admission, onward)           Start     Ordered   04/28/19 0000  Partial weight bearing    Question Answer Comment  % Body Weight 25   Laterality left      04/28/19 [id number]   04/28/19 0000  Discharge  wound care:    Comments: You may change the dressing on Friday. Cover the incision with 4x4 gauze and paper tape, change this dressing daily until your follow-up appointment.   04/28/19 [id number]           Allergies  Allergen Reactions   Aspirin Other (See Comments)    bleeding    The results of significant diagnostics from this hospitalization (including imaging, microbiology, ancillary and laboratory) are listed below for reference.    Significant Diagnostic Studies: CT ABDOMEN PELVIS WO CONTRAST  Result Date: 04/26/2019 CLINICAL DATA:  Fall, left hip pain. Anemia. Evaluate for retroperitoneal bleed. EXAM: CT ABDOMEN AND PELVIS WITHOUT CONTRAST TECHNIQUE: Multidetector CT imaging of the abdomen and pelvis was performed following the standard protocol without IV contrast. COMPARISON:  12/09/2009 FINDINGS: Lower chest: Coronary artery calcifications. Heart is normal size. Bibasilar dependent atelectasis. Hepatobiliary: Calcifications in the liver compatible with old granulomatous disease. Prior cholecystectomy. No focal hepatic abnormality. Pancreas: Pancreatic atrophy. No focal abnormality or ductal dilatation. Spleen: No focal abnormality.  Normal size. Adrenals/Urinary Tract: Large cysts in the upper pole of the right kidney are unchanged since prior study. No hydronephrosis. No adrenal or urinary bladder visible abnormality. Stomach/Bowel: Sigmoid diverticulosis. Colonic diverticulosis, most notable in the sigmoid colon and right colon. No active diverticulitis. Stomach and small bowel decompressed. Vascular/Lymphatic: Aortic atherosclerosis. No enlarged abdominal or pelvic lymph nodes. Reproductive: Obscured by beam hardening artifact from bilateral hip replacements. Other: No free fluid or free air.  No retroperitoneal hematoma. Musculoskeletal: Bilateral hip replacements. No visible fracture. Degenerative changes in the lumbar spine. There is stranding within the subcutaneous soft tissues of  the left hip and proximal thigh compatible with hematoma. IMPRESSION: No retroperitoneal hematoma. Stranding within the subcutaneous soft tissues overlying the left hip compatible with superficial hematoma. No visible bony abnormality. Colonic diverticulosis. Aortic atherosclerosis. Electronically Signed   By: Rolm Baptise M.D.   On: 04/26/2019 08:27   DG Chest 1 View  Result Date: 04/22/2019 CLINICAL DATA:  Pain status post fall EXAM: CHEST  1 VIEW COMPARISON:  12/14/2014 FINDINGS: The lung volumes are low. Bibasilar atelectasis is  noted, greatest at the left lung base. The heart size is enlarged. There is no pneumothorax or large pleural effusion. There is no obvious acute osseous abnormality. There are advanced degenerative changes of both glenohumeral joints. There is mild height loss of several lower thoracic vertebral bodies which appears stable from prior study. IMPRESSION: 1. Low lung volumes with bibasilar atelectasis, greatest at the left lung base. 2. Cardiomegaly. 3. Advanced degenerative changes of both glenohumeral joints. Electronically Signed   By: Constance Holster M.D.   On: 04/22/2019 20:27   US Renal  Result Date: 04/24/2019 CLINICAL DATA:  85 year old male with acute kidney injury. EXAM: RENAL / URINARY TRACT ULTRASOUND COMPLETE COMPARISON:  12/09/2009 ultrasound and CT FINDINGS: Right Kidney: Renal measurements: 10.1 x 5 x 4.3 cm = volume: 112 mL. UPPER limits normal renal echogenicity noted. Two large renal cysts are noted, measuring 5.8 cm and 6.4 cm respectively. No solid mass or hydronephrosis. Mild cortical atrophy noted. Left Kidney: Renal measurements: 10.5 x 5.2 x 5.1 cm = volume: 145 mL. UPPER limits normal renal echogenicity noted with mild cortical atrophy. No solid or cystic mass or hydronephrosis noted. Bladder: Appears normal for degree of bladder distention. Other: None. IMPRESSION: 1. UPPER limits of normal renal echogenicity with mild bilateral renal cortical  atrophy. 2. No evidence of hydronephrosis. 3. RIGHT renal cysts. Electronically Signed   By: Margarette Canada M.D.   On: 04/24/2019 12:30   DG CHEST PORT 1 VIEW  Result Date: 04/29/2019 CLINICAL DATA:  85 year old who is postop day 2 ORIF LEFT femur fracture, now with leukocytosis. EXAM: PORTABLE CHEST 1 VIEW COMPARISON:  04/27/2019 and earlier. FINDINGS: Suboptimal inspiration. Cardiac silhouette upper normal in size to slightly enlarged for AP portable technique, unchanged. Mild atelectasis at the LEFT lung base, improved since the examination 2 days ago. Resolution of the RIGHT basilar atelectasis since that examination. No new pulmonary parenchymal abnormalities. Pulmonary vascularity normal without evidence of pulmonary edema. IMPRESSION: 1. Mild atelectasis at the LEFT lung base, improved since the examination 2 days ago. 2. No acute cardiopulmonary disease otherwise. Electronically Signed   By: Evangeline Dakin M.D.   On: 04/29/2019 14:57   DG Chest Port 1 View  Result Date: 04/27/2019 CLINICAL DATA:  Shortness of breath post femur fracture fixation/ORIF. EXAM: PORTABLE CHEST 1 VIEW COMPARISON:  Radiographs 04/24/2019 and 04/22/2019. FINDINGS: 1718 hours. The heart size and mediastinal contours are stable. There is increased vascular congestion without overt pulmonary edema. Asymmetric left basilar atelectasis appears slightly worse. There is no pneumothorax or significant pleural effusion. The bones appear unchanged. IMPRESSION: Increased vascular congestion without overt pulmonary edema. Mildly increased left basilar atelectasis. Electronically Signed   By: Richardean Sale M.D.   On: 04/27/2019 17:44   DG CHEST PORT 1 VIEW  Result Date: 04/24/2019 CLINICAL DATA:  Leukocytosis EXAM: PORTABLE CHEST 1 VIEW COMPARISON:  04/22/2019 and prior radiographs FINDINGS: Cardiomegaly size and minimal LEFT basilar atelectasis/scarring again noted. There is no evidence of focal airspace disease, pulmonary edema,  suspicious pulmonary nodule/mass, pleural effusion, or pneumothorax. No acute bony abnormalities are identified. IMPRESSION: 1. No evidence of acute cardiopulmonary disease. 2. Unchanged minimal LEFT basilar atelectasis/scarring. Electronically Signed   By: Margarette Canada M.D.   On: 04/24/2019 12:28   DG Abd Portable 1V  Result Date: 04/30/2019 CLINICAL DATA:  Constipation and abdominal pain. EXAM: PORTABLE ABDOMEN - 1 VIEW COMPARISON:  CT of the abdomen and pelvis on 04/26/2019 FINDINGS: No evidence of overt bowel obstruction. Mild gas in nondilated  small bowel may be consistent with mild ileus. There is fecal material in the colon which does not appear dilated. No gross signs of free air. No abnormal calcifications. Clips are seen related to prior cholecystectomy. Degenerative disease of the lumbar spine. IMPRESSION: Mild gas in nondilated small bowel may be consistent with mild ileus. Electronically Signed   By: Aletta Edouard M.D.   On: 04/30/2019 08:44   ECHOCARDIOGRAM COMPLETE  Result Date: 04/28/2019   ECHOCARDIOGRAM REPORT   Patient Name:   George [name] Date of Exam: 04/28/2019 Medical Rec #:  [id number]        Height:       70.0 in Accession #:    [id number]       Weight:       175.0 lb Date of Birth:  1923-09-14        BSA:          1.97 m Patient Age:    77 years         BP:           137/67 mmHg Patient Gender: M                HR:           84 bpm. Exam Location:  Inpatient Procedure: 2D Echo Indications:    dyspnea 786.09  History:        Patient has prior history of Echocardiogram examinations, most                 recent 12/15/2014. Arrythmias:Atrial Fibrillation; Risk                 Factors:Dyslipidemia.  Sonographer:    Jannett Celestine RDCS (AE) Referring Phys: [id number] [place]  Sonographer Comments: Technically difficult study due to poor echo windows, suboptimal parasternal window, suboptimal apical window and suboptimal subcostal window. limited mobility due to leg injury  IMPRESSIONS  1. Left ventricular ejection fraction, by visual estimation, is 65 to 70%. The left ventricle has hyperdynamic function. There is mildly increased left ventricular hypertrophy.  2. Left ventricular diastolic function could not be evaluated.  3. The left ventricle has no regional wall motion abnormalities.  4. Global right ventricle has normal systolic function.The right ventricular size is normal. No increase in right ventricular wall thickness.  5. Left atrial size was mildly dilated.  6. Right atrial size was normal.  7. The mitral valve is normal in structure. No evidence of mitral valve regurgitation. No evidence of mitral stenosis.  8. The tricuspid valve is grossly normal.  9. The aortic valve is normal in structure. Aortic valve regurgitation is not visualized. No evidence of aortic valve sclerosis or stenosis. 10. The pulmonic valve was not well visualized. Pulmonic valve regurgitation is not visualized. 11. TR signal is inadequate for assessing pulmonary artery systolic pressure. 12. The inferior vena cava is normal in size with greater than 50% respiratory variability, suggesting right atrial pressure of 3 mmHg. FINDINGS  Left Ventricle: Left ventricular ejection fraction, by visual estimation, is 65 to 70%. The left ventricle has hyperdynamic function. The left ventricle has no regional wall motion abnormalities. There is mildly increased left ventricular hypertrophy. Concentric left ventricular hypertrophy. The left ventricular diastology could not be evaluated due to atrial fibrillation. Left ventricular diastolic function could not be evaluated. Normal left atrial pressure. Right Ventricle: The right ventricular size is normal. No increase in right ventricular wall thickness. Global RV systolic function is has normal systolic  function. Left Atrium: Left atrial size was mildly dilated. Right Atrium: Right atrial size was normal in size Pericardium: There is no evidence of pericardial  effusion. Mitral Valve: The mitral valve is normal in structure. No evidence of mitral valve regurgitation. No evidence of mitral valve stenosis by observation. Tricuspid Valve: The tricuspid valve is grossly normal. Tricuspid valve regurgitation is not demonstrated. Aortic Valve: The aortic valve is normal in structure. Aortic valve regurgitation is not visualized. The aortic valve is structurally normal, with no evidence of sclerosis or stenosis. Pulmonic Valve: The pulmonic valve was not well visualized. Pulmonic valve regurgitation is not visualized. Pulmonic regurgitation is not visualized. Aorta: The aortic root, ascending aorta and aortic arch are all structurally normal, with no evidence of dilitation or obstruction. Venous: The inferior vena cava is normal in size with greater than 50% respiratory variability, suggesting right atrial pressure of 3 mmHg. IAS/Shunts: No atrial level shunt detected by color flow Doppler. There is no evidence of a patent foramen ovale. No ventricular septal defect is seen or detected. There is no evidence of an atrial septal defect.  LEFT VENTRICLE PLAX 2D LVIDd:         2.20 cm LVIDs:         1.90 cm LV PW:         1.30 cm LV IVS:        1.30 cm LVOT diam:     2.20 cm LV SV:         5 ml LV SV Index:   2.53 LVOT Area:     3.80 cm  RIGHT VENTRICLE TAPSE (M-mode): 1.5 cm LEFT ATRIUM             Index       RIGHT ATRIUM           Index LA diam:        4.20 cm 2.13 cm/m  RA Area:     15.20 cm LA Vol (A2C):   45.3 ml 22.97 ml/m RA Volume:   40.30 ml  20.43 ml/m LA Vol (A4C):   64.9 ml 32.90 ml/m LA Biplane Vol: 59.1 ml 29.96 ml/m  AORTIC VALVE LVOT Vmax:   92.80 cm/s LVOT Vmean:  61.400 cm/s LVOT VTI:    0.165 m  AORTA Ao Root diam: 3.60 cm MITRAL VALVE MV Area (PHT): 4.36 cm             SHUNTS MV PHT:        50.46 msec           Systemic VTI:  0.16 m MV Decel Time: 174 msec             Systemic Diam: 2.20 cm MV E velocity: 124.00 cm/s 103 cm/s  Dani Gobble Croitoru MD  Electronically signed by Sanda Klein MD Signature Date/Time: 04/28/2019/3:34:26 PM    Final    DG Hip Unilat With Pelvis 2-3 Views Left  Result Date: 04/22/2019 CLINICAL DATA:  Pain status post fall EXAM: DG HIP (WITH OR WITHOUT PELVIS) 2-3V LEFT COMPARISON:  November 06, 2017 FINDINGS: The patient is status post total hip arthroplasty bilaterally. The hardware appears grossly intact. There is diffuse osteopenia. Again noted is a significantly displaced periprosthetic fracture involving the left femoral diaphysis. IMPRESSION: 1. Significantly displaced periprosthetic fracture involving the left femoral diaphysis. 2. Diffuse osteopenia. 3. Status post bilateral total hip arthroplasty. Electronically Signed   By: Constance Holster M.D.   On: 04/22/2019 20:29   DG Femur Portable  1 View Left  Result Date: 04/22/2019 CLINICAL DATA:  Femur fracture, postreduction. EXAM: LEFT FEMUR PORTABLE 1 VIEW COMPARISON:  Radiograph earlier this day. FINDINGS: Femoral shaft fracture in slightly improved alignment after immobilizer placement. Persistent osseous overriding of 5.8 cm. Persistent apex medial angulation. IMPRESSION: Slight improvement in alignment of the femoral shaft fracture after immobilizer placement. Persistent osseous overriding of 5.8 cm. Electronically Signed   By: Keith Rake M.D.   On: 04/22/2019 22:02   DG FEMUR MIN 2 VIEWS LEFT  Result Date: 04/22/2019 CLINICAL DATA:  Pain status post fall EXAM: LEFT FEMUR 2 VIEWS COMPARISON:  November 06, 2017 FINDINGS: There is an acute significantly displaced periprosthetic fracture involving the left femoral diaphysis. There is still overlap of the fracture fragments of nearly 9 cm. There is surrounding soft tissue swelling. The patient is status post total hip arthroplasty on the left. There is osteopenia. There are advanced degenerative changes of the left knee. Vascular calcifications are noted. IMPRESSION: 1. Acute significantly displaced periprosthetic  fracture of the left femoral diaphysis. 2. Osteopenia. 3. Advanced degenerative changes of the left knee. Electronically Signed   By: Constance Holster M.D.   On: 04/22/2019 20:28    Microbiology: Recent Results (from the past 240 hour(s))  Expectorated sputum assessment w rflx to resp cult     Status: None   Collection Time: 04/25/19  6:38 PM   Specimen: Sputum  Result Value Ref Range Status   Specimen Description SPUTUM  Final   Special Requests NONE  Final   Sputum evaluation   Final    THIS SPECIMEN IS ACCEPTABLE FOR SPUTUM CULTURE Performed at [facility], [place] [address], [place], [place] [address]    Report Status 04/25/2019 FINAL  Final  Culture, respiratory     Status: None   Collection Time: 04/25/19  6:38 PM   Specimen: SPU  Result Value Ref Range Status   Specimen Description   Final    SPUTUM Performed at [place] [address], [place], [place] [address]    Special Requests   Final    NONE Reflexed from [id number] Performed at [place] [address], [place], [place] [address]    Gram Stain   Final    FEW WBC PRESENT, PREDOMINANTLY PMN MODERATE GRAM NEGATIVE RODS MODERATE GRAM POSITIVE COCCI IN CLUSTERS    Culture   Final    Consistent with normal respiratory flora. Performed at [facility], [place] [address] [place], [place] [address]    Report Status 04/27/2019 FINAL  Final  Culture, Urine     Status: None   Collection Time: 04/30/19  1:34 PM   Specimen: Urine, Clean Catch  Result Value Ref Range Status   Specimen Description   Final    URINE, CLEAN CATCH Performed at [facility], [place] [address], [place], [place] [address]    Special Requests   Final    NONE Performed at [facility], [place] [address] [place], [place] [address]    Culture   Final    NO GROWTH Performed at [facility], [place] [address], [place], [place] [address]     Report Status 05/01/2019 FINAL  Final  SARS CORONAVIRUS 2 (TAT 6-24 HRS) Nasopharyngeal Nasopharyngeal Swab     Status: None   Collection Time: 05/03/19  5:55 PM   Specimen: Nasopharyngeal Swab  Result Value Ref Range Status   SARS Coronavirus 2 NEGATIVE NEGATIVE  Final    Comment: (NOTE) SARS-CoV-2 target nucleic acids are NOT DETECTED. The SARS-CoV-2 RNA is generally detectable in upper and lower respiratory specimens during the acute phase of infection. Negative results do not preclude SARS-CoV-2 infection, do not rule out co-infections with other pathogens, and should not be used as the sole basis for treatment or other patient management decisions. Negative results must be combined with clinical observations, patient history, and epidemiological information. The expected result is Negative. Fact Sheet for Patients: SugarRoll.be Fact Sheet for Healthcare Providers: https://www.woods-mathews.com/ This test is not yet approved or cleared by the [place] FDA and  has been authorized for detection and/or diagnosis of SARS-CoV-2 by FDA under an Emergency Use Authorization (EUA). This EUA will remain  in effect (meaning this test can be used) for the duration of the COVID-19 declaration under Section 56 4(b)(1) of the Act, 21 U.S.C. section 360bbb-3(b)(1), unless the authorization is terminated or revoked sooner. Performed at [facility], [place] [address] [place], [place] [address]      Labs: Basic Metabolic Panel: Recent Labs  Lab 04/29/19 0534 04/30/19 0603 05/01/19 0546 05/02/19 0452 05/04/19 0520  NA 140 142 141 140 140  K 4.6 4.4 4.3 4.6 4.2  CL 109 104 99 105 103  CO2 22 27 27 28 27   GLUCOSE 249* 206* 177* 225* 174*  BUN 47* 53* 47* 43* 39*  CREATININE 1.51* 1.61* 1.35* 1.30* 1.35*  CALCIUM 8.3* 8.4* 8.0* 8.1* 8.0*   Liver Function Tests: No results for input(s): AST, ALT, ALKPHOS, BILITOT, PROT, ALBUMIN in the  last 168 hours. No results for input(s): LIPASE, AMYLASE in the last 168 hours. No results for input(s): AMMONIA in the last 168 hours. CBC: Recent Labs  Lab 04/30/19 0603 05/01/19 0546 05/02/19 0452 05/03/19 0531 05/04/19 0520  WBC 24.4* 21.6* 19.3* 22.5* 26.9*  NEUTROABS 19.8* 16.7* 15.7* 18.3* 22.5*  HGB 9.6* 10.7* 10.2* 9.8* 10.7*  HCT 30.2* 33.9* 33.1* 31.7* 34.4*  MCV 99.7 100.9* 100.9* 101.6* 103.3*  PLT 227 280 302 306 318   Cardiac Enzymes: No results for input(s): CKTOTAL, CKMB, CKMBINDEX, TROPONINI in the last 168 hours. BNP: BNP (last 3 results) Recent Labs    04/28/19 0444  BNP 306.6*    ProBNP (last 3 results) No results for input(s): PROBNP in the last 8760 hours.  CBG: Recent Labs  Lab 05/03/19 1215 05/03/19 1715 05/03/19 2114 05/04/19 0719 05/04/19 1206  GLUCAP 228* 149* 298* 147* 287*    Principal Problem:   Hip fracture (HCC) Active Problems:   Chronic atrial fibrillation (HCC)   HLD (hyperlipidemia)   AKI (acute kidney injury) ([place])   Time coordinating discharge: 38 minutes.  Signed:        Danniel Tones, DO [facility]  05/04/2019, 1:26 PM

## 2019-05-04 NOTE — Care Management Important Message (Signed)
Important Message  Patient Details IM Letter given to [place] Case Manager to present to the Patient Name: George [name] MRN: [id number] Date of Birth: April 19, 1924   Medicare Important Message Given:  Yes     Kerin Salen 05/04/2019, 1:02 PM

## 2019-05-04 NOTE — Progress Notes (Signed)
Patient discharged to SNF via [facility]. All belongings w/ patient. Report called. Contact notified.

## 2019-05-06 DIAGNOSIS — G629 Polyneuropathy, unspecified: Secondary | ICD-10-CM | POA: Diagnosis not present

## 2019-05-06 DIAGNOSIS — N179 Acute kidney failure, unspecified: Secondary | ICD-10-CM | POA: Diagnosis not present

## 2019-05-06 DIAGNOSIS — I5033 Acute on chronic diastolic (congestive) heart failure: Secondary | ICD-10-CM | POA: Diagnosis not present

## 2019-05-06 DIAGNOSIS — I482 Chronic atrial fibrillation, unspecified: Secondary | ICD-10-CM | POA: Diagnosis not present

## 2019-05-06 DIAGNOSIS — D72820 Lymphocytosis (symptomatic): Secondary | ICD-10-CM | POA: Diagnosis not present

## 2019-05-06 DIAGNOSIS — K5900 Constipation, unspecified: Secondary | ICD-10-CM | POA: Diagnosis not present

## 2019-05-06 DIAGNOSIS — K449 Diaphragmatic hernia without obstruction or gangrene: Secondary | ICD-10-CM | POA: Diagnosis not present

## 2019-05-06 DIAGNOSIS — R079 Chest pain, unspecified: Secondary | ICD-10-CM | POA: Diagnosis not present

## 2019-05-06 DIAGNOSIS — M978XXD Periprosthetic fracture around other internal prosthetic joint, subsequent encounter: Secondary | ICD-10-CM | POA: Diagnosis not present

## 2019-05-06 DIAGNOSIS — I10 Essential (primary) hypertension: Secondary | ICD-10-CM | POA: Diagnosis not present

## 2019-05-06 DIAGNOSIS — N390 Urinary tract infection, site not specified: Secondary | ICD-10-CM | POA: Diagnosis not present

## 2019-05-06 DIAGNOSIS — Z96642 Presence of left artificial hip joint: Secondary | ICD-10-CM | POA: Diagnosis not present

## 2019-05-06 DIAGNOSIS — Z7901 Long term (current) use of anticoagulants: Secondary | ICD-10-CM | POA: Diagnosis not present

## 2019-05-06 DIAGNOSIS — S7292XD Unspecified fracture of left femur, subsequent encounter for closed fracture with routine healing: Secondary | ICD-10-CM | POA: Diagnosis not present

## 2019-05-06 DIAGNOSIS — M25552 Pain in left hip: Secondary | ICD-10-CM | POA: Diagnosis not present

## 2019-05-06 DIAGNOSIS — M62838 Other muscle spasm: Secondary | ICD-10-CM | POA: Diagnosis not present

## 2019-05-06 DIAGNOSIS — M9702XD Periprosthetic fracture around internal prosthetic left hip joint, subsequent encounter: Secondary | ICD-10-CM | POA: Diagnosis not present

## 2019-05-06 DIAGNOSIS — D62 Acute posthemorrhagic anemia: Secondary | ICD-10-CM | POA: Diagnosis not present

## 2019-05-06 DIAGNOSIS — K219 Gastro-esophageal reflux disease without esophagitis: Secondary | ICD-10-CM | POA: Diagnosis not present

## 2019-05-06 DIAGNOSIS — E119 Type 2 diabetes mellitus without complications: Secondary | ICD-10-CM | POA: Diagnosis not present

## 2019-05-06 DIAGNOSIS — E785 Hyperlipidemia, unspecified: Secondary | ICD-10-CM | POA: Diagnosis not present

## 2019-05-06 DIAGNOSIS — G4700 Insomnia, unspecified: Secondary | ICD-10-CM | POA: Diagnosis not present

## 2019-05-06 DIAGNOSIS — M170 Bilateral primary osteoarthritis of knee: Secondary | ICD-10-CM | POA: Diagnosis not present

## 2019-05-06 DIAGNOSIS — C61 Malignant neoplasm of prostate: Secondary | ICD-10-CM | POA: Diagnosis not present

## 2019-05-06 DIAGNOSIS — Z4789 Encounter for other orthopedic aftercare: Secondary | ICD-10-CM | POA: Diagnosis not present

## 2019-05-06 DIAGNOSIS — Z8546 Personal history of malignant neoplasm of prostate: Secondary | ICD-10-CM | POA: Diagnosis not present

## 2019-05-12 ENCOUNTER — Other Ambulatory Visit: Payer: Self-pay | Admitting: *Deleted

## 2019-05-12 DIAGNOSIS — S7292XD Unspecified fracture of left femur, subsequent encounter for closed fracture with routine healing: Secondary | ICD-10-CM | POA: Diagnosis not present

## 2019-05-12 NOTE — Patient Outreach (Signed)
Screened for potential [facility] Care Management needs as a benefit of  [employer].  Member is currently receiving skilled therapy at [facility] - Writer attended telephonic interdisciplinary team meeting to assess for disposition needs and transition plan for resident.   Facility reports member is from home alone. He is currently non weight bearing. He is s/p left hip surgery on 04/27/19.  Ortho follow up appointment today. Member has some confusion. Facility has upcoming scheduled care plan meeting with family.    Will continue to follow for disposition plans. Will plan outreach as appropriate.   Marthenia Rolling, MSN-Ed, RN,BSN [place] Acute Care Coordinator [phone] [facility]) [phone]  (Toll free office)

## 2019-05-17 ENCOUNTER — Other Ambulatory Visit: Payer: Self-pay | Admitting: *Deleted

## 2019-05-17 NOTE — Patient Outreach (Signed)
Member screened for potential [facility] Care Management needs as a benefit of [place] Medicare.  Mr. Couzens remains at [facility] receiving skilled therapy.   Spoke with facility dc planner who reports member's disposition plan is remain at facility under private pay until next ortho appointment in February. They are hopeful weight bearing status will change.  Writer will follow if member returns to skilled care at later time. Facility dc planner indicates member's ultimate goal is to return home if possible.    Marthenia Rolling, MSN-Ed, RN,BSN [place] Acute Care Coordinator [phone] [facility]) [phone]  (Toll free office)

## 2019-05-19 ENCOUNTER — Other Ambulatory Visit: Payer: Self-pay | Admitting: *Deleted

## 2019-05-19 DIAGNOSIS — D62 Acute posthemorrhagic anemia: Secondary | ICD-10-CM | POA: Diagnosis not present

## 2019-05-19 NOTE — Patient Outreach (Signed)
Screened for potential [facility] Care Management needs as a benefit of  [employer].  George [name] is currently receiving skilled therapy at [facility].  Writer attended telephonic interdisciplinary team meeting to assess for disposition needs and transition plan for resident.   Facility reports member will transition to private pay until his follow up appointment. Facility states George [name] ultimate transition plan will depend on his progress after his scheduled ortho appointment in February.  Will follow member again for potential [facility] Care Management services if/when member returns to skilled care at the facility.  Marthenia Rolling, MSN-Ed, RN,BSN [place] Acute Care Coordinator [phone] [facility]) [phone]  (Toll free office)

## 2019-05-26 DIAGNOSIS — L89610 Pressure ulcer of right heel, unstageable: Secondary | ICD-10-CM | POA: Diagnosis not present

## 2019-05-26 DIAGNOSIS — Z79899 Other long term (current) drug therapy: Secondary | ICD-10-CM | POA: Diagnosis not present

## 2019-05-26 DIAGNOSIS — D649 Anemia, unspecified: Secondary | ICD-10-CM | POA: Diagnosis not present

## 2019-05-27 DIAGNOSIS — N390 Urinary tract infection, site not specified: Secondary | ICD-10-CM | POA: Diagnosis not present

## 2019-05-27 DIAGNOSIS — Z79899 Other long term (current) drug therapy: Secondary | ICD-10-CM | POA: Diagnosis not present

## 2019-05-27 DIAGNOSIS — D649 Anemia, unspecified: Secondary | ICD-10-CM | POA: Diagnosis not present

## 2019-05-27 DIAGNOSIS — R319 Hematuria, unspecified: Secondary | ICD-10-CM | POA: Diagnosis not present

## 2019-05-27 DIAGNOSIS — R05 Cough: Secondary | ICD-10-CM | POA: Diagnosis not present

## 2019-05-27 DIAGNOSIS — R0602 Shortness of breath: Secondary | ICD-10-CM | POA: Diagnosis not present

## 2019-05-30 DIAGNOSIS — Z23 Encounter for immunization: Secondary | ICD-10-CM | POA: Diagnosis not present

## 2019-05-31 DIAGNOSIS — D649 Anemia, unspecified: Secondary | ICD-10-CM | POA: Diagnosis not present

## 2019-05-31 DIAGNOSIS — Z79899 Other long term (current) drug therapy: Secondary | ICD-10-CM | POA: Diagnosis not present

## 2019-06-02 DIAGNOSIS — D649 Anemia, unspecified: Secondary | ICD-10-CM | POA: Diagnosis not present

## 2019-06-02 DIAGNOSIS — Z79899 Other long term (current) drug therapy: Secondary | ICD-10-CM | POA: Diagnosis not present

## 2019-06-02 DIAGNOSIS — D72829 Elevated white blood cell count, unspecified: Secondary | ICD-10-CM | POA: Diagnosis not present

## 2019-06-02 DIAGNOSIS — L89610 Pressure ulcer of right heel, unstageable: Secondary | ICD-10-CM | POA: Diagnosis not present

## 2019-06-02 DIAGNOSIS — N390 Urinary tract infection, site not specified: Secondary | ICD-10-CM | POA: Diagnosis not present

## 2019-06-03 DIAGNOSIS — Z79899 Other long term (current) drug therapy: Secondary | ICD-10-CM | POA: Diagnosis not present

## 2019-06-03 DIAGNOSIS — D649 Anemia, unspecified: Secondary | ICD-10-CM | POA: Diagnosis not present

## 2019-06-08 DIAGNOSIS — R319 Hematuria, unspecified: Secondary | ICD-10-CM | POA: Diagnosis not present

## 2019-06-08 DIAGNOSIS — N390 Urinary tract infection, site not specified: Secondary | ICD-10-CM | POA: Diagnosis not present

## 2019-06-08 DIAGNOSIS — Z79899 Other long term (current) drug therapy: Secondary | ICD-10-CM | POA: Diagnosis not present

## 2019-06-09 DIAGNOSIS — L89610 Pressure ulcer of right heel, unstageable: Secondary | ICD-10-CM | POA: Diagnosis not present

## 2019-06-10 DIAGNOSIS — Z471 Aftercare following joint replacement surgery: Secondary | ICD-10-CM | POA: Diagnosis not present

## 2019-06-10 DIAGNOSIS — S7222XD Displaced subtrochanteric fracture of left femur, subsequent encounter for closed fracture with routine healing: Secondary | ICD-10-CM | POA: Diagnosis not present

## 2019-06-13 DIAGNOSIS — R1311 Dysphagia, oral phase: Secondary | ICD-10-CM | POA: Diagnosis not present

## 2019-06-13 DIAGNOSIS — M9702XD Periprosthetic fracture around internal prosthetic left hip joint, subsequent encounter: Secondary | ICD-10-CM | POA: Diagnosis not present

## 2019-06-13 DIAGNOSIS — N390 Urinary tract infection, site not specified: Secondary | ICD-10-CM | POA: Diagnosis not present

## 2019-06-13 DIAGNOSIS — E785 Hyperlipidemia, unspecified: Secondary | ICD-10-CM | POA: Diagnosis not present

## 2019-06-13 DIAGNOSIS — Z23 Encounter for immunization: Secondary | ICD-10-CM | POA: Diagnosis not present

## 2019-06-13 DIAGNOSIS — R489 Unspecified symbolic dysfunctions: Secondary | ICD-10-CM | POA: Diagnosis not present

## 2019-06-13 DIAGNOSIS — Z96642 Presence of left artificial hip joint: Secondary | ICD-10-CM | POA: Diagnosis not present

## 2019-06-13 DIAGNOSIS — A491 Streptococcal infection, unspecified site: Secondary | ICD-10-CM | POA: Diagnosis not present

## 2019-06-13 DIAGNOSIS — L03114 Cellulitis of left upper limb: Secondary | ICD-10-CM | POA: Diagnosis not present

## 2019-06-13 DIAGNOSIS — R0989 Other specified symptoms and signs involving the circulatory and respiratory systems: Secondary | ICD-10-CM | POA: Diagnosis not present

## 2019-06-13 DIAGNOSIS — M62838 Other muscle spasm: Secondary | ICD-10-CM | POA: Diagnosis not present

## 2019-06-13 DIAGNOSIS — L89610 Pressure ulcer of right heel, unstageable: Secondary | ICD-10-CM | POA: Diagnosis not present

## 2019-06-13 DIAGNOSIS — R079 Chest pain, unspecified: Secondary | ICD-10-CM | POA: Diagnosis not present

## 2019-06-13 DIAGNOSIS — N179 Acute kidney failure, unspecified: Secondary | ICD-10-CM | POA: Diagnosis not present

## 2019-06-13 DIAGNOSIS — G4700 Insomnia, unspecified: Secondary | ICD-10-CM | POA: Diagnosis not present

## 2019-06-13 DIAGNOSIS — Z8546 Personal history of malignant neoplasm of prostate: Secondary | ICD-10-CM | POA: Diagnosis not present

## 2019-06-13 DIAGNOSIS — R1319 Other dysphagia: Secondary | ICD-10-CM | POA: Diagnosis not present

## 2019-06-13 DIAGNOSIS — I482 Chronic atrial fibrillation, unspecified: Secondary | ICD-10-CM | POA: Diagnosis not present

## 2019-06-13 DIAGNOSIS — D72829 Elevated white blood cell count, unspecified: Secondary | ICD-10-CM | POA: Diagnosis not present

## 2019-06-13 DIAGNOSIS — I5033 Acute on chronic diastolic (congestive) heart failure: Secondary | ICD-10-CM | POA: Diagnosis not present

## 2019-06-13 DIAGNOSIS — K449 Diaphragmatic hernia without obstruction or gangrene: Secondary | ICD-10-CM | POA: Diagnosis not present

## 2019-06-13 DIAGNOSIS — M170 Bilateral primary osteoarthritis of knee: Secondary | ICD-10-CM | POA: Diagnosis not present

## 2019-06-13 DIAGNOSIS — E119 Type 2 diabetes mellitus without complications: Secondary | ICD-10-CM | POA: Diagnosis not present

## 2019-06-13 DIAGNOSIS — G629 Polyneuropathy, unspecified: Secondary | ICD-10-CM | POA: Diagnosis not present

## 2019-06-13 DIAGNOSIS — D62 Acute posthemorrhagic anemia: Secondary | ICD-10-CM | POA: Diagnosis not present

## 2019-06-13 DIAGNOSIS — M978XXD Periprosthetic fracture around other internal prosthetic joint, subsequent encounter: Secondary | ICD-10-CM | POA: Diagnosis not present

## 2019-06-13 DIAGNOSIS — Z7901 Long term (current) use of anticoagulants: Secondary | ICD-10-CM | POA: Diagnosis not present

## 2019-06-13 DIAGNOSIS — D72820 Lymphocytosis (symptomatic): Secondary | ICD-10-CM | POA: Diagnosis not present

## 2019-06-13 DIAGNOSIS — K5900 Constipation, unspecified: Secondary | ICD-10-CM | POA: Diagnosis not present

## 2019-06-13 DIAGNOSIS — Z4789 Encounter for other orthopedic aftercare: Secondary | ICD-10-CM | POA: Diagnosis not present

## 2019-06-13 DIAGNOSIS — R112 Nausea with vomiting, unspecified: Secondary | ICD-10-CM | POA: Diagnosis not present

## 2019-06-13 DIAGNOSIS — K219 Gastro-esophageal reflux disease without esophagitis: Secondary | ICD-10-CM | POA: Diagnosis not present

## 2019-06-13 DIAGNOSIS — I10 Essential (primary) hypertension: Secondary | ICD-10-CM | POA: Diagnosis not present

## 2019-06-13 DIAGNOSIS — M25552 Pain in left hip: Secondary | ICD-10-CM | POA: Diagnosis not present

## 2019-06-13 DIAGNOSIS — F4320 Adjustment disorder, unspecified: Secondary | ICD-10-CM | POA: Diagnosis not present

## 2019-06-13 DIAGNOSIS — F329 Major depressive disorder, single episode, unspecified: Secondary | ICD-10-CM | POA: Diagnosis not present

## 2019-06-13 DIAGNOSIS — R05 Cough: Secondary | ICD-10-CM | POA: Diagnosis not present

## 2019-06-16 DIAGNOSIS — L89610 Pressure ulcer of right heel, unstageable: Secondary | ICD-10-CM | POA: Diagnosis not present

## 2019-06-17 ENCOUNTER — Other Ambulatory Visit: Payer: Self-pay | Admitting: *Deleted

## 2019-06-17 NOTE — Patient Outreach (Signed)
Late entry for 06/16/19  Screened for potential [facility] Care Management needs as a benefit of  [employer] Medicare.  Mr. Mohammadi is currently receiving skilled therapy at [facility].  Writer attended telephonic interdisciplinary team meeting to assess for disposition needs and transition plan for resident.   Facility reports member is back on therapy now from paying privately. States member's plan is to return home at [facility] dc. Lived alone prior.  Will continue to follow for transition plans and for potential [facility] Care Management needs while member resides in [facility].    Marthenia Rolling, MSN-Ed, RN,BSN [place] Acute Care Coordinator [phone] [facility]) [phone]  (Toll free office)

## 2019-06-22 DIAGNOSIS — F329 Major depressive disorder, single episode, unspecified: Secondary | ICD-10-CM | POA: Diagnosis not present

## 2019-06-22 DIAGNOSIS — G4700 Insomnia, unspecified: Secondary | ICD-10-CM | POA: Diagnosis not present

## 2019-06-22 DIAGNOSIS — L89610 Pressure ulcer of right heel, unstageable: Secondary | ICD-10-CM | POA: Diagnosis not present

## 2019-06-22 DIAGNOSIS — F4320 Adjustment disorder, unspecified: Secondary | ICD-10-CM | POA: Diagnosis not present

## 2019-06-23 DIAGNOSIS — D72829 Elevated white blood cell count, unspecified: Secondary | ICD-10-CM | POA: Diagnosis not present

## 2019-06-23 DIAGNOSIS — L03114 Cellulitis of left upper limb: Secondary | ICD-10-CM | POA: Diagnosis not present

## 2019-06-26 DIAGNOSIS — N390 Urinary tract infection, site not specified: Secondary | ICD-10-CM | POA: Diagnosis not present

## 2019-06-26 DIAGNOSIS — A491 Streptococcal infection, unspecified site: Secondary | ICD-10-CM | POA: Diagnosis not present

## 2019-06-26 DIAGNOSIS — D72829 Elevated white blood cell count, unspecified: Secondary | ICD-10-CM | POA: Diagnosis not present

## 2019-06-30 DIAGNOSIS — L89610 Pressure ulcer of right heel, unstageable: Secondary | ICD-10-CM | POA: Diagnosis not present

## 2019-06-30 DIAGNOSIS — R112 Nausea with vomiting, unspecified: Secondary | ICD-10-CM | POA: Diagnosis not present

## 2019-07-04 ENCOUNTER — Other Ambulatory Visit: Payer: Self-pay | Admitting: *Deleted

## 2019-07-04 NOTE — Patient Outreach (Signed)
[place] Coordinator follow up  Mr. Fuhr has transitioned to private pay at [facility].   No identifiable [facility] Care Management needs at this time.   Marthenia Rolling, MSN-Ed, RN,BSN [place]  Acute Care Coordinator [phone] [facility]) [phone]  (Toll free office)

## 2019-07-07 DIAGNOSIS — L89610 Pressure ulcer of right heel, unstageable: Secondary | ICD-10-CM | POA: Diagnosis not present

## 2019-07-12 DIAGNOSIS — N390 Urinary tract infection, site not specified: Secondary | ICD-10-CM | POA: Diagnosis not present

## 2019-07-12 DIAGNOSIS — I10 Essential (primary) hypertension: Secondary | ICD-10-CM | POA: Diagnosis not present

## 2019-07-13 DIAGNOSIS — F4320 Adjustment disorder, unspecified: Secondary | ICD-10-CM | POA: Diagnosis not present

## 2019-07-13 DIAGNOSIS — F22 Delusional disorders: Secondary | ICD-10-CM | POA: Diagnosis not present

## 2019-07-13 DIAGNOSIS — F0391 Unspecified dementia with behavioral disturbance: Secondary | ICD-10-CM | POA: Diagnosis not present

## 2019-07-13 DIAGNOSIS — G4700 Insomnia, unspecified: Secondary | ICD-10-CM | POA: Diagnosis not present

## 2019-07-13 DIAGNOSIS — F329 Major depressive disorder, single episode, unspecified: Secondary | ICD-10-CM | POA: Diagnosis not present

## 2019-07-13 DIAGNOSIS — F419 Anxiety disorder, unspecified: Secondary | ICD-10-CM | POA: Diagnosis not present

## 2019-07-14 DIAGNOSIS — L89610 Pressure ulcer of right heel, unstageable: Secondary | ICD-10-CM | POA: Diagnosis not present

## 2019-07-20 DIAGNOSIS — L89610 Pressure ulcer of right heel, unstageable: Secondary | ICD-10-CM | POA: Diagnosis not present

## 2019-07-27 DIAGNOSIS — L89610 Pressure ulcer of right heel, unstageable: Secondary | ICD-10-CM | POA: Diagnosis not present

## 2019-07-27 DIAGNOSIS — L97509 Non-pressure chronic ulcer of other part of unspecified foot with unspecified severity: Secondary | ICD-10-CM | POA: Diagnosis not present

## 2019-07-28 DIAGNOSIS — M25562 Pain in left knee: Secondary | ICD-10-CM | POA: Diagnosis not present

## 2019-07-28 DIAGNOSIS — M25552 Pain in left hip: Secondary | ICD-10-CM | POA: Diagnosis not present

## 2019-07-28 DIAGNOSIS — M79652 Pain in left thigh: Secondary | ICD-10-CM | POA: Diagnosis not present

## 2019-08-03 DIAGNOSIS — L89610 Pressure ulcer of right heel, unstageable: Secondary | ICD-10-CM | POA: Diagnosis not present

## 2019-08-10 DIAGNOSIS — L89610 Pressure ulcer of right heel, unstageable: Secondary | ICD-10-CM | POA: Diagnosis not present

## 2019-08-17 DIAGNOSIS — I5033 Acute on chronic diastolic (congestive) heart failure: Secondary | ICD-10-CM | POA: Diagnosis not present

## 2019-08-17 DIAGNOSIS — L89610 Pressure ulcer of right heel, unstageable: Secondary | ICD-10-CM | POA: Diagnosis not present

## 2019-08-17 DIAGNOSIS — N390 Urinary tract infection, site not specified: Secondary | ICD-10-CM | POA: Diagnosis not present

## 2019-08-17 DIAGNOSIS — M9702XD Periprosthetic fracture around internal prosthetic left hip joint, subsequent encounter: Secondary | ICD-10-CM | POA: Diagnosis not present

## 2019-08-17 DIAGNOSIS — R293 Abnormal posture: Secondary | ICD-10-CM | POA: Diagnosis not present

## 2019-08-19 DIAGNOSIS — I5033 Acute on chronic diastolic (congestive) heart failure: Secondary | ICD-10-CM | POA: Diagnosis not present

## 2019-08-19 DIAGNOSIS — M9702XD Periprosthetic fracture around internal prosthetic left hip joint, subsequent encounter: Secondary | ICD-10-CM | POA: Diagnosis not present

## 2019-08-19 DIAGNOSIS — R293 Abnormal posture: Secondary | ICD-10-CM | POA: Diagnosis not present

## 2019-08-19 DIAGNOSIS — N390 Urinary tract infection, site not specified: Secondary | ICD-10-CM | POA: Diagnosis not present

## 2019-08-22 DIAGNOSIS — N390 Urinary tract infection, site not specified: Secondary | ICD-10-CM | POA: Diagnosis not present

## 2019-08-22 DIAGNOSIS — M9702XD Periprosthetic fracture around internal prosthetic left hip joint, subsequent encounter: Secondary | ICD-10-CM | POA: Diagnosis not present

## 2019-08-22 DIAGNOSIS — R293 Abnormal posture: Secondary | ICD-10-CM | POA: Diagnosis not present

## 2019-08-22 DIAGNOSIS — I5033 Acute on chronic diastolic (congestive) heart failure: Secondary | ICD-10-CM | POA: Diagnosis not present

## 2019-08-24 DIAGNOSIS — G4700 Insomnia, unspecified: Secondary | ICD-10-CM | POA: Diagnosis not present

## 2019-08-24 DIAGNOSIS — L89610 Pressure ulcer of right heel, unstageable: Secondary | ICD-10-CM | POA: Diagnosis not present

## 2019-08-24 DIAGNOSIS — F0391 Unspecified dementia with behavioral disturbance: Secondary | ICD-10-CM | POA: Diagnosis not present

## 2019-08-24 DIAGNOSIS — F419 Anxiety disorder, unspecified: Secondary | ICD-10-CM | POA: Diagnosis not present

## 2019-08-24 DIAGNOSIS — F22 Delusional disorders: Secondary | ICD-10-CM | POA: Diagnosis not present

## 2019-08-24 DIAGNOSIS — F329 Major depressive disorder, single episode, unspecified: Secondary | ICD-10-CM | POA: Diagnosis not present

## 2019-08-24 DIAGNOSIS — F4320 Adjustment disorder, unspecified: Secondary | ICD-10-CM | POA: Diagnosis not present

## 2019-08-26 DIAGNOSIS — M9702XD Periprosthetic fracture around internal prosthetic left hip joint, subsequent encounter: Secondary | ICD-10-CM | POA: Diagnosis not present

## 2019-08-26 DIAGNOSIS — R293 Abnormal posture: Secondary | ICD-10-CM | POA: Diagnosis not present

## 2019-08-26 DIAGNOSIS — N390 Urinary tract infection, site not specified: Secondary | ICD-10-CM | POA: Diagnosis not present

## 2019-08-26 DIAGNOSIS — I5033 Acute on chronic diastolic (congestive) heart failure: Secondary | ICD-10-CM | POA: Diagnosis not present

## 2019-08-31 DIAGNOSIS — L89610 Pressure ulcer of right heel, unstageable: Secondary | ICD-10-CM | POA: Diagnosis not present

## 2019-09-05 DIAGNOSIS — R319 Hematuria, unspecified: Secondary | ICD-10-CM | POA: Diagnosis not present

## 2019-09-05 DIAGNOSIS — N390 Urinary tract infection, site not specified: Secondary | ICD-10-CM | POA: Diagnosis not present

## 2019-09-07 DIAGNOSIS — L89613 Pressure ulcer of right heel, stage 3: Secondary | ICD-10-CM | POA: Diagnosis not present

## 2019-09-14 DIAGNOSIS — L89613 Pressure ulcer of right heel, stage 3: Secondary | ICD-10-CM | POA: Diagnosis not present

## 2019-09-20 ENCOUNTER — Other Ambulatory Visit: Payer: Self-pay | Admitting: *Deleted

## 2019-09-20 ENCOUNTER — Ambulatory Visit: Payer: Medicare Other | Admitting: Podiatry

## 2019-09-20 DIAGNOSIS — M79606 Pain in leg, unspecified: Secondary | ICD-10-CM

## 2019-09-21 DIAGNOSIS — L89613 Pressure ulcer of right heel, stage 3: Secondary | ICD-10-CM | POA: Diagnosis not present

## 2019-09-22 ENCOUNTER — Ambulatory Visit (HOSPITAL_COMMUNITY)
Admission: RE | Admit: 2019-09-22 | Discharge: 2019-09-22 | Disposition: A | Discharge status: Home / Self Care | Payer: Medicare Other | Source: Ambulatory Visit | Attending: Vascular Surgery | Admitting: Vascular Surgery

## 2019-09-22 ENCOUNTER — Encounter: Payer: Self-pay | Admitting: Vascular Surgery

## 2019-09-22 ENCOUNTER — Other Ambulatory Visit: Payer: Self-pay

## 2019-09-22 ENCOUNTER — Ambulatory Visit (INDEPENDENT_AMBULATORY_CARE_PROVIDER_SITE_OTHER): Payer: Medicare Other | Admitting: Vascular Surgery

## 2019-09-22 VITALS — BP 105/65 | HR 81 | Temp 97.6°F | Resp 20 | Ht 70.0 in | Wt 185.0 lb

## 2019-09-22 DIAGNOSIS — L97412 Non-pressure chronic ulcer of right heel and midfoot with fat layer exposed: Secondary | ICD-10-CM

## 2019-09-22 DIAGNOSIS — M79606 Pain in leg, unspecified: Secondary | ICD-10-CM | POA: Diagnosis not present

## 2019-09-22 NOTE — Progress Notes (Signed)
Referring Physician: Dr Josetta Huddle  Patient name: George [name] MRN: [id number] DOB: 11/24/1923 Sex: male  REASON FOR CONSULT: Right heel ulcer  HPI: George [name] is a 86 y.o. male, with an 76-month history of an ulceration in his right heel. He is nonambulatory. Most recently he had revision after a hip fracture from a prosthetic in the left leg. He states that currently they are doing local wound care on this at [facility] home. He does not really complain of much pain in the ulcer. Other medical problems include atrial fibrillation for which she is on Eliquis. He also has a history of slightly elevated creatinine at 1.3 in December 2020. Elevated cholesterol multijoint arthritis peripheral neuropathy all of which have been stable.  Past Medical History:  Diagnosis Date  . AKI (acute kidney injury) ([place]) 04/25/2019  . BPH (benign prostatic hypertrophy)   . Chronic atrial fibrillation (HCC)    CHA2DS2VASC score 3    . Esophageal ring   . GERD (gastroesophageal reflux disease)   . History of GI bleed   . Hypercholesteremia   . Osteoarthritis   . Osteoporosis   . Peripheral neuropathy   . Peripheral neuropathy   . Prostate cancer ([place])    treated with observation  . Trigger finger of both hands   . Vertigo   . Vitamin D deficiency    Past Surgical History:  Procedure Laterality Date  . BALLOON DILATION N/A 08/10/2013   Procedure: BALLOON DILATION;  Surgeon: Arta Silence, MD;  Location: [facility];  Service: Endoscopy;  Laterality: N/A;  . BOTOX INJECTION N/A 08/10/2013   Procedure: BOTOX INJECTION;  Surgeon: Arta Silence, MD;  Location: [facility];  Service: Endoscopy;  Laterality: N/A;  + or -      balloon/botox  . CARDIAC CATHETERIZATION    . CHOLECYSTECTOMY  2010  . ESOPHAGOGASTRODUODENOSCOPY (EGD) WITH PROPOFOL N/A 08/10/2013   Procedure: ESOPHAGOGASTRODUODENOSCOPY (EGD) WITH PROPOFOL;  Surgeon: Arta Silence, MD;  Location: [facility];  Service:  Endoscopy;  Laterality: N/A;  . INGUINAL HERNIA REPAIR    . KNEE ARTHROCENTESIS    . skin cancer removed from face  1 week ago   area healing well, some areas reved in past also  . TOTAL HIP ARTHROPLASTY Bilateral yrs ago   hips  . TOTAL HIP REVISION Left 04/27/2019   Procedure: LEFT PERIPROSTHETIC FEMUR FRACTURE WITH ZIMMER CABLE AND PLATE;  Surgeon: Gaynelle Arabian, MD;  Location: [facility] ORS;  Service: Orthopedics;  Laterality: Left;    Family History  Problem Relation Age of Onset  . Cancer - Other Father     SOCIAL HISTORY: Social History   Socioeconomic History  . Marital status: Widowed    Spouse name: Not on file  . Number of children: Not on file  . Years of education: Not on file  . Highest education level: Not on file  Occupational History  . Not on file  Tobacco Use  . Smoking status: Never Smoker  . Smokeless tobacco: Never Used  Substance and Sexual Activity  . Alcohol use: No  . Drug use: No  . Sexual activity: Not on file  Other Topics Concern  . Not on file  Social History Narrative  . Not on file   Social Determinants of Health   Financial Resource Strain:   . Difficulty of Paying Living Expenses:   Food Insecurity:   . Worried About Charity fundraiser in the Last Year:   . [employer]  of Food in the Last Year:   Transportation Needs:   . Film/video editor (Medical):   Marland Kitchen Lack of Transportation (Non-Medical):   Physical Activity:   . Days of Exercise per Week:   . Minutes of Exercise per Session:   Stress:   . Feeling of Stress :   Social Connections:   . Frequency of Communication with Friends and Family:   . Frequency of Social Gatherings with Friends and Family:   . Attends Religious Services:   . Active Member of Clubs or Organizations:   . Attends Archivist Meetings:   Marland Kitchen Marital Status:   Intimate Partner Violence:   . Fear of Current or Ex-Partner:   . Emotionally Abused:   Marland Kitchen Physically Abused:   . Sexually Abused:      Allergies  Allergen Reactions  . Aspirin Other (See Comments)    bleeding    Current Outpatient Medications  Medication Sig Dispense Refill  . acetaminophen (TYLENOL) 650 MG CR tablet Take 650 mg by mouth daily as needed for pain.    Marland Kitchen apixaban (ELIQUIS) 5 MG TABS tablet Take 1 tablet (5 mg total) by mouth 2 (two) times daily. (Patient taking differently: Take 5 mg by mouth daily. ) 60 tablet   . diltiazem (CARDIZEM CD) 120 MG 24 hr capsule Take 1 capsule (120 mg total) by mouth daily. 30 capsule 11  . docusate sodium (COLACE) 100 MG capsule Take 1 capsule (100 mg total) by mouth 2 (two) times daily. 10 capsule 0  . feeding supplement, ENSURE ENLIVE, (ENSURE ENLIVE) LIQD Take 237 mLs by mouth 2 (two) times daily between meals. 237 mL 12  . memantine (NAMENDA) 5 MG tablet Take 5 mg by mouth 2 (two) times daily.    . methocarbamol (ROBAXIN) 500 MG tablet Take 1 tablet (500 mg total) by mouth every 6 (six) hours as needed for muscle spasms. 40 tablet 0  . Multiple Vitamin (MULTIVITAMIN WITH MINERALS) TABS tablet Take 1 tablet by mouth daily. 30 tablet 0  . nitroGLYCERIN (NITROSTAT) 0.4 MG SL tablet Place 1 tablet (0.4 mg total) under the tongue every 5 (five) minutes as needed for chest pain. 30 tablet 12  . oxyCODONE (OXY IR/ROXICODONE) 5 MG immediate release tablet Take 1-2 tablets (5-10 mg total) by mouth every 6 (six) hours as needed for moderate pain or severe pain (pain score 4-6). 56 tablet 0  . pantoprazole (PROTONIX) 40 MG tablet Take 1 tablet (40 mg total) by mouth daily. 30 tablet 3  . polyethylene glycol (MIRALAX / GLYCOLAX) 17 g packet Take 17 g by mouth 2 (two) times daily. 14 each 0  . rosuvastatin (CRESTOR) 10 MG tablet Take 10 mg by mouth daily.     Marland Kitchen SANTYL ointment Apply 1 application topically daily.    . traZODone (DESYREL) 100 MG tablet trazodone 100 mg tablet     No current facility-administered medications for this visit.    ROS:   General:  No weight loss,  Fever, chills  HEENT: No recent headaches, no nasal bleeding, no visual changes, no sore throat  Neurologic: No dizziness, blackouts, seizures. No recent symptoms of stroke or mini- stroke. No recent episodes of slurred speech, or temporary blindness.  Cardiac: No recent episodes of chest pain/pressure, no shortness of breath at rest.  + shortness of breath with exertion. +history of atrial fibrillation or irregular heartbeat  Vascular: No history of rest pain in feet.  No history of claudication.  + history  of non-healing ulcer, No history of DVT   Pulmonary: No home oxygen, no productive cough, no hemoptysis,  No asthma or wheezing  Musculoskeletal:  [X]  Arthritis, [ ]  Low back pain,  [X]  Joint pain  Hematologic:No history of hypercoagulable state.  No history of easy bleeding.  No history of anemia  Gastrointestinal: No hematochezia or melena,  No gastroesophageal reflux, no trouble swallowing  Urinary: [X]  chronic Kidney disease, [ ]  on HD - [ ]  MWF or [ ]  TTHS, [ ]  Burning with urination, [ ]  Frequent urination, [ ]  Difficulty urinating;   Skin: No rashes  Psychological: No history of anxiety,  No history of depression   Physical Examination  Vitals:   09/22/19 1046  BP: 105/65  Pulse: 81  Resp: 20  Temp: 97.6 F (36.4 C)  SpO2: 96%  Weight: 185 lb (83.9 kg)  Height: 5\' 10"  (1.778 m)    Body mass index is 26.54 kg/m.  General:  Alert and oriented, no acute distress HEENT: Normal Neck: No JVD Cardiac: Regular Rate and Rhythm Skin: No rash, 3 x 2 cm 1 mm depth ulcer lateral aspect right heel with 100% granulation tissue, multiple areas of ecchymosis extremity Pulses:  2+ radial, brachial, femoral, dorsalis pedis pulses bilaterally Musculoskeletal: No deformity or edema  Neurologic: Upper and lower extremity motor 5/5 and symmetric  DATA:  She had bilateral ABIs performed today which were triphasic waveforms but noncompressible. Toe pressure on the left was 112  toe pressure on the right was 180  ASSESSMENT: Right heel ulcer with no evidence of arterial occlusive disease with palpable pulses and adequate toe pressure for wound healing. Ulceration is most likely decubitus in nature. I discussed with the patient offloading the heel and proper nutrition to increase chances of wound healing. This was also written on his skilled nursing facility worksheet.   PLAN: Patient will try to offload the right heel. He will try to eat more.  We will refer him to the wound center for further evaluation. He will follow up with [place] on an as-needed basis.   Ruta Hinds, MD [facility] Office: [phone] Pager: [phone]

## 2019-09-28 DIAGNOSIS — L89613 Pressure ulcer of right heel, stage 3: Secondary | ICD-10-CM | POA: Diagnosis not present

## 2019-09-28 DIAGNOSIS — E119 Type 2 diabetes mellitus without complications: Secondary | ICD-10-CM | POA: Diagnosis not present

## 2019-09-28 DIAGNOSIS — Z961 Presence of intraocular lens: Secondary | ICD-10-CM | POA: Diagnosis not present

## 2019-10-05 DIAGNOSIS — F329 Major depressive disorder, single episode, unspecified: Secondary | ICD-10-CM | POA: Diagnosis not present

## 2019-10-05 DIAGNOSIS — F0391 Unspecified dementia with behavioral disturbance: Secondary | ICD-10-CM | POA: Diagnosis not present

## 2019-10-05 DIAGNOSIS — G4700 Insomnia, unspecified: Secondary | ICD-10-CM | POA: Diagnosis not present

## 2019-10-05 DIAGNOSIS — L89613 Pressure ulcer of right heel, stage 3: Secondary | ICD-10-CM | POA: Diagnosis not present

## 2019-10-05 DIAGNOSIS — F419 Anxiety disorder, unspecified: Secondary | ICD-10-CM | POA: Diagnosis not present

## 2019-10-06 ENCOUNTER — Encounter (HOSPITAL_BASED_OUTPATIENT_CLINIC_OR_DEPARTMENT_OTHER): Payer: Medicare Other | Admitting: Internal Medicine

## 2019-10-12 DIAGNOSIS — L89613 Pressure ulcer of right heel, stage 3: Secondary | ICD-10-CM | POA: Diagnosis not present

## 2019-10-19 DIAGNOSIS — L89613 Pressure ulcer of right heel, stage 3: Secondary | ICD-10-CM | POA: Diagnosis not present

## 2019-10-26 DIAGNOSIS — L89613 Pressure ulcer of right heel, stage 3: Secondary | ICD-10-CM | POA: Diagnosis not present

## 2019-11-02 DIAGNOSIS — L89613 Pressure ulcer of right heel, stage 3: Secondary | ICD-10-CM | POA: Diagnosis not present

## 2019-11-09 DIAGNOSIS — L89890 Pressure ulcer of other site, unstageable: Secondary | ICD-10-CM | POA: Diagnosis not present

## 2019-11-16 DIAGNOSIS — I70245 Atherosclerosis of native arteries of left leg with ulceration of other part of foot: Secondary | ICD-10-CM | POA: Diagnosis not present

## 2019-11-23 DIAGNOSIS — I70235 Atherosclerosis of native arteries of right leg with ulceration of other part of foot: Secondary | ICD-10-CM | POA: Diagnosis not present

## 2019-11-23 DIAGNOSIS — I70245 Atherosclerosis of native arteries of left leg with ulceration of other part of foot: Secondary | ICD-10-CM | POA: Diagnosis not present

## 2019-11-30 DIAGNOSIS — I70245 Atherosclerosis of native arteries of left leg with ulceration of other part of foot: Secondary | ICD-10-CM | POA: Diagnosis not present

## 2019-11-30 DIAGNOSIS — I70235 Atherosclerosis of native arteries of right leg with ulceration of other part of foot: Secondary | ICD-10-CM | POA: Diagnosis not present

## 2019-12-04 DIAGNOSIS — R05 Cough: Secondary | ICD-10-CM | POA: Diagnosis not present

## 2019-12-04 DIAGNOSIS — I10 Essential (primary) hypertension: Secondary | ICD-10-CM | POA: Diagnosis not present

## 2019-12-04 DIAGNOSIS — D649 Anemia, unspecified: Secondary | ICD-10-CM | POA: Diagnosis not present

## 2019-12-06 DIAGNOSIS — Z79899 Other long term (current) drug therapy: Secondary | ICD-10-CM | POA: Diagnosis not present

## 2019-12-06 DIAGNOSIS — I10 Essential (primary) hypertension: Secondary | ICD-10-CM | POA: Diagnosis not present

## 2019-12-06 DIAGNOSIS — R69 Illness, unspecified: Secondary | ICD-10-CM | POA: Diagnosis not present

## 2019-12-06 DIAGNOSIS — D649 Anemia, unspecified: Secondary | ICD-10-CM | POA: Diagnosis not present

## 2019-12-07 DIAGNOSIS — I70245 Atherosclerosis of native arteries of left leg with ulceration of other part of foot: Secondary | ICD-10-CM | POA: Diagnosis not present

## 2019-12-07 DIAGNOSIS — I70235 Atherosclerosis of native arteries of right leg with ulceration of other part of foot: Secondary | ICD-10-CM | POA: Diagnosis not present

## 2019-12-11 DIAGNOSIS — D649 Anemia, unspecified: Secondary | ICD-10-CM | POA: Diagnosis not present

## 2019-12-11 DIAGNOSIS — I10 Essential (primary) hypertension: Secondary | ICD-10-CM | POA: Diagnosis not present

## 2019-12-11 DIAGNOSIS — J189 Pneumonia, unspecified organism: Secondary | ICD-10-CM | POA: Diagnosis not present

## 2019-12-12 DIAGNOSIS — R69 Illness, unspecified: Secondary | ICD-10-CM | POA: Diagnosis not present

## 2019-12-12 DIAGNOSIS — I10 Essential (primary) hypertension: Secondary | ICD-10-CM | POA: Diagnosis not present

## 2019-12-12 DIAGNOSIS — N390 Urinary tract infection, site not specified: Secondary | ICD-10-CM | POA: Diagnosis not present

## 2019-12-12 DIAGNOSIS — Z79899 Other long term (current) drug therapy: Secondary | ICD-10-CM | POA: Diagnosis not present

## 2019-12-12 DIAGNOSIS — R0602 Shortness of breath: Secondary | ICD-10-CM | POA: Diagnosis not present

## 2019-12-14 DIAGNOSIS — I70245 Atherosclerosis of native arteries of left leg with ulceration of other part of foot: Secondary | ICD-10-CM | POA: Diagnosis not present

## 2019-12-14 DIAGNOSIS — I70235 Atherosclerosis of native arteries of right leg with ulceration of other part of foot: Secondary | ICD-10-CM | POA: Diagnosis not present

## 2019-12-15 DIAGNOSIS — R05 Cough: Secondary | ICD-10-CM | POA: Diagnosis not present

## 2019-12-15 DIAGNOSIS — D649 Anemia, unspecified: Secondary | ICD-10-CM | POA: Diagnosis not present

## 2019-12-15 DIAGNOSIS — I10 Essential (primary) hypertension: Secondary | ICD-10-CM | POA: Diagnosis not present

## 2019-12-18 DIAGNOSIS — G9341 Metabolic encephalopathy: Secondary | ICD-10-CM | POA: Diagnosis not present

## 2019-12-18 DIAGNOSIS — J189 Pneumonia, unspecified organism: Secondary | ICD-10-CM | POA: Diagnosis not present

## 2019-12-19 DIAGNOSIS — I10 Essential (primary) hypertension: Secondary | ICD-10-CM | POA: Diagnosis not present

## 2019-12-19 DIAGNOSIS — J189 Pneumonia, unspecified organism: Secondary | ICD-10-CM | POA: Diagnosis not present

## 2019-12-19 DIAGNOSIS — D649 Anemia, unspecified: Secondary | ICD-10-CM | POA: Diagnosis not present

## 2019-12-21 DIAGNOSIS — I70245 Atherosclerosis of native arteries of left leg with ulceration of other part of foot: Secondary | ICD-10-CM | POA: Diagnosis not present

## 2019-12-21 DIAGNOSIS — I70235 Atherosclerosis of native arteries of right leg with ulceration of other part of foot: Secondary | ICD-10-CM | POA: Diagnosis not present

## 2019-12-28 DIAGNOSIS — I70235 Atherosclerosis of native arteries of right leg with ulceration of other part of foot: Secondary | ICD-10-CM | POA: Diagnosis not present

## 2019-12-28 DIAGNOSIS — F0391 Unspecified dementia with behavioral disturbance: Secondary | ICD-10-CM | POA: Diagnosis not present

## 2019-12-28 DIAGNOSIS — I70245 Atherosclerosis of native arteries of left leg with ulceration of other part of foot: Secondary | ICD-10-CM | POA: Diagnosis not present

## 2019-12-28 DIAGNOSIS — G4700 Insomnia, unspecified: Secondary | ICD-10-CM | POA: Diagnosis not present

## 2019-12-28 DIAGNOSIS — F418 Other specified anxiety disorders: Secondary | ICD-10-CM | POA: Diagnosis not present

## 2019-12-28 DIAGNOSIS — F331 Major depressive disorder, recurrent, moderate: Secondary | ICD-10-CM | POA: Diagnosis not present

## 2020-01-03 DIAGNOSIS — R05 Cough: Secondary | ICD-10-CM | POA: Diagnosis not present

## 2020-01-04 DIAGNOSIS — S91101A Unspecified open wound of right great toe without damage to nail, initial encounter: Secondary | ICD-10-CM | POA: Diagnosis not present

## 2020-02-03 DEATH — deceased

## 2020-03-11 IMAGING — CT CT ABD-PELV W/O CM
2 of 4 series · 16 of 46 positions shown, 18 images · non-contrast
Comparison: 12/09/2009

CLINICAL DATA: Fall, left hip pain. Anemia. Evaluate for
retroperitoneal bleed.

EXAM:
CT ABDOMEN AND PELVIS WITHOUT CONTRAST
TECHNIQUE: Multidetector CT imaging of the abdomen and pelvis was performed
following the standard protocol without IV contrast.

[Series 2: axial st · axial · 0.88mm/px · z∈[-464,-30]mm · 13 of 97 slices shown, 15 images]
[im 5/97  soft-tissue]
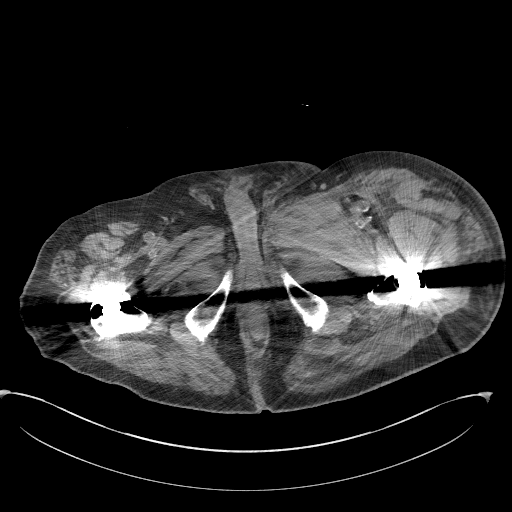
[im 5/97  bone]
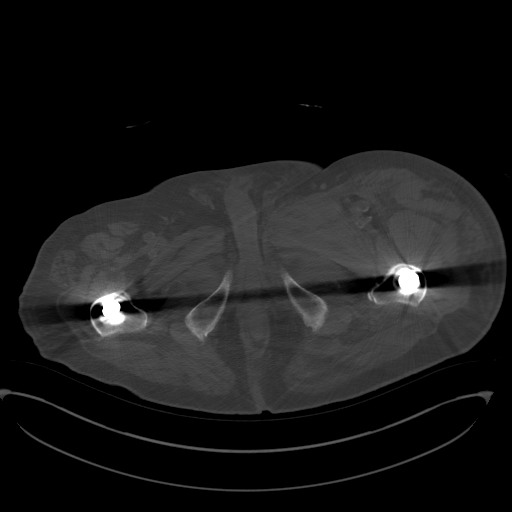
[im 14/97  soft-tissue]
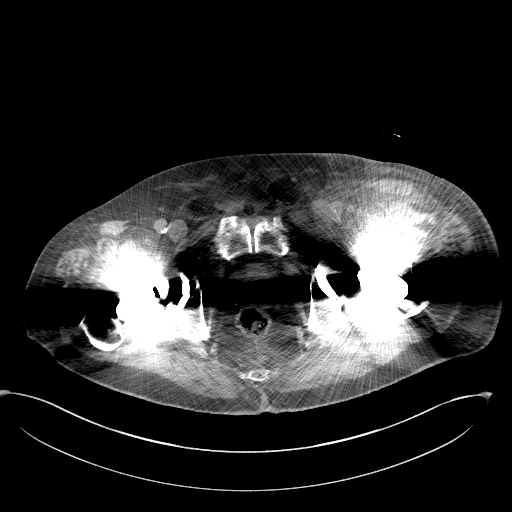
[im 19/97  soft-tissue]
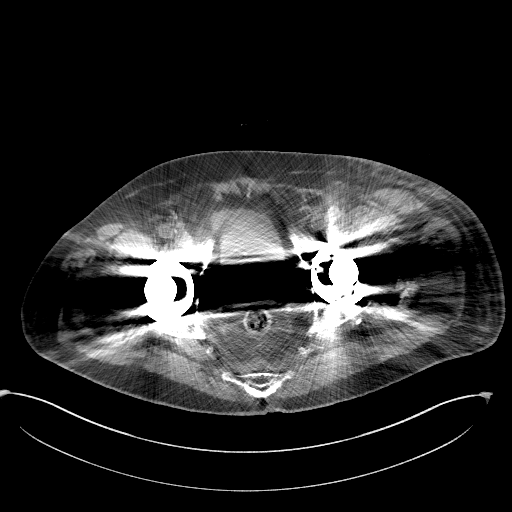
[im 28/97  soft-tissue]
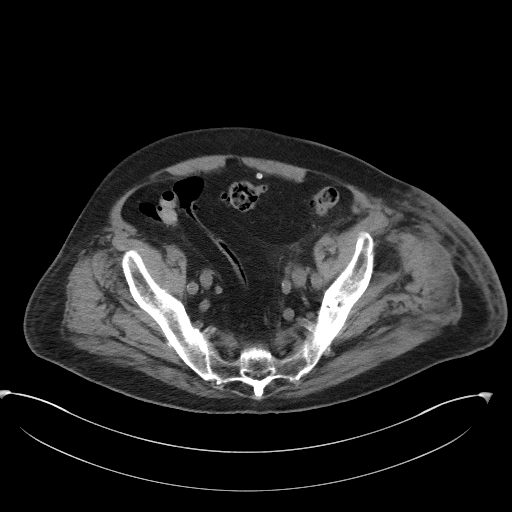
[im 33/97  soft-tissue]
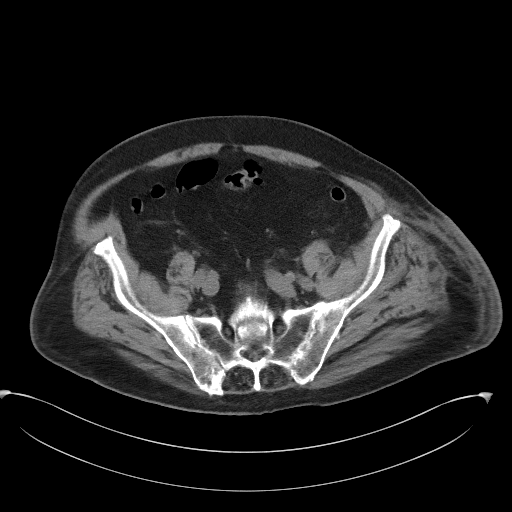
[im 42/97  soft-tissue]
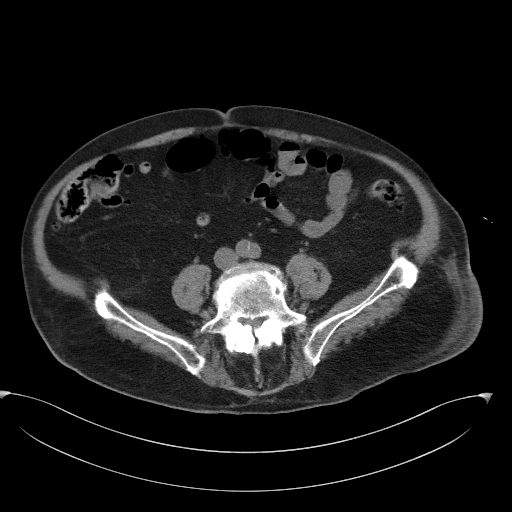
[im 51/97  soft-tissue]
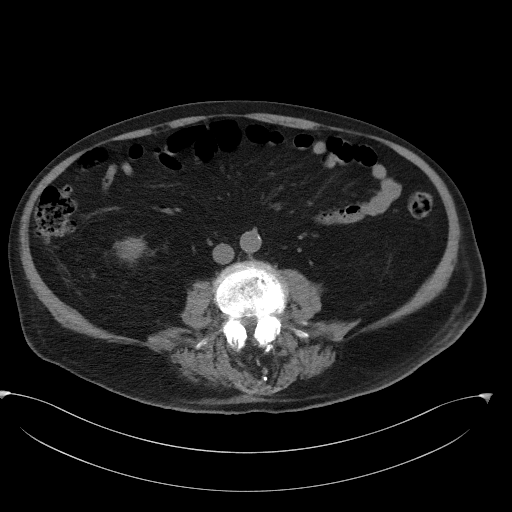
[im 55/97  soft-tissue]
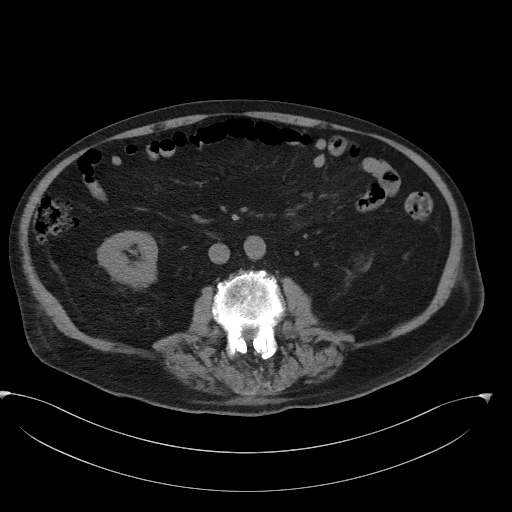
[im 65/97  soft-tissue]
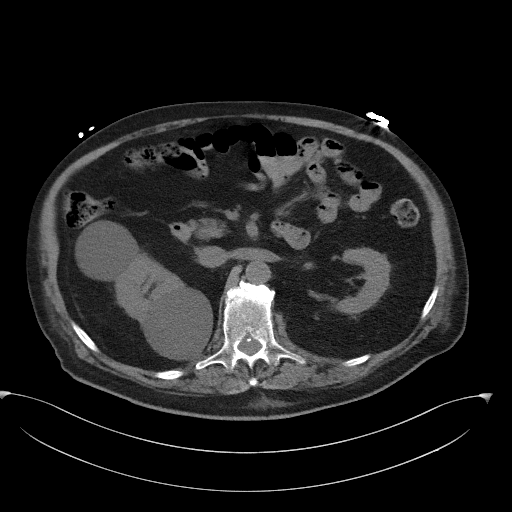
[im 65/97  bone]
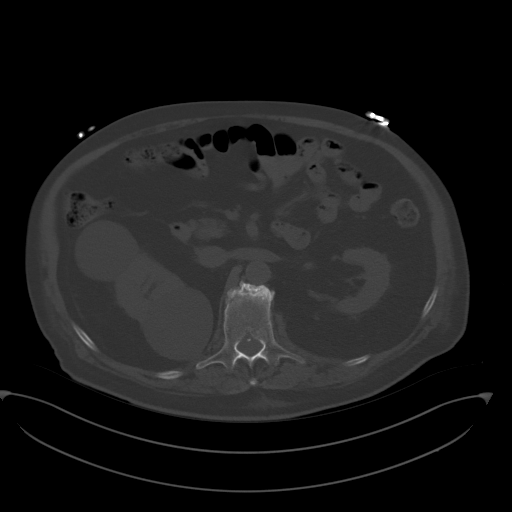
[im 69/97  soft-tissue]
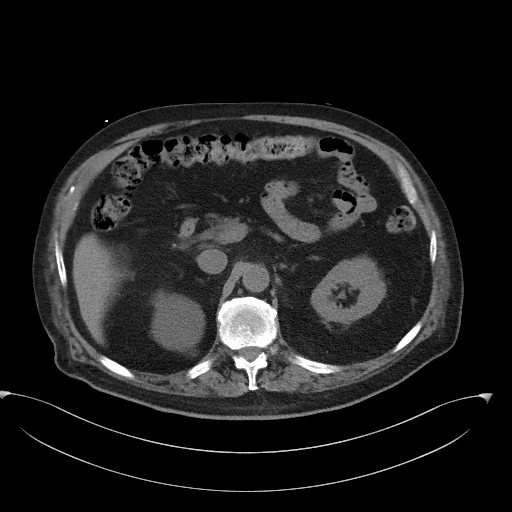
[im 78/97  soft-tissue]
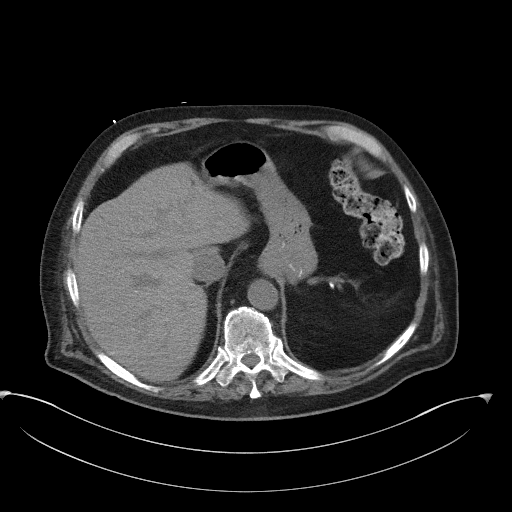
[im 83/97  soft-tissue]
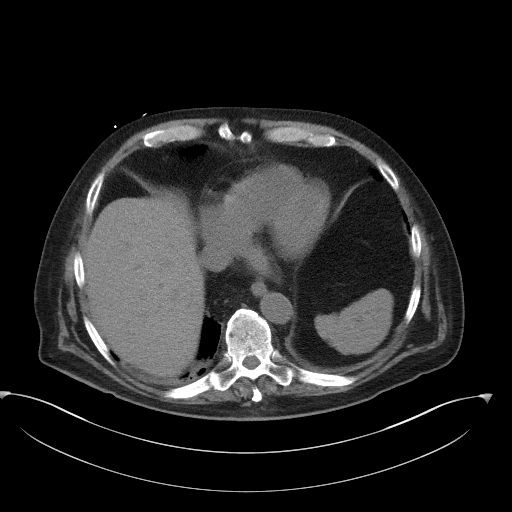
[im 92/97  soft-tissue]
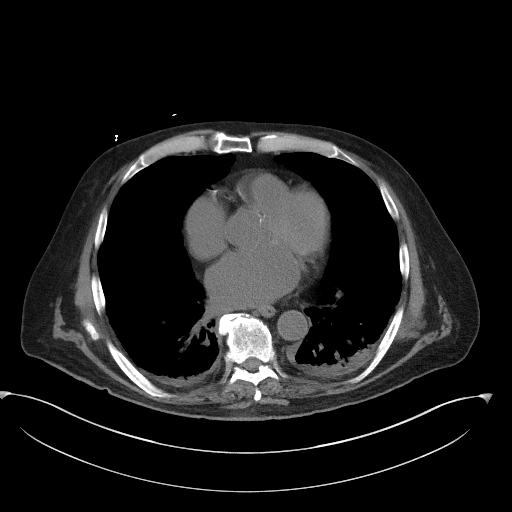

[Series 4: coronal st · coronal · 0.94mm/px · 3 of 101 slices shown]
[im 34/101  soft-tissue]
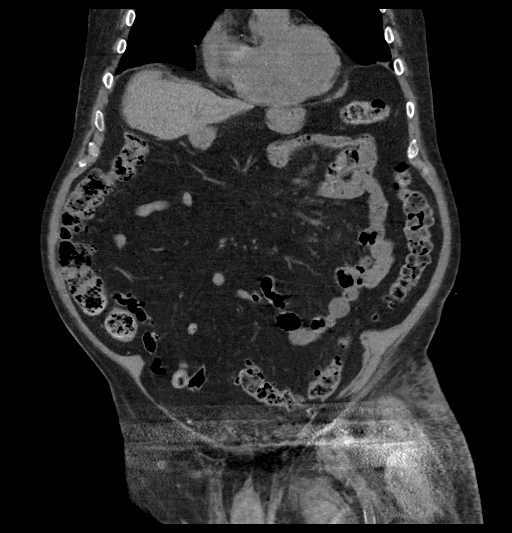
[im 45/101  soft-tissue]
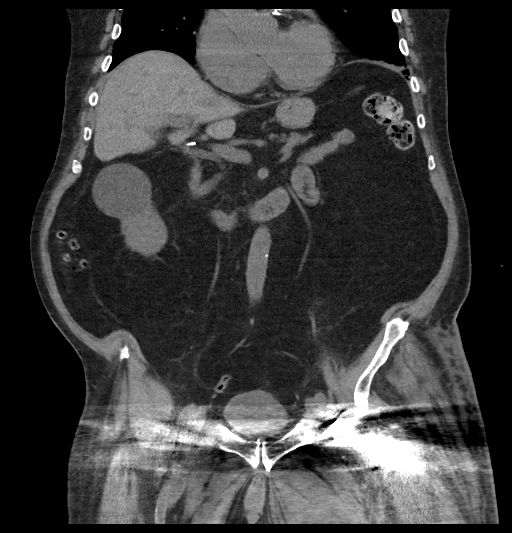
[im 56/101  soft-tissue]
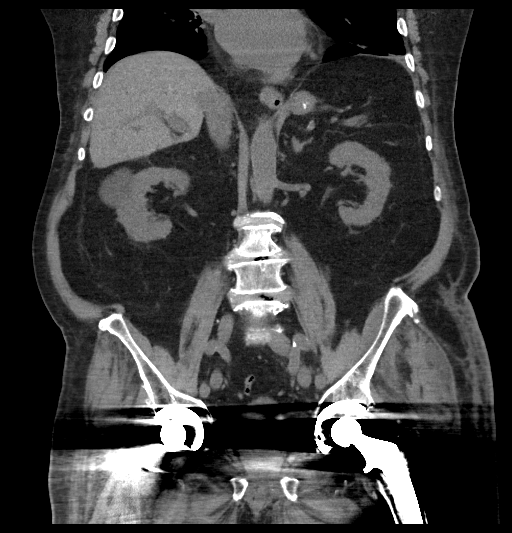

[16 of 46 positions shown; findings below may reference images not displayed]

FINDINGS: Lower chest: Coronary artery calcifications. Heart is normal size.
Bibasilar dependent atelectasis.

Hepatobiliary: Calcifications in the liver compatible with old
granulomatous disease. Prior cholecystectomy. No focal hepatic
abnormality.

Pancreas: Pancreatic atrophy. No focal abnormality or ductal
dilatation.

Spleen: No focal abnormality.  Normal size.

Adrenals/Urinary Tract: Large cysts in the upper pole of the right
kidney are unchanged since prior study. No hydronephrosis. No
adrenal or urinary bladder visible abnormality.

Stomach/Bowel: Sigmoid diverticulosis. Colonic diverticulosis, most
notable in the sigmoid colon and right colon. No active
diverticulitis. Stomach and small bowel decompressed.

Vascular/Lymphatic: Aortic atherosclerosis. No enlarged abdominal or
pelvic lymph nodes.

Reproductive: Obscured by beam hardening artifact from bilateral hip
replacements.

Other: No free fluid or free air.  No retroperitoneal hematoma.

Musculoskeletal: Bilateral hip replacements. No visible fracture.
Degenerative changes in the lumbar spine. There is stranding within
the subcutaneous soft tissues of the left hip and proximal thigh
compatible with hematoma.
IMPRESSION: No retroperitoneal hematoma. Stranding within the subcutaneous soft
tissues overlying the left hip compatible with superficial hematoma.
No visible bony abnormality.

Colonic diverticulosis.

Aortic atherosclerosis.
